# Patient Record
Sex: Male | Born: 1940 | Race: White | Hispanic: No | Marital: Married | Smoking: Former smoker
Health system: Southern US, Community
[De-identification: ages and names within clinical notes are randomized; demographics above are authoritative.]

## PROBLEM LIST (undated history)

## (undated) DIAGNOSIS — E782 Mixed hyperlipidemia: Secondary | ICD-10-CM

## (undated) DIAGNOSIS — E079 Disorder of thyroid, unspecified: Secondary | ICD-10-CM

## (undated) DIAGNOSIS — E039 Hypothyroidism, unspecified: Secondary | ICD-10-CM

## (undated) DIAGNOSIS — I209 Angina pectoris, unspecified: Secondary | ICD-10-CM

## (undated) DIAGNOSIS — Z125 Encounter for screening for malignant neoplasm of prostate: Secondary | ICD-10-CM

## (undated) DIAGNOSIS — I10 Essential (primary) hypertension: Secondary | ICD-10-CM

## (undated) DIAGNOSIS — Z0000 Encounter for general adult medical examination without abnormal findings: Secondary | ICD-10-CM

## (undated) DIAGNOSIS — J208 Acute bronchitis due to other specified organisms: Secondary | ICD-10-CM

## (undated) DIAGNOSIS — G500 Trigeminal neuralgia: Secondary | ICD-10-CM

## (undated) DIAGNOSIS — I25119 Atherosclerotic heart disease of native coronary artery with unspecified angina pectoris: Secondary | ICD-10-CM

## (undated) DIAGNOSIS — G250 Essential tremor: Secondary | ICD-10-CM

## (undated) DIAGNOSIS — J439 Emphysema, unspecified: Secondary | ICD-10-CM

## (undated) DIAGNOSIS — E785 Hyperlipidemia, unspecified: Secondary | ICD-10-CM

## (undated) DIAGNOSIS — K589 Irritable bowel syndrome without diarrhea: Secondary | ICD-10-CM

## (undated) DIAGNOSIS — K219 Gastro-esophageal reflux disease without esophagitis: Secondary | ICD-10-CM

## (undated) DIAGNOSIS — I2510 Atherosclerotic heart disease of native coronary artery without angina pectoris: Secondary | ICD-10-CM

## (undated) DIAGNOSIS — I429 Cardiomyopathy, unspecified: Secondary | ICD-10-CM

## (undated) DIAGNOSIS — K2210 Ulcer of esophagus without bleeding: Secondary | ICD-10-CM

## (undated) DIAGNOSIS — I5042 Chronic combined systolic (congestive) and diastolic (congestive) heart failure: Secondary | ICD-10-CM

## (undated) DIAGNOSIS — R911 Solitary pulmonary nodule: Secondary | ICD-10-CM

## (undated) DIAGNOSIS — R05 Cough: Secondary | ICD-10-CM

## (undated) DIAGNOSIS — I110 Hypertensive heart disease with heart failure: Secondary | ICD-10-CM

## (undated) DIAGNOSIS — I712 Thoracic aortic aneurysm, without rupture: Secondary | ICD-10-CM

## (undated) DIAGNOSIS — I219 Acute myocardial infarction, unspecified: Secondary | ICD-10-CM

## (undated) DIAGNOSIS — J189 Pneumonia, unspecified organism: Secondary | ICD-10-CM

## (undated) HISTORY — DX: Atherosclerotic heart disease of native coronary artery with unspecified angina pectoris: I25.119

## (undated) HISTORY — DX: Hypothyroidism, unspecified: E03.9

## (undated) HISTORY — DX: Atherosclerotic heart disease of native coronary artery without angina pectoris: I25.10

## (undated) HISTORY — DX: Solitary pulmonary nodule: R91.1

## (undated) HISTORY — PX: TOTAL SHOULDER REPLACEMENT: SUR1217

## (undated) HISTORY — DX: Acute myocardial infarction, unspecified: I21.9

## (undated) HISTORY — DX: Cough: R05

## (undated) HISTORY — DX: Encounter for general adult medical examination without abnormal findings: Z00.00

## (undated) HISTORY — DX: Ulcer of esophagus without bleeding: K22.10

## (undated) HISTORY — DX: Irritable bowel syndrome without diarrhea: K58.9

## (undated) HISTORY — DX: Emphysema, unspecified: J43.9

## (undated) HISTORY — DX: Thoracic aortic aneurysm, without rupture: I71.2

## (undated) HISTORY — DX: Trigeminal neuralgia: G50.0

## (undated) HISTORY — DX: Hypertensive heart disease with heart failure: I11.0

## (undated) HISTORY — DX: Acute bronchitis due to other specified organisms: J20.8

## (undated) HISTORY — DX: Chronic combined systolic (congestive) and diastolic (congestive) heart failure: I50.42

## (undated) HISTORY — DX: Encounter for screening for malignant neoplasm of prostate: Z12.5

## (undated) HISTORY — PX: CHOLECYSTECTOMY: SHX55

## (undated) HISTORY — DX: Pneumonia, unspecified organism: J18.9

## (undated) HISTORY — DX: Angina pectoris, unspecified: I20.9

## (undated) HISTORY — DX: Cardiomyopathy, unspecified: I42.9

## (undated) HISTORY — PX: TOTAL KNEE ARTHROPLASTY: SHX125

## (undated) HISTORY — DX: Essential tremor: G25.0

## (undated) HISTORY — DX: Disorder of thyroid, unspecified: E07.9

## (undated) HISTORY — PX: CORONARY ANGIOPLASTY WITH STENT PLACEMENT: SHX49

## (undated) HISTORY — PX: CORONARY ARTERY BYPASS GRAFT: SHX141

## (undated) HISTORY — DX: Hyperlipidemia, unspecified: E78.5

## (undated) HISTORY — DX: Mixed hyperlipidemia: E78.2

---

## 2002-12-12 ENCOUNTER — Inpatient Hospital Stay (HOSPITAL_COMMUNITY): Admission: AD | Admit: 2002-12-12 | Discharge: 2002-12-13 | Payer: Self-pay | Admitting: Neurosurgery

## 2003-12-03 ENCOUNTER — Ambulatory Visit: Payer: Self-pay | Admitting: Family Medicine

## 2004-03-03 ENCOUNTER — Ambulatory Visit: Payer: Self-pay | Admitting: Family Medicine

## 2004-03-31 ENCOUNTER — Ambulatory Visit: Payer: Self-pay | Admitting: Family Medicine

## 2004-04-21 ENCOUNTER — Ambulatory Visit: Payer: Self-pay | Admitting: Family Medicine

## 2004-08-22 ENCOUNTER — Ambulatory Visit: Payer: Self-pay | Admitting: Family Medicine

## 2004-12-12 ENCOUNTER — Ambulatory Visit: Payer: Self-pay | Admitting: Family Medicine

## 2005-02-04 ENCOUNTER — Ambulatory Visit: Payer: Self-pay | Admitting: Family Medicine

## 2007-06-13 ENCOUNTER — Inpatient Hospital Stay (HOSPITAL_COMMUNITY): Admission: RE | Admit: 2007-06-13 | Discharge: 2007-06-15 | Payer: Self-pay | Admitting: Orthopedic Surgery

## 2007-06-20 ENCOUNTER — Emergency Department (HOSPITAL_COMMUNITY): Admission: EM | Admit: 2007-06-20 | Discharge: 2007-06-20 | Payer: Self-pay | Admitting: Emergency Medicine

## 2007-06-20 ENCOUNTER — Ambulatory Visit: Payer: Self-pay | Admitting: Vascular Surgery

## 2007-06-20 ENCOUNTER — Encounter (INDEPENDENT_AMBULATORY_CARE_PROVIDER_SITE_OTHER): Payer: Self-pay | Admitting: Emergency Medicine

## 2008-12-18 ENCOUNTER — Encounter: Admission: RE | Admit: 2008-12-18 | Discharge: 2008-12-18 | Payer: Self-pay | Admitting: Orthopedic Surgery

## 2009-02-08 ENCOUNTER — Inpatient Hospital Stay (HOSPITAL_COMMUNITY): Admission: RE | Admit: 2009-02-08 | Discharge: 2009-02-10 | Payer: Self-pay | Admitting: Orthopedic Surgery

## 2010-04-13 LAB — BASIC METABOLIC PANEL
BUN: 11 mg/dL (ref 6–23)
BUN: 16 mg/dL (ref 6–23)
BUN: 8 mg/dL (ref 6–23)
CO2: 25 mEq/L (ref 19–32)
CO2: 28 mEq/L (ref 19–32)
CO2: 24 mEq/L (ref 19–32)
Calcium: 8.2 mg/dL — ABNORMAL LOW (ref 8.4–10.5)
Calcium: 9.2 mg/dL (ref 8.4–10.5)
Calcium: 8.1 mg/dL — ABNORMAL LOW (ref 8.4–10.5)
Chloride: 105 mEq/L (ref 96–112)
Chloride: 105 mEq/L (ref 96–112)
Chloride: 106 mEq/L (ref 96–112)
Creatinine, Ser: 0.82 mg/dL (ref 0.4–1.5)
Creatinine, Ser: 0.96 mg/dL (ref 0.4–1.5)
Creatinine, Ser: 0.93 mg/dL (ref 0.4–1.5)
GFR calc Af Amer: >60 mL/min (ref >60)
GFR calc Af Amer: >60 mL/min (ref >60)
GFR calc Af Amer: >60 mL/min (ref >60)
GFR calc non Af Amer: >60 mL/min (ref >60)
GFR calc non Af Amer: >60 mL/min (ref >60)
GFR calc non Af Amer: >60 mL/min (ref >60)
Glucose, Bld: 109 mg/dL — ABNORMAL HIGH (ref 70–99)
Glucose, Bld: 138 mg/dL — ABNORMAL HIGH (ref 70–99)
Glucose, Bld: 146 mg/dL — ABNORMAL HIGH (ref 70–99)
Potassium: 3.8 mEq/L (ref 3.5–5.1)
Potassium: 4.3 mEq/L (ref 3.5–5.1)
Potassium: 3.5 mEq/L (ref 3.5–5.1)
Sodium: 137 mEq/L (ref 135–145)
Sodium: 139 mEq/L (ref 135–145)
Sodium: 136 mEq/L (ref 135–145)

## 2010-04-13 LAB — CBC
HCT: 34.1 % — ABNORMAL LOW (ref 39.0–52.0)
HCT: 45 % (ref 39.0–52.0)
HCT: 32.1 % — ABNORMAL LOW (ref 39.0–52.0)
Hemoglobin: 12.2 g/dL — ABNORMAL LOW (ref 13.0–17.0)
Hemoglobin: 15.9 g/dL (ref 13.0–17.0)
Hemoglobin: 11.5 g/dL — ABNORMAL LOW (ref 13.0–17.0)
MCHC: 35.2 g/dL (ref 30.0–36.0)
MCHC: 35.7 g/dL (ref 30.0–36.0)
MCHC: 35.8 g/dL (ref 30.0–36.0)
MCV: 96.2 fL (ref 78.0–100.0)
MCV: 96.4 fL (ref 78.0–100.0)
MCV: 95.7 fL (ref 78.0–100.0)
Platelets: 122 10*3/uL — ABNORMAL LOW (ref 150–400)
Platelets: 178 10*3/uL (ref 150–400)
Platelets: 114 10*3/uL — ABNORMAL LOW (ref 150–400)
RBC: 3.55 MIL/uL — ABNORMAL LOW (ref 4.22–5.81)
RBC: 4.67 MIL/uL (ref 4.22–5.81)
RBC: 3.36 MIL/uL — ABNORMAL LOW (ref 4.22–5.81)
RDW: 14.5 % (ref 11.5–15.5)
RDW: 14.5 % (ref 11.5–15.5)
RDW: 14.8 % (ref 11.5–15.5)
WBC: 5.9 10*3/uL (ref 4.0–10.5)
WBC: 7.9 10*3/uL (ref 4.0–10.5)
WBC: 7.4 10*3/uL (ref 4.0–10.5)

## 2010-04-13 LAB — URINALYSIS, ROUTINE W REFLEX MICROSCOPIC
Glucose, UA: NEGATIVE mg/dL
Hgb urine dipstick: NEGATIVE
Ketones, ur: NEGATIVE mg/dL
Nitrite: NEGATIVE
Protein, ur: NEGATIVE mg/dL
Specific Gravity, Urine: 1.029 (ref 1.005–1.030)
Urobilinogen, UA: 0.2 mg/dL (ref 0.0–1.0)
pH: 5.5 (ref 5.0–8.0)

## 2010-04-13 LAB — DIFFERENTIAL
Basophils Absolute: 0 10*3/uL (ref 0.0–0.1)
Basophils Relative: 1 % (ref 0–1)
Eosinophils Absolute: 0.3 10*3/uL (ref 0.0–0.7)
Eosinophils Relative: 6 % — ABNORMAL HIGH (ref 0–5)
Lymphocytes Relative: 21 % (ref 12–46)
Lymphs Abs: 1.2 10*3/uL (ref 0.7–4.0)
Monocytes Absolute: 0.7 10*3/uL (ref 0.1–1.0)
Monocytes Relative: 11 % (ref 3–12)
Neutro Abs: 3.7 10*3/uL (ref 1.7–7.7)
Neutrophils Relative %: 62 % (ref 43–77)

## 2010-04-13 LAB — PROTIME-INR
INR: 1.02 (ref 0.00–1.49)
Prothrombin Time: 13.3 seconds (ref 11.6–15.2)

## 2010-04-13 LAB — TYPE AND SCREEN
ABO/RH(D): A POS
Antibody Screen: NEGATIVE

## 2010-04-13 LAB — APTT: aPTT: 32 seconds (ref 24–37)

## 2010-06-10 NOTE — H&P (Signed)
NAME:  GUMARO, BRIGHTBILL NO.:  [id number]   MEDICAL RECORD NO.:  [id number]         PATIENT TYPE:  LINP   LOCATION:                               FACILITY:  [facility]   PHYSICIAN:  Madlyn Frankel. Charlann Boxer, M.D.  DATE OF BIRTH:  [birth year]   DATE OF ADMISSION:  06/13/2007  DATE OF DISCHARGE:                              HISTORY & PHYSICAL   PROCEDURE:  Left total knee arthroplasty.   CHIEF COMPLAINT:  Left knee pain.   HISTORY OF PRESENT ILLNESS:  Mr. Ian Malkin is a very pleasant 70 year old  male with a history of left knee pain secondary to osteoarthritis.  It  has been refractory to all conservative treatment, including anti-  inflammatories and cortisone injections.  He has diminished quality of  life.  He has been presurgically assessed and evaluated with full  cardiology workup by Dr. Norman Herrlich prior to surgery.   PAST MEDICAL HISTORY:  Significant for:  1. Osteoarthritis.  2. Congestive heart failure.  3. Coronary artery disease with bypass graft.  4. Gallbladder disease.  5. Hypothyroidism.   PAST SURGICAL HISTORY:  Coronary artery bypass graft in 1997.   FAMILY HISTORY:  Noncontributory.   SOCIAL HISTORY:  Married.  Primary caregiver will be his wife after  surgery.   DRUG ALLERGIES:  No known drug allergies.   MEDICATIONS CURRENTLY:  1. Altace 10 mg p.o. daily.  2. Coreg 25 mg 1/2 tablet daily.  3. Zocor 10 mg p.o. daily.  4. Synthroid 100 mcg 1 p.o. daily.  5. Klor-Con 20 mEq 2 p.o. daily.  6. Lasix 40 mg 1 p.o. daily.  7. Aspirin 81 mg 1 p.o. daily.  8. Protonix 40 mg 1 p.o. daily.  9. Celebrex 200 mg 1 p.o. b.i.d. x2 weeks postoperatively.   REVIEW OF SYSTEMS:  None other than HPI.   PHYSICAL EXAMINATION:  VITAL SIGNS:  This is a 6 foot, 3 inch, 215-pound  male.  His pulse is 72, respirations 18, blood pressure 128/82.  GENERAL:  Awake, alert and oriented, well-developed, well-nourished, in  no acute distress.  NECK:  Supple.  No carotid  bruits.  CHEST/LUNGS:  Clear to auscultation bilaterally.  BREASTS:  Deferred.  HEART:  Regular rate and rhythm.  S1 and S2 distinct.  ABDOMEN:  Soft, nontender, nondistended.  Bowel sounds present.  GENITOURINARY:  Deferred.  EXTREMITIES:  Comes out to full extension, flexes back to 120 degrees.  Dorsalis pedis pulse positive.  NEUROLOGIC:  Intact distal sensibilities.   Labs are pending.   EKG has a sinus rhythm with a remote history of anteroseptal infarct.   Chest x-ray is pending.   IMPRESSION:  Left knee osteoarthritis.   PLAN OF ACTION:  Left total knee arthroplasty, [facility], Jun 13, 2007, by surgeon Dr. Durene Romans.   Postoperative recommendations include potential for telemetry, cardiac  enzymes, and EKG on postop day #1.  If asymptomatic, treat  conservatively.  If need for cardiology consult, prefer [place].  Risks  and complications were discussed.  Questions were encouraged,  answered  and reviewed.   Postoperative medications were  provided at time of history and physical,  including Lovenox, Robaxin, iron, aspirin, Colace, MiraLax.  We also  provided Celebrex for perioperative use.     ______________________________  Yetta Glassman. [address], [place]      Madlyn Frankel. Charlann Boxer, M.D.  Electronically Signed    BLM/MEDQ  D:  06/08/2007  T:  06/08/2007  Job:  [id number]   cc:   Dina Rich  Fax: [phone]   Dr. Norman Herrlich

## 2010-06-10 NOTE — Op Note (Signed)
NAME:  Jacob Weber, Jacob Weber NO.:  [id number]   MEDICAL RECORD NO.:  [id number]          PATIENT TYPE:  INP   LOCATION:  0001                         FACILITY:  [facility]   PHYSICIAN:  Madlyn Frankel. Charlann Boxer, M.D.  DATE OF BIRTH:  March 25, 1940   DATE OF PROCEDURE:  06/13/2007  DATE OF DISCHARGE:                               OPERATIVE REPORT   PREOPERATIVE DIAGNOSIS:  Left knee osteoarthritis.   POSTOPERATIVE DIAGNOSIS:  Left knee osteoarthritis.   PROCEDURE:  Left total knee replacement.   COMPONENTS USED:  DePuy rotating platform, posterior stabilized knee  system, size 5 femur, 4 tibia, 12.5 insert and a 41 patellar button.   SURGEON:  Madlyn Frankel. Charlann Boxer, M.D.   ASSISTANT:  Surgical tech.   ANESTHESIA:  Duramorph spinal.   COMPLICATIONS:  None.   DRAINS:  One.   TOURNIQUET TIME:  51 minutes at 250 mmHg.   INDICATIONS:  Mr. Bazen is a 70 year old patient whom I have been  following for some time for bilateral, left greater than right, knee end-  stage osteoarthritis.  He had failed conservative measures and had  decreased quality of life and at this point wished to proceed with knee  replacement.  We reviewed the risks and benefits of the procedure, the  postoperative course and expectations for potential stiffness if therapy  is not carried through, in addition to the risks of infection, DVT,  component failure, need for revision surgery.  Consent was obtained.   PROCEDURE IN DETAIL:  The patient was brought to the operative theater.  Once adequate anesthesia and preoperative antibiotics, Ancef,  administered the patient was positioned supine with a left proximal  thigh tourniquet placed.  The left lower extremity was then prescrubbed  and prepped and draped in sterile fashion.  A near midline incision was  made to avoid the prominent tibial tubercle.  Median arthrotomy was then  created patella subluxation was carried out.  Following initial  debridement, I attended  to the patella, first precut measure was 26 mm.  I resected down to 14-15 mm, then used a 41 patellar button.  Lug holes  were drilled, a metal shim was placed to protect the patella from the  retractors.  Following further debride, attention was directed to the  femur.  Femoral drill was passed.  Canal irrigated to try to prevent fat  emboli.  Intramedullary rod was then passed.  At 5 degrees valgus I  resected 10 mm off the distal femur.  I then sized the femur to be a  size 5, it was slightly larger in the anterior-posterior dimension, then  a 5 pinned in that position.  Checked it with a crab claw to make sure  that there was no notching.  There was none present.  The anterior and  posterior chamfer cuts were then made based off the posterior condylar  axis which correlated to the perpendicular to Whiteside's line.   The final box cut was made based off the lateral aspect of distal femur.  Attention now directed to the tibia.  The tibia was subluxated  anteriorly, further meniscectomy  was carried out using extramedullary  guide, I resected 10 mm of bone off the proximal lateral tibia.  I  brought the knee out to extension with extension block and was happy the  knee came out to extension.   At this point, the final preparation of the tibia was carried out.  Based on the dimensions of the tibia, I used a size 4.  He had medial  tibial osteophyte and I ended up debriding this back, recontouring the  medial side and lateralizing the tibia.  This helped with overall  tracking in tibial position.  I pinned the tibial tray in place,  checking with alignment rod and satisfied that the cut was perpendicular  in both planes.  I then drilled and keel punched the tibia.  Trial  reduction was now carried out with a 5 femur, 10 mm insert, 4 tibia and  a 12.5 insert.  The knee came out to full extension with stable  ligaments from extension to flexion.  The patella tracked without any   application of pressure.  Trial components were removed.  Further  debridements carried out as necessary.  I injected the synovial capsule  layer with 60 mL of 25% Marcaine with epinephrine and 1 mL of Toradol.  I then irrigated the knee out with normal saline solution pulse lavage.  The final components were opened and cement mixed.   Final components were then cemented in position.  The knee was brought  to extension with 12.5 insert.  Extruded cement was removed.  Once  cement cured, I removed the remaining cement that was visualized.  The  final 12.5 insert was placed.  The knee was brought to extension.  The  knee reirrigated.  At this point, a medium Hemovac drain was placed  deep.  Tourniquet was let down at 51 minutes.  There was no significant  hemostasis required.  The knee was brought to flexion and with the knee  at 90 degrees of flexion, I reapproximated the extensor mechanism using  #1 Vicryl.  The remaining wound was closed in layers with 2-0 Vicryl and  running 4-0 Monocryl.  The knee was then cleaned, dried and  dressed  sterilely with Steri-Strips and a bulky sterile dressing.  He was  brought to the recovery room in stable condition, tolerating the  procedure well.      Madlyn Frankel Charlann Boxer, M.D.  Electronically Signed     MDO/MEDQ  D:  06/13/2007  T:  06/13/2007  Job:  [id number]

## 2010-06-13 NOTE — Discharge Summary (Signed)
NAME:  Jacob Weber, Jacob Weber NO.:  [id number]   MEDICAL RECORD NO.:  [id number]                   PATIENT TYPE:  INP   LOCATION:  3113                                 FACILITY:  [facility]   PHYSICIAN:  Danae Orleans. Venetia Maxon, M.D.               DATE OF BIRTH:  07-Mar-1940   DATE OF ADMISSION:  12/12/2002  DATE OF DISCHARGE:  12/13/2002                                 DISCHARGE SUMMARY   REASON FOR ADMISSION:  Subarachnoid hemorrhage, altered level of  consciousness, status post aortocoronary bypass, unspecified fall accident,  residence for institution.   FINAL DIAGNOSES:  1. Subarachnoid hemorrhage.  2. Altered level of consciousness.  3. Status post aortocoronary bypass.  4. Unspecified fall accident.  5. Residence for institution.   HISTORY OF PRESENT ILLNESS AND HOSPITAL COURSE:  Jacob Weber is a 70-year-  old man who fell and hit his head with a loss of consciousness.  He had a  small amount of subarachnoid blood on head CT.  Was transferred from  [facility] for further care.  He was observed in the hospital and was  doing well on February 12, 2003.  Head CT was stable, and he was discharged  hom at that point with instructions to follow up with me in my office in  four weeks.   DISCHARGE MEDICATIONS:  1. Regular medications.  2. Tylenol for headaches.   FINAL DIAGNOSES:  1. Subarachnoid hemorrhage.  2. Altered level of consciousness.  3. Status post aortocoronary bypass.  4. Unspecified fall accident.  5. Residence for institution.   CONDITION ON DISCHARGE:  Stable.                                                Danae Orleans. Venetia Maxon, M.D.    JDS/MEDQ  D:  02/19/2003  T:  02/20/2003  Job:  [id number]

## 2010-06-13 NOTE — Discharge Summary (Signed)
NAME:  Jacob Weber, Jacob Weber NO.:  [id number]   MEDICAL RECORD NO.:  [id number]          PATIENT TYPE:  EMS   LOCATION:  MINO                         FACILITY:  [facility]   PHYSICIAN:  Madlyn Frankel. Charlann Boxer, M.D.  DATE OF BIRTH:  [birth year]   DATE OF ADMISSION:  06/20/2007  DATE OF DISCHARGE:  06/20/2007                               DISCHARGE SUMMARY   ADMITTING DIAGNOSES:  1. Osteoarthritis.  2. Congestive heart failure.  3. Coronary artery disease with bypass graft.  4. Gallbladder disease.  5. Hypothyroidism.   DISCHARGE DIAGNOSIS:  1. Osteoarthritis.  2. Congestive heart failure.  3. Coronary artery disease with bypass graft.  4. Gallbladder disease.  5. Hypothyroidism.   HISTORY OF PRESENT ILLNESS:  Jacob Weber is a 70 year old male with  history of left knee pain secondary to osteoarthritis.  It is refractory  to all conservative treatment including oral anti-inflammatory and  cortisone injections.  Was pre-surgically assessed and evaluated with  full cardiology workup prior surgery by Dr. Norman Herrlich.   PROCEDURE:  Left total knee arthroplasty by surgeon Dr. Durene Romans.   CONSULTATIONS:  None.   LABS:  Preadmission CBC:  Hemoglobin and hematocrit respectively were  15.6 and 45.2+.  Platelets were 205,000.  At the time of discharge  hemoglobin 9.9, hematocrit 28, platelets 123,000.  White cell  differential within normal limits.  Coags all within normal limits.  Routine chemistry on admission:  Sodium 140, potassium 4.8, glucose 101,  creatinine 0.97.  Time of discharge:  Sodium 139, potassium 4.1, glucose  128, creatinine 0.87.  Kidney function GFR greater than 60 with calcium  8.2 at discharge.  UA was negative.   CARDIOLOGY:  Full cardiology workup by Dr. Dulce Sellar prior to surgery  showed sinus rhythm with remote anteroseptal infarction.   No chest x-ray found in chart.   HOSPITAL COURSE:  The patient admitted to hospital, underwent left total  knee  replacement by Dr. Charlann Boxer, admitted to orthopedic floor.  The patient  did well with physical therapy made immediately excellent progress.  He  remained afebrile throughout.  Hemodynamically stable throughout.  The  Hemovac was DC'd day 1.  Dressing was changed after day 1 with no  significant drainage from the wound.  He remained neurovascularly intact  and was able to do straight leg raise prior to discharge.  He was doing  excellent on day 2.  Had done physical therapy twice and wanted to go  ahead and be discharged and orthopedically and hemodynamically he was  stable enough to do this.   DISCHARGE DISPOSITION:  Discharged home in stable and improved  condition.   DISCHARGE DIET:  Regular.   DISCHARGE WOUND CARE:  Keep wound dry.   DISCHARGE PHYSICAL THERAPY:  Weightbearing as tolerated with the use of  rolling walker.  Range of motion 0-100 for 2 weeks 0-120 for 6 weeks.   DISCHARGE MEDICATIONS:  1. Lovenox 40 mg subcutaneous q. 24 hours times 11 days.  2. Enteric-coated aspirin 325 mg one p.o. daily after Lovenox      completed.  3. Robaxin 500 mg  p.o. q. 6 muscle spasm pain.  4. Iron 325 mg 1 p.o. t.i.d. x2 weeks.  5. Colace 100 mg p.o. b.i.d.  6. MiraLax 17 grams p.o. daily.  7. Celebrex 2 tablets p.o. b.i.d. x2 weeks postoperatively.  8. Vicodin 5/325 one to two p.o. q. 4 to 6 p.r.n. pain.  9. Simvastatin 10 mg 1 p.o. q.p.m.  10.Carvedilol 25 mg half tab p.o. q. a.m.  11.Ramipril 10 mg one p.o. q.p.m.  12.Potassium chloride 20 mEq one p.o. q. a.m.  13.Furosemide 40 mg one p.o. q. a.m.  14.Protonix 40 mg one p.o. q. a.m.  15.Synthroid 0.1 mg one p.o. q.p.m.   DISCHARGE FOLLOWUP:  Follow with Dr. Charlann Boxer at phone number [phone].     ______________________________  Yetta Glassman. [address], [place]      Madlyn Frankel. Charlann Boxer, M.D.  Electronically Signed    BLM/MEDQ  D:  07/19/2007  T:  07/19/2007  Job:  [id number]   cc:   Dina Rich  Fax: [phone]   Dr. Norman Herrlich

## 2010-06-13 NOTE — H&P (Signed)
NAME:  Jacob Weber, Jacob Weber NO.:  [id number]   MEDICAL RECORD NO.:  [id number]                   PATIENT TYPE:  INP   LOCATION:  3113                                 FACILITY:  [facility]   PHYSICIAN:  Danae Orleans. Venetia Maxon, M.D.               DATE OF BIRTH:  11-19-1940   DATE OF ADMISSION:  12/12/2002  DATE OF DISCHARGE:                                HISTORY & PHYSICAL   CHIEF COMPLAINT:  Subarachnoid hemorrhage.   HISTORY OF ILLNESS:  Jacob Weber is a 70 year old man, who fell and hit  his head when he was pushing his 71-year-old grandson on a tire swing and the  nylon rope broke.  He fell and struck the left side of his head in the  posterior auricular region.  He had a positive loss of consciousness.  He  was taken to [facility] where a head CT evaluation demonstrated a  small amount of subarachnoid blood without midline shift or significant mass  effect.  He was transferred from [facility] for further care.  He has  had no nausea, vomiting, weakness, numbness, seizures, neck pain.  He has  had a cold for two weeks and has been on two rounds of antibiotics without  resolution of his symptoms.   PAST MEDICAL HISTORY:  1. Coronary artery bypass graft in the past.  2. Hypothyroid.  3. Elevated cholesterol.   MEDICATIONS:  1. Aspirin 81 mg daily.  2. Synthroid 0.1 mg daily.  3. Altace 10 mg daily.  4. Zocor 10 mg daily.  5. Lexapro 10 mg daily.   ALLERGIES:  He has no known drug allergies.   PHYSICAL EXAMINATION:  VITAL SIGNS:  He has a blood pressure of 144/75,  pulse 88, respiratory rate 22, temperature 97.5.  GENERAL:  He is awake and fully oriented.  HEENT:  He has no neck pain or headache.  He has no bruising over his  calvarium.  His pupils are round and reactive to light.  Extraocular  movements are intact.  NEUROLOGIC:  He moves all extremities without any drift.  He has full  bilateral upper extremity strength and all motor groups  bilaterally  symmetric.  He has no numbness in his upper or lower extremities.  He has no  evidence of pathologic reflexes.  On remainder of his examination, he has no  carotid bruits.  CHEST:  Bilaterally rhonchorous breath sounds with upper airway noise.  HEART:  Regular rate and rhythm without murmur.  ABDOMEN:  Soft, nontender, active bowel sounds, no hepatosplenomegaly  appreciated.  EXTREMITIES:  Without edema, clubbing, or cyanosis.   IMPRESSION:  Jacob Weber is a 70 year old man, who had a small amount of  subarachnoid hemorrhage following a fall in which he struck his head and had  a positive loss of consciousness.  He has a normal neurologic examination.  He will be observed in the neurologic intensive care  unit and will have a  repeat head CT in the morning.  Get a chest x-ray to rule out pneumonia.                                                Danae Orleans. Venetia Maxon, M.D.    JDS/MEDQ  D:  12/12/2002  T:  12/12/2002  Job:  [id number]

## 2010-10-22 LAB — BASIC METABOLIC PANEL
BUN: 11
BUN: 9
CO2: 25
CO2: 25
Calcium: 7.6 — ABNORMAL LOW
Calcium: 8.2 — ABNORMAL LOW
Chloride: 100
Chloride: 109
Creatinine, Ser: 0.87
Creatinine, Ser: 0.89
GFR calc Af Amer: >60
GFR calc Af Amer: >60
GFR calc non Af Amer: >60
GFR calc non Af Amer: >60
Glucose, Bld: 128 — ABNORMAL HIGH
Glucose, Bld: 136 — ABNORMAL HIGH
Potassium: 4.1
Potassium: 4.1
Sodium: 135
Sodium: 139

## 2010-10-22 LAB — CBC
HCT: 28 — ABNORMAL LOW
HCT: 31.6 — ABNORMAL LOW
Hemoglobin: 11.1 — ABNORMAL LOW
Hemoglobin: 9.9 — ABNORMAL LOW
MCHC: 35.1
MCHC: 35.2
MCV: 95.3
MCV: 95.9
Platelets: 123 — ABNORMAL LOW
Platelets: 141 — ABNORMAL LOW
RBC: 2.92 — ABNORMAL LOW
RBC: 3.32 — ABNORMAL LOW
RDW: 14.1
RDW: 14.4
WBC: 6.7
WBC: 8.3

## 2010-10-22 LAB — D-DIMER, QUANTITATIVE: D-Dimer, Quant: 2.25 — ABNORMAL HIGH

## 2010-12-05 ENCOUNTER — Emergency Department (HOSPITAL_COMMUNITY): Payer: Medicare Other

## 2010-12-05 ENCOUNTER — Encounter: Payer: Self-pay | Admitting: *Deleted

## 2010-12-05 ENCOUNTER — Emergency Department (HOSPITAL_COMMUNITY)
Admission: EM | Admit: 2010-12-05 | Discharge: 2010-12-05 | Disposition: A | Discharge status: Home / Self Care | Payer: Medicare Other | Attending: Emergency Medicine | Admitting: Emergency Medicine

## 2010-12-05 DIAGNOSIS — S61209A Unspecified open wound of unspecified finger without damage to nail, initial encounter: Secondary | ICD-10-CM | POA: Insufficient documentation

## 2010-12-05 DIAGNOSIS — S61219A Laceration without foreign body of unspecified finger without damage to nail, initial encounter: Secondary | ICD-10-CM

## 2010-12-05 DIAGNOSIS — I2510 Atherosclerotic heart disease of native coronary artery without angina pectoris: Secondary | ICD-10-CM | POA: Insufficient documentation

## 2010-12-05 DIAGNOSIS — Z7982 Long term (current) use of aspirin: Secondary | ICD-10-CM | POA: Insufficient documentation

## 2010-12-05 DIAGNOSIS — I10 Essential (primary) hypertension: Secondary | ICD-10-CM | POA: Insufficient documentation

## 2010-12-05 DIAGNOSIS — IMO0002 Reserved for concepts with insufficient information to code with codable children: Secondary | ICD-10-CM | POA: Insufficient documentation

## 2010-12-05 DIAGNOSIS — W268XXA Contact with other sharp object(s), not elsewhere classified, initial encounter: Secondary | ICD-10-CM | POA: Insufficient documentation

## 2010-12-05 DIAGNOSIS — Z79899 Other long term (current) drug therapy: Secondary | ICD-10-CM | POA: Insufficient documentation

## 2010-12-05 DIAGNOSIS — E039 Hypothyroidism, unspecified: Secondary | ICD-10-CM | POA: Insufficient documentation

## 2010-12-05 DIAGNOSIS — E7800 Pure hypercholesterolemia, unspecified: Secondary | ICD-10-CM | POA: Insufficient documentation

## 2010-12-05 HISTORY — DX: Disorder of thyroid, unspecified: E07.9

## 2010-12-05 HISTORY — DX: Gastro-esophageal reflux disease without esophagitis: K21.9

## 2010-12-05 HISTORY — DX: Atherosclerotic heart disease of native coronary artery without angina pectoris: I25.10

## 2010-12-05 HISTORY — DX: Essential (primary) hypertension: I10

## 2010-12-05 MED ORDER — LIDOCAINE HCL (PF) 1 % IJ SOLN
INTRAMUSCULAR | Status: AC
  Filled 2010-12-05: qty 10

## 2010-12-05 MED ORDER — BUPIVACAINE HCL (PF) 0.5 % IJ SOLN
INTRAMUSCULAR | Status: AC
  Filled 2010-12-05: qty 20

## 2010-12-05 NOTE — ED Notes (Signed)
Spoke with dr Sheran Fava staff and they aware pt is here and family and pt has been updated. They will call me back.

## 2010-12-05 NOTE — ED Notes (Signed)
PT cut R 4th and 5th digits today at noon.  Pt went to ED in [place] and they stated that he had a cut tendon and needed to see Hand specialist here at [facility].  Family has been in waiting room since 14:30 and are very frustrated.  Dressing to R hand will not be removed until specialist arrives. Pt denies pain and dressing dry and intact. PA Lemont Fillers has called Dr Sheran Fava and he is here in ED attending to another pt and he will see pt soon.  Family notifed.

## 2010-12-05 NOTE — ED Notes (Signed)
Spoke with dr Sheran Fava secretary, states he is sorry for delay and will see pt when he is out of surgery, will still attempt to have edp eval

## 2010-12-05 NOTE — ED Provider Notes (Signed)
8:40 PM Pt medically screened by me in ED. Pt already seen at urgent care and was sent here to be seen by Dr. Merlyn Lot. Pt with laceration to the right 4th and 5th fingers on a airconditioning unit. Suspected tendon laceration. Pt reports no current pain. Dr. Merlyn Lot already called and is here to see pt.   Pt in NAD, VS all within normal except for slightly elevated BP of 147/82. Regular HR and rhythm, lungs clear bilat. Right hand in bandage. Hemostatic. Pt asked for bandage not to be removed until Dr. Merlyn Lot removes it. Normal sensation to the tips of 4th and 5th fingers, good cap refill <2 sec.  Dr. Merlyn Lot is by bedside  Lottie Mussel, PA 12/05/10 2040  10:09 PM Pt's laceration and tendon injury repaired by Dr. Merlyn Lot, who asked for ortho techs to apply splint and d/c out of the system. Prescriptions and instructions for follow up given to the pt by him.  Lottie Mussel, [place] 12/05/10 2209

## 2010-12-05 NOTE — ED Notes (Signed)
Dr Sheran Fava at bedside.

## 2010-12-05 NOTE — ED Notes (Signed)
Dr Helene Kelp suturing wound.

## 2010-12-05 NOTE — ED Notes (Signed)
Pt reports laceration to right pinky and 4th finger by air conditioner cooler. Pt was sent to the ED by Dr Sheran Fava office to get started. Reports that Dr Merlyn Lot is coming to the ED and would like pt to have work up started in the ED.fingers are dressed at this time, pt states they told him his tendons were cut. Denies any decrease in sensation.

## 2010-12-05 NOTE — Consult Note (Signed)
NAMEMarland Kitchen  PEIGHTON, EDGIN NO.:  [id number]  MEDICAL RECORD NO.:  [id number]  LOCATION:  CDU07                        FACILITY:  [facility]  PHYSICIAN:  Betha Loa, MD        DATE OF BIRTH:  1940/12/03  DATE OF CONSULTATION:  12/05/2010 DATE OF DISCHARGE:  12/05/2010                                CONSULTATION   Consult is from emergency department.  Consult is for right small finger laceration with tendon involvement.  HISTORY:  Mr. Serviss is a 70 year old right-hand dominant male.  He is here with 2 family members.  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  He states this just an abrasion to the ring finger.  He went to an urgent care facility where the wound was irrigated and a dressing placed.  He was referred to me for further care.  He reports no previous injuries to the hand and no other injuries at this time.  ALLERGIES:  No known drug allergies.  PAST MEDICAL HISTORY:  Coronary artery disease, hypercholesterolemia, hypertension, hypothyroid.  PAST SURGICAL HISTORY:  Right total shoulder arthroplasty x2, left total knee arthroplasty, hernia repair, coronary artery bypass grafting.  MEDICATIONS: 1. Aspirin 81 mg. 2. Carvedilol. 3. Potassium. 4. Protonix. 5. Furosemide. 6. Synthroid. 7. Ramipril. 8. Crestor.  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  He does not use tobacco and does not use alcohol.  REVIEW OF SYSTEMS:  A 13-point review of systems negative.  PHYSICAL EXAMINATION:  GENERAL:  Alert and oriented x3, well developed, well nourished, normal gait. EXTREMITIES:  Bilateral upper extremities are intact to light touch Sensation and capillary refill to all fingertips.  He can flex and extend the IP joint of his thumbs and can cross his fingers.  Left upper  extremity is without wounds, without tenderness to palpation.  Right upper extremity with the exception of the ring and small finger have no wounds.  No tenderness to palpation.  In the ring finger, there is a small abrasion on the ulnar side of the finger.  This does not go into the subcutaneous tissues.  In the small finger there is a laceration with some skin loss on the dorsal aspect of the finger at the PIP joint and just proximally.  There is visible tendon laceration within it.  He has full extension of the finger and can maintain this against resistance.  He has intact flexion of the finger.  He has intact sensation on both the radial and ulnar sides.  RADIOGRAPHS:  AP, lateral, oblique views of the right hand show no fractures, dislocations, radiopaque foreign bodies.  ASSESSMENT AND PLAN:  Right ring and small finger lacerations with small finger extensor tendon involvement.  I discussed with Mr. Barro the nature of the injury.  I recommended irrigation and debridement of the wound and repair of the extensor tendon laceration in the emergency department.  Risks, benefits and alternatives of procedure were discussed including the risk of blood loss, infection, damage to  nerves, vessels, tendons, ligaments, bone, failure of procedure, need for additional procedures, complications, wound healing, continued pain, and stiffness.  He voiced understanding these risks and elected to proceed.  PROCEDURE NOTE:  Digital block was performed with 10 mL of half and half solution of 2% plain lidocaine and 0.5% plain Marcaine.  This was adequate to give total digital anesthesia to the small finger.  The wound was copiously irrigated with a 1000 mL of sterile saline and Betadine solution.  There was no gross contamination.  The finger was prepped with Betadine and sterilely draped with towels.  The wound was explored.  There is tendon laceration at the central slip.  This is a partial  injury.  On the more radial side, there was a small piece of bone from the base of the middle phalanx.  There were also some longitudinal lacerations at the ulnar side of the lateral bands.  The tendon lacerations were repaired with 4-0 Ethibond suture.  The small avulsion fragment was able to be tacked down while repairing the tendon. The skin was repaired using 4-0 nylon in an interrupted fashion.  There was some skin loss but the skin edges were able to be brought together. There was multiple laceration lines.  A Penrose drain was used as a tourniquet was up for approximately 30 minutes.  The wound was then dressed with sterile Xeroform.  The wound on the ring finger was then dressed with sterile Xeroform as well.  They were then dressed with sterile 2 x 2 and wrapped with a Kling bandage.  A volar splint was placed with the fingers in extension.  This was wrapped with Ace bandage.  Fingertips were pink with brisk capillary refill after placement of splint.  The Penrose drain had been removed.  I will give him Norco 5/325 1-2 p.o. q.6 hours p.r.n. pain, dispense #40 and Keflex 500 mg t.i.d. x1 week.  I will see him back in the office in 1 week for postprocedure followup.  Procedure was tolerated well.     Betha Loa, MD     KK/MEDQ  D:  12/05/2010  T:  12/05/2010  Job:  [id number]

## 2010-12-05 NOTE — Progress Notes (Signed)
Orthopedic Tech Progress Note Patient Details:  Jacob Weber September 19, 1940 [id number]  Type of Splint: Short Arm Splint Location: (R) UE Splint Interventions: Application    Jennye Moccasin 12/05/2010, 10:23 PM

## 2010-12-06 NOTE — ED Provider Notes (Signed)
Evaluation and management procedures were performed by the PA/NP under my supervision/collaboration.   Dione Booze, MD 12/06/10 [phone]

## 2013-04-11 IMAGING — CR DG HAND COMPLETE 3+V*R*
3 series · 3 of 3 positions shown · non-contrast
Comparison: None.

CLINICAL DATA: Fifth finger laceration.

RIGHT HAND - COMPLETE 3+ VIEW

[x hand pa right]
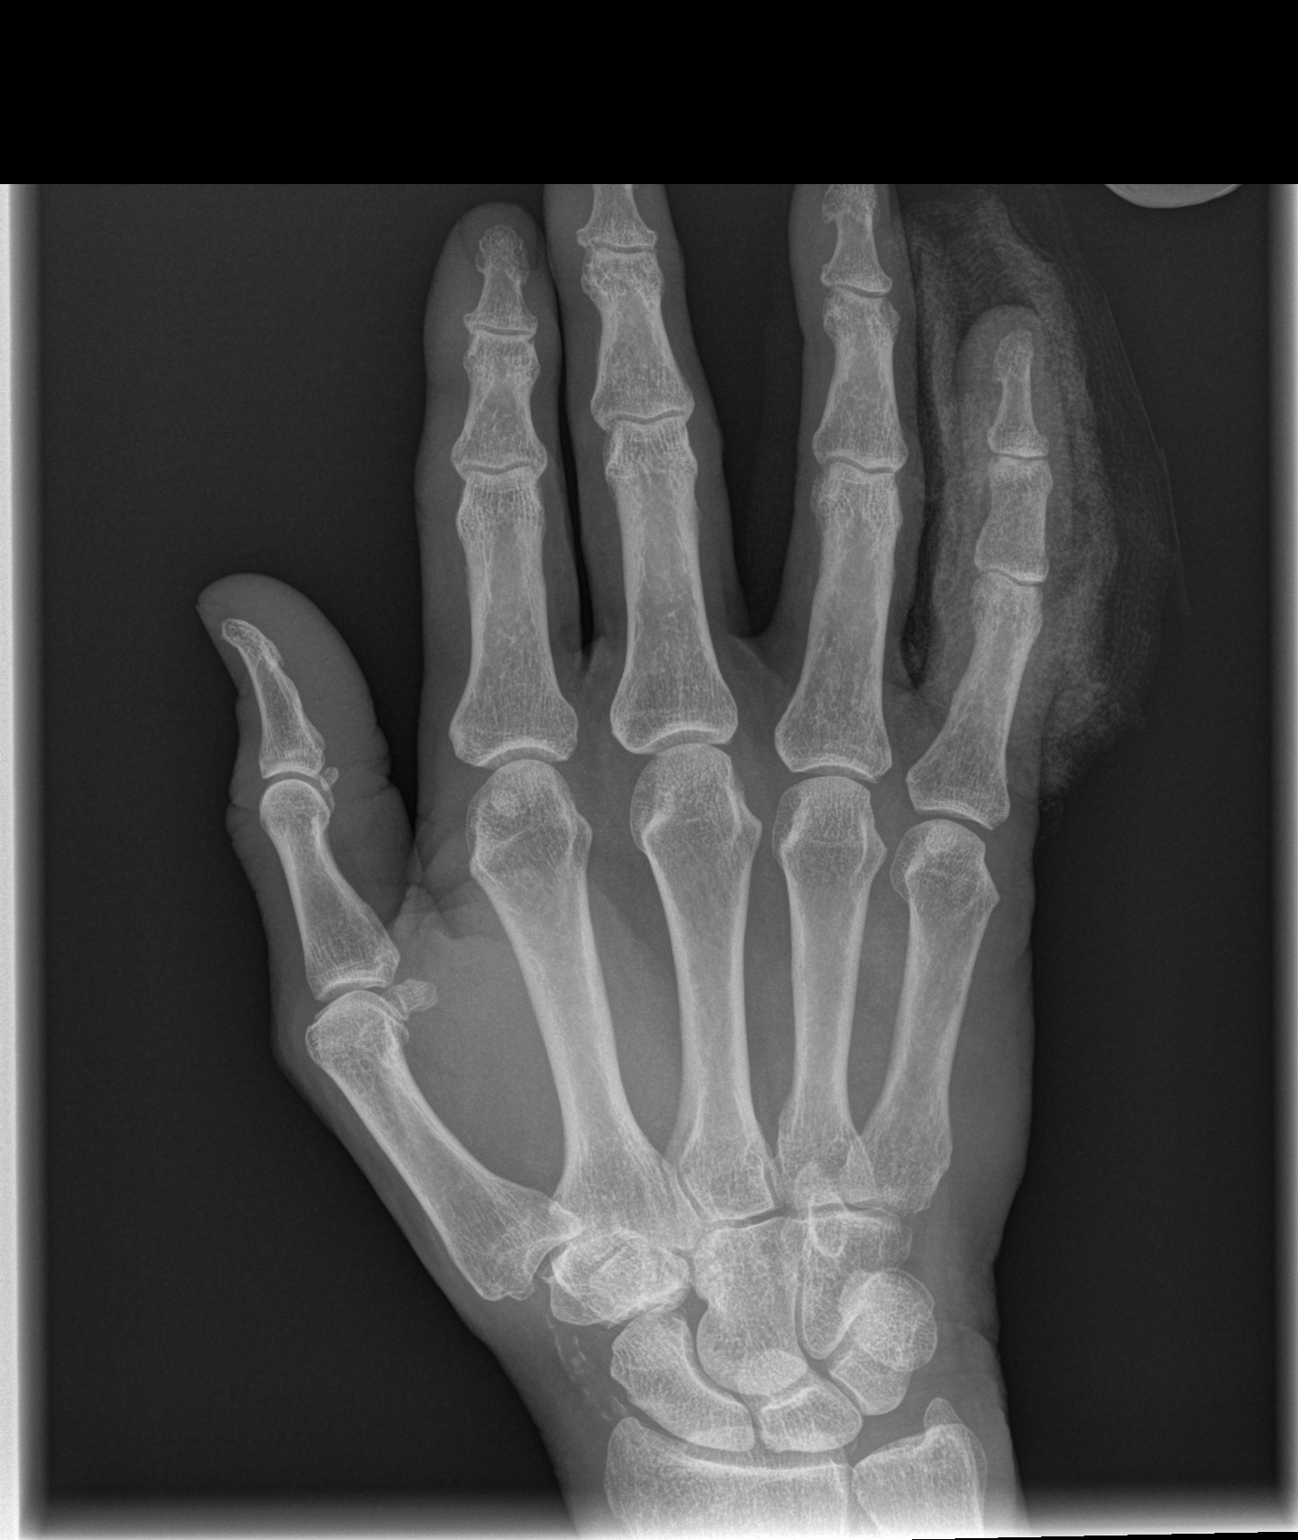

[x hand oblique right]
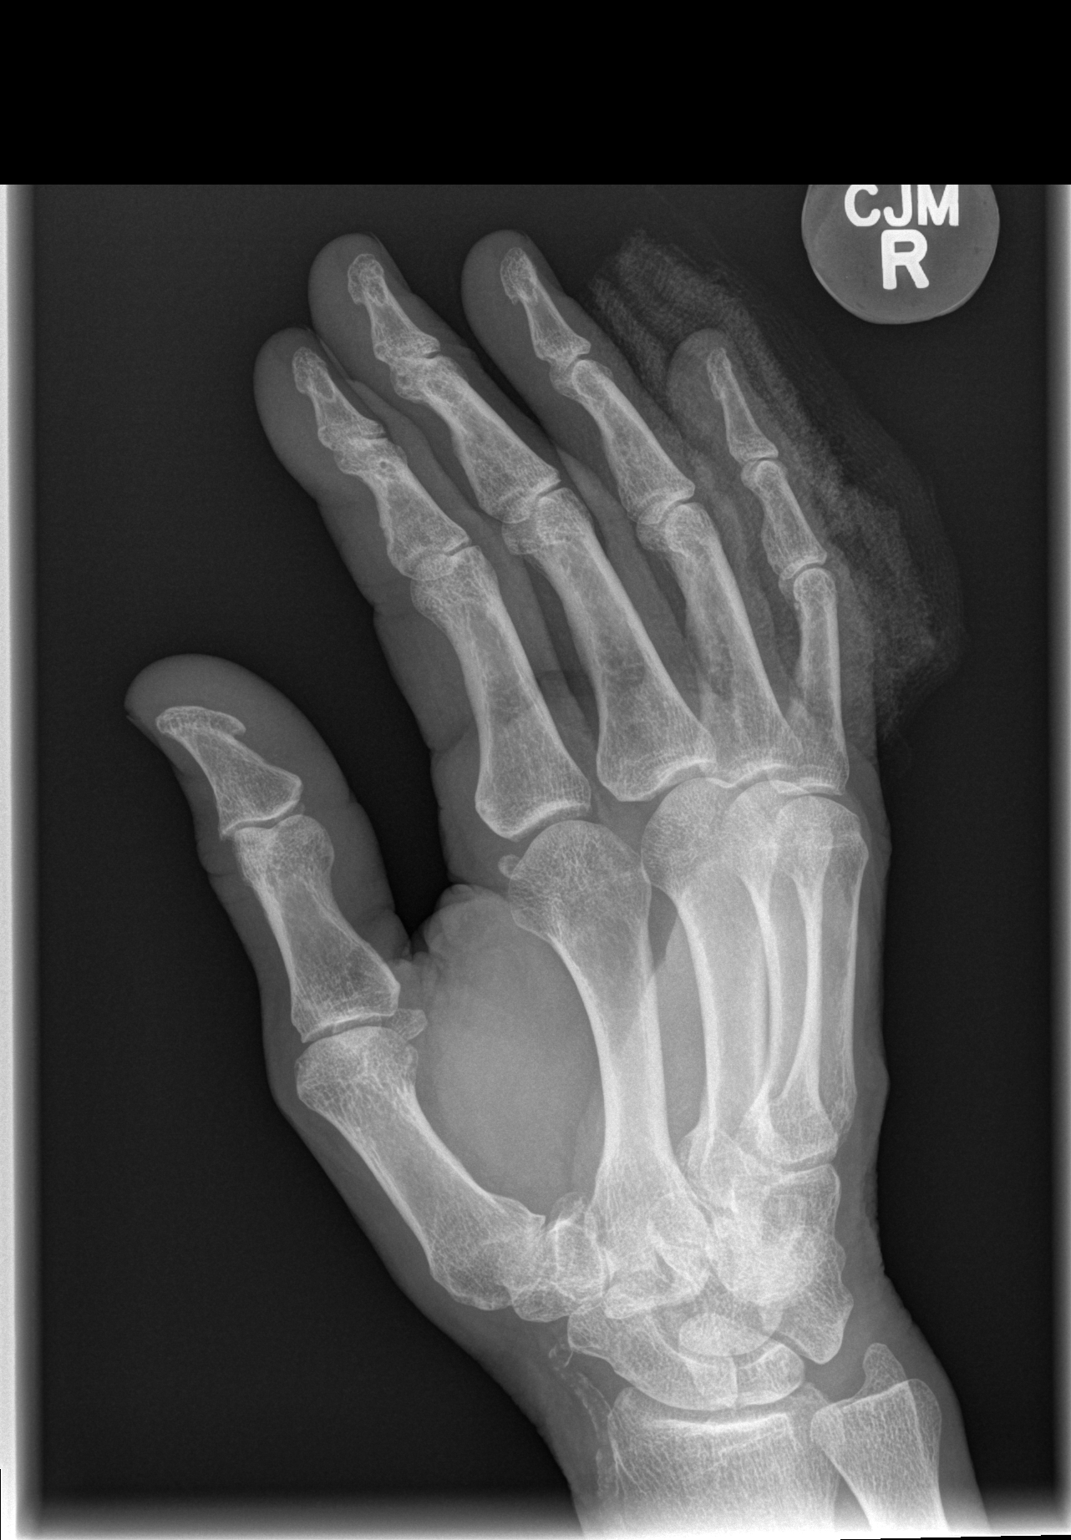

[x hand lat right]
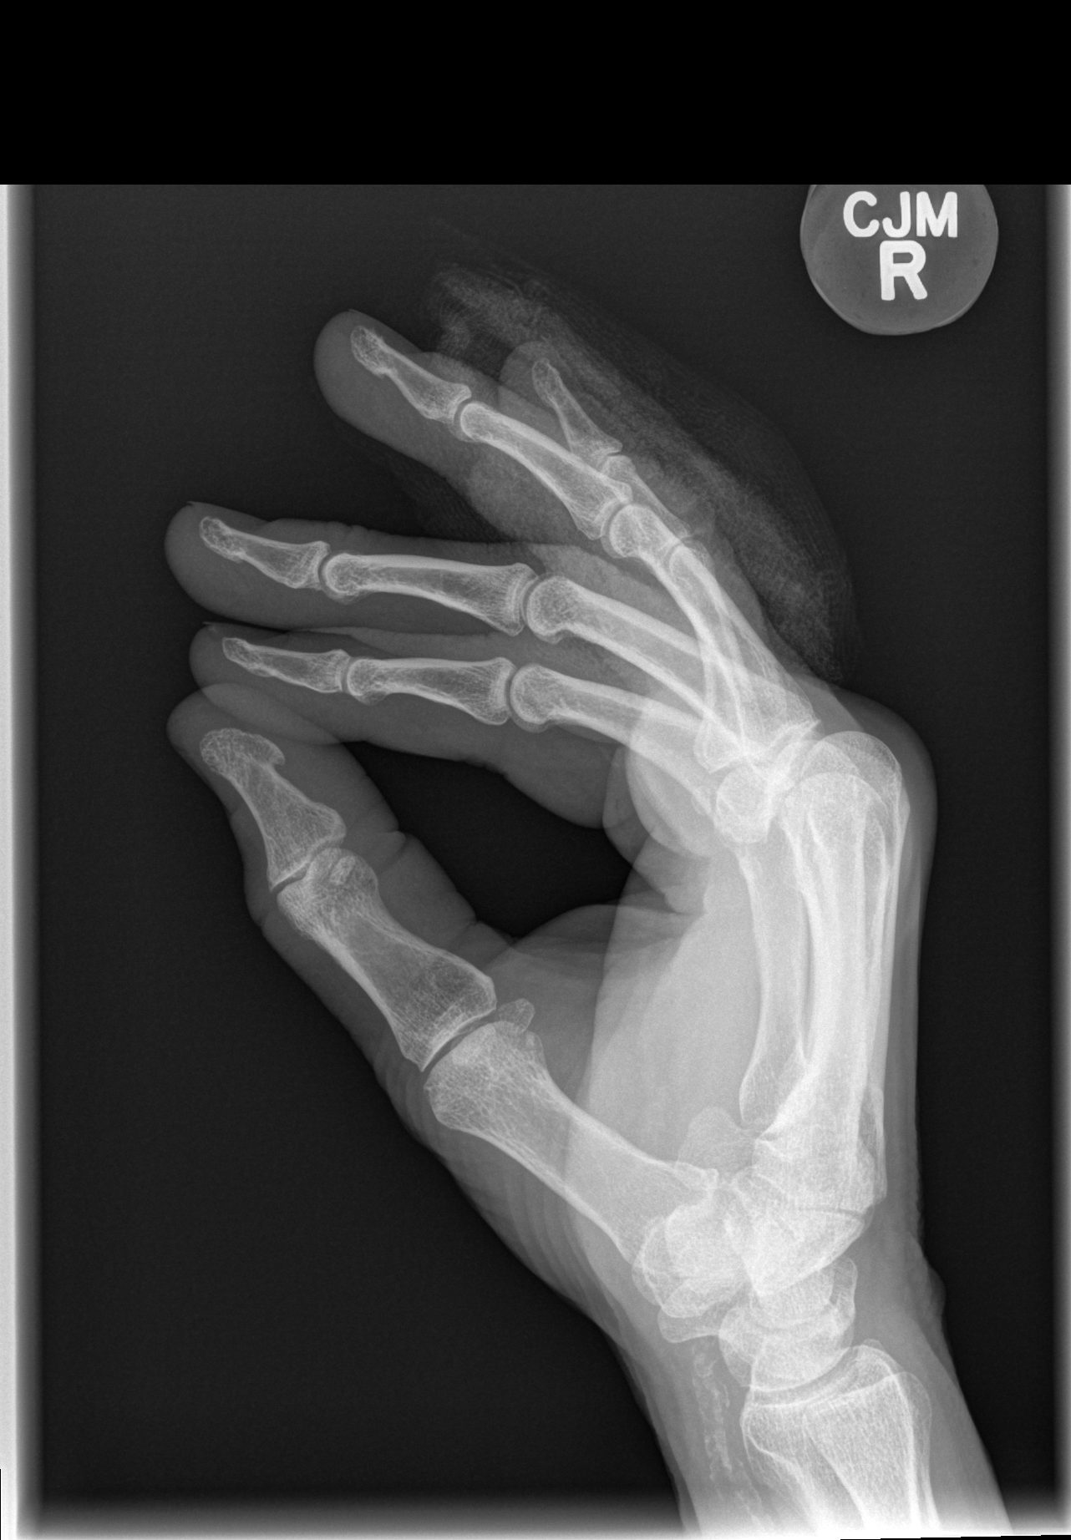

[3 of 3 positions shown; findings below may reference images not displayed]

FINDINGS: No evidence for acute fracture.  Fine detail of the
little fingers obscured by overlying bandage material.  No definite
radiopaque retained soft tissue foreign bodies evident.
IMPRESSION: No acute bony findings.

## 2014-12-04 DIAGNOSIS — I429 Cardiomyopathy, unspecified: Secondary | ICD-10-CM | POA: Insufficient documentation

## 2014-12-04 DIAGNOSIS — I5042 Chronic combined systolic (congestive) and diastolic (congestive) heart failure: Secondary | ICD-10-CM | POA: Insufficient documentation

## 2014-12-04 DIAGNOSIS — E785 Hyperlipidemia, unspecified: Secondary | ICD-10-CM | POA: Insufficient documentation

## 2014-12-04 DIAGNOSIS — I2510 Atherosclerotic heart disease of native coronary artery without angina pectoris: Secondary | ICD-10-CM | POA: Insufficient documentation

## 2014-12-04 DIAGNOSIS — E782 Mixed hyperlipidemia: Secondary | ICD-10-CM | POA: Insufficient documentation

## 2014-12-04 DIAGNOSIS — I110 Hypertensive heart disease with heart failure: Secondary | ICD-10-CM | POA: Insufficient documentation

## 2014-12-04 HISTORY — DX: Hyperlipidemia, unspecified: E78.5

## 2014-12-04 HISTORY — DX: Hypertensive heart disease with heart failure: I11.0

## 2014-12-04 HISTORY — DX: Atherosclerotic heart disease of native coronary artery without angina pectoris: I25.10

## 2014-12-04 HISTORY — DX: Mixed hyperlipidemia: E78.2

## 2014-12-04 HISTORY — DX: Chronic combined systolic (congestive) and diastolic (congestive) heart failure: I50.42

## 2014-12-04 HISTORY — DX: Cardiomyopathy, unspecified: I42.9

## 2015-02-28 DIAGNOSIS — K589 Irritable bowel syndrome without diarrhea: Secondary | ICD-10-CM

## 2015-02-28 HISTORY — DX: Irritable bowel syndrome, unspecified: K58.9

## 2015-03-13 DIAGNOSIS — E039 Hypothyroidism, unspecified: Secondary | ICD-10-CM | POA: Insufficient documentation

## 2015-03-13 HISTORY — DX: Hypothyroidism, unspecified: E03.9

## 2015-04-11 DIAGNOSIS — Z0000 Encounter for general adult medical examination without abnormal findings: Secondary | ICD-10-CM | POA: Insufficient documentation

## 2015-04-11 HISTORY — DX: Encounter for general adult medical examination without abnormal findings: Z00.00

## 2015-04-12 DIAGNOSIS — Z125 Encounter for screening for malignant neoplasm of prostate: Secondary | ICD-10-CM | POA: Insufficient documentation

## 2015-04-12 HISTORY — DX: Encounter for screening for malignant neoplasm of prostate: Z12.5

## 2016-03-19 DIAGNOSIS — K56600 Partial intestinal obstruction, unspecified as to cause: Secondary | ICD-10-CM

## 2016-03-19 DIAGNOSIS — E860 Dehydration: Secondary | ICD-10-CM | POA: Diagnosis not present

## 2016-03-19 DIAGNOSIS — R109 Unspecified abdominal pain: Secondary | ICD-10-CM | POA: Diagnosis not present

## 2016-03-19 DIAGNOSIS — I2510 Atherosclerotic heart disease of native coronary artery without angina pectoris: Secondary | ICD-10-CM | POA: Diagnosis not present

## 2016-03-20 DIAGNOSIS — K56600 Partial intestinal obstruction, unspecified as to cause: Secondary | ICD-10-CM | POA: Diagnosis not present

## 2016-03-20 DIAGNOSIS — E860 Dehydration: Secondary | ICD-10-CM | POA: Diagnosis not present

## 2016-03-20 DIAGNOSIS — I2510 Atherosclerotic heart disease of native coronary artery without angina pectoris: Secondary | ICD-10-CM | POA: Diagnosis not present

## 2016-03-20 DIAGNOSIS — R109 Unspecified abdominal pain: Secondary | ICD-10-CM | POA: Diagnosis not present

## 2016-03-29 DIAGNOSIS — K2210 Ulcer of esophagus without bleeding: Secondary | ICD-10-CM

## 2016-03-29 DIAGNOSIS — G250 Essential tremor: Secondary | ICD-10-CM | POA: Insufficient documentation

## 2016-03-29 DIAGNOSIS — I209 Angina pectoris, unspecified: Secondary | ICD-10-CM | POA: Insufficient documentation

## 2016-03-29 DIAGNOSIS — G500 Trigeminal neuralgia: Secondary | ICD-10-CM

## 2016-03-29 DIAGNOSIS — K219 Gastro-esophageal reflux disease without esophagitis: Secondary | ICD-10-CM | POA: Insufficient documentation

## 2016-03-29 HISTORY — DX: Ulcer of esophagus without bleeding: K22.10

## 2016-03-29 HISTORY — DX: Angina pectoris, unspecified: I20.9

## 2016-03-29 HISTORY — DX: Trigeminal neuralgia: G50.0

## 2016-03-29 HISTORY — DX: Essential tremor: G25.0

## 2016-04-22 DIAGNOSIS — J208 Acute bronchitis due to other specified organisms: Secondary | ICD-10-CM

## 2016-04-22 HISTORY — DX: Acute bronchitis due to other specified organisms: J20.8

## 2016-06-10 DIAGNOSIS — J984 Other disorders of lung: Secondary | ICD-10-CM | POA: Insufficient documentation

## 2016-06-10 DIAGNOSIS — J189 Pneumonia, unspecified organism: Secondary | ICD-10-CM

## 2016-06-10 HISTORY — DX: Other disorders of lung: J98.4

## 2016-06-10 HISTORY — DX: Pneumonia, unspecified organism: J18.9

## 2016-06-11 DIAGNOSIS — I7121 Aneurysm of the ascending aorta, without rupture: Secondary | ICD-10-CM | POA: Insufficient documentation

## 2016-06-11 DIAGNOSIS — I712 Thoracic aortic aneurysm, without rupture: Secondary | ICD-10-CM

## 2016-06-11 DIAGNOSIS — R911 Solitary pulmonary nodule: Secondary | ICD-10-CM

## 2016-06-11 DIAGNOSIS — J439 Emphysema, unspecified: Secondary | ICD-10-CM | POA: Insufficient documentation

## 2016-06-11 HISTORY — DX: Emphysema, unspecified: J43.9

## 2016-06-11 HISTORY — DX: Aneurysm of the ascending aorta, without rupture: I71.21

## 2016-06-11 HISTORY — DX: Thoracic aortic aneurysm, without rupture: I71.2

## 2016-06-11 HISTORY — DX: Solitary pulmonary nodule: R91.1

## 2016-06-23 DIAGNOSIS — R05 Cough: Secondary | ICD-10-CM | POA: Insufficient documentation

## 2016-06-23 DIAGNOSIS — R053 Chronic cough: Secondary | ICD-10-CM

## 2016-06-23 HISTORY — DX: Chronic cough: R05.3

## 2016-10-15 DIAGNOSIS — I219 Acute myocardial infarction, unspecified: Secondary | ICD-10-CM | POA: Insufficient documentation

## 2016-10-15 DIAGNOSIS — I110 Hypertensive heart disease with heart failure: Secondary | ICD-10-CM | POA: Insufficient documentation

## 2016-10-15 DIAGNOSIS — E079 Disorder of thyroid, unspecified: Secondary | ICD-10-CM | POA: Insufficient documentation

## 2016-10-15 DIAGNOSIS — I25119 Atherosclerotic heart disease of native coronary artery with unspecified angina pectoris: Secondary | ICD-10-CM | POA: Insufficient documentation

## 2016-10-15 HISTORY — DX: Acute myocardial infarction, unspecified: I21.9

## 2016-10-15 HISTORY — DX: Disorder of thyroid, unspecified: E07.9

## 2016-11-26 ENCOUNTER — Other Ambulatory Visit: Payer: Self-pay | Admitting: *Deleted

## 2016-12-21 ENCOUNTER — Ambulatory Visit (INDEPENDENT_AMBULATORY_CARE_PROVIDER_SITE_OTHER): Payer: Medicare Other | Admitting: Cardiology

## 2016-12-21 ENCOUNTER — Encounter: Payer: Self-pay | Admitting: Cardiology

## 2016-12-21 ENCOUNTER — Ambulatory Visit: Payer: Medicare Other | Admitting: Cardiology

## 2016-12-21 VITALS — BP 98/70 | HR 74 | Ht 74.5 in | Wt 200.0 lb

## 2016-12-21 DIAGNOSIS — E785 Hyperlipidemia, unspecified: Secondary | ICD-10-CM | POA: Diagnosis not present

## 2016-12-21 DIAGNOSIS — I5042 Chronic combined systolic (congestive) and diastolic (congestive) heart failure: Secondary | ICD-10-CM | POA: Diagnosis not present

## 2016-12-21 DIAGNOSIS — I712 Thoracic aortic aneurysm, without rupture: Secondary | ICD-10-CM

## 2016-12-21 DIAGNOSIS — I209 Angina pectoris, unspecified: Secondary | ICD-10-CM | POA: Diagnosis not present

## 2016-12-21 DIAGNOSIS — I25119 Atherosclerotic heart disease of native coronary artery with unspecified angina pectoris: Secondary | ICD-10-CM | POA: Diagnosis not present

## 2016-12-21 DIAGNOSIS — I110 Hypertensive heart disease with heart failure: Secondary | ICD-10-CM

## 2016-12-21 DIAGNOSIS — I7121 Aneurysm of the ascending aorta, without rupture: Secondary | ICD-10-CM

## 2016-12-21 NOTE — Patient Instructions (Signed)
Medication Instructions:  Your physician recommends that you continue on your current medications as directed. Please refer to the Current Medication list given to you today.  Labwork: None  Testing/Procedures: Your physician has requested that you have an echocardiogram. Echocardiography is a painless test that uses sound waves to create images of your heart. It provides your doctor with information about the size and shape of your heart and how well your heart's chambers and valves are working. This procedure takes approximately one hour. There are no restrictions for this procedure.  Follow-Up: Your physician wants you to follow-up in: 8 months (July, 2019). You will receive a reminder letter in the mail two months in advance. If you don't receive a letter, please call our office to schedule the follow-up appointment.  Any Other Special Instructions Will Be Listed Below (If Applicable).     If you need a refill on your cardiac medications before your next appointment, please call your pharmacy.

## 2016-12-21 NOTE — Progress Notes (Signed)
Cardiology Office Note:    Date:  12/21/2016   ID:  Jacob Weber, DOB [birth year], MRN [id number]  PCP:  Algis Greenhouse, MD  Cardiologist:  Shirlee More, MD    Referring MD: Algis Greenhouse, MD    ASSESSMENT:    1. Chronic combined systolic and diastolic heart failure ([place])   2. Coronary artery disease involving native coronary artery of native heart with angina pectoris ([place])   3. Hypertensive heart disease with heart failure ([place])   4. Ascending aortic aneurysm ([place])   5. Angina pectoris ([place])   6. Hyperlipidemia, unspecified hyperlipidemia type    PLAN:    In order of problems listed above:  1. Stable appears compensated I asked him to recheck echocardiogram to look at ejection fraction disease no longer on ACE inhibitor I suspect most of his shortness of breath is due to COPD 2. Stable continue current medical treatment 3. Stable I would not restart a vasodilator with a marginal low normal blood pressure.  He will continue his ACE inhibitor and diuretic. 4. Stable managed by CT surgery 5. Stable no recent angina 6. Poorly controlled again we will not consider drug therapy for hyperlipidemia   Next appointment: July 2019   Medication Adjustments/Labs and Tests Ordered: Current medicines are reviewed at length with the patient today.  Concerns regarding medicines are outlined above.  No orders of the defined types were placed in this encounter.  No orders of the defined types were placed in this encounter.   Chief Complaint  Patient presents with  . Follow-up    History of Present Illness:    Jacob Weber is a 76 y.o. male with a hx of CAD, CHF last EF 58% , Dyslipidemia with statin intolerance , HTN, S/P CABG in 1997  last seen one year ago. In the interim he has been seen by pulmonary re COPD and lung nodule and CTS Dr Freda Munro for enlarged ascending thoracic aorta 42 mm . He is no longer on an ACEI. Compliance with diet, lifestyle and medications: Yes but  he does not weigh daily Overall he finds that he is more exertional shortness of breath and is having mild orthopnea but has no edema.  He has had no angina palpitations syncope or TIA. Past Medical History:  Diagnosis Date  . Acquired hypothyroidism 03/13/2015  . Acute bronchitis due to other specified organisms 04/22/2016  . Angina pectoris ([place]) 03/29/2016  . Ascending aortic aneurysm ([place]) 06/11/2016   Overview:  2018: CT 4.2 cm  . Cardiomyopathy, secondary ([place]) 12/04/2014  . Chronic combined systolic and diastolic heart failure ([place]) 12/04/2014  . Chronic coronary artery disease 12/04/2014   Overview:  1. MPS 08/11/2010 with EF 56% and mild apical and septal iscjhemia 2. Cath 03/17/2011 with CTO of LAD and RCA, LTA is patent to D, SVG is patent to the RCA, SVG to LAD is chronically occluded, EF 58%  . Chronic cough 06/23/2016   Overview:  Onset 2015.  CT chest mild changes.    Last Assessment & Plan:  Here for evaluation of his upper airway from pulmonology due to chronic cough. Recently had his PPI increased to twice a day and was started on an inhaler.  He has noticed improvement in his cough, but it still bothersome.  No ACE inhibitor.  Cough seems to be much worse in the spring. EXAM by indirect laryngoscopy shows normal true vocal cords move well on phonation.  3+ edema of the arytenoid. PLAN: Reassured  his larynx looks okay.  Mild arytenoid edema suggestive of reflux-induced irritation.  I agree with everything that has been done.  Let us see how he does with his new inhaler twice a day PPI therapy for the month of June.  If he still persists with a bothersome cough, he will call to set up allergy testing.  Call sooner if symptoms are worsening.  . Coronary artery disease   . Disease of thyroid gland 10/15/2016  . Emphysema of lung ([place]) 06/11/2016   Overview:  2018: mild changes on CT, remote cigs  . Esophagitis, erosive 03/29/2016   Overview:  (2012) DX/RX: EGD, CHRONIC PPI NO GI RECHECK;  .  Essential tremor 03/29/2016  . GERD (gastroesophageal reflux disease)   . Hyperlipidemia 12/04/2014  . Hypertension   . Hypertensive heart disease with heart failure ([place]) 12/04/2014  . Irritable bowel syndrome without diarrhea 02/28/2015  . MI (myocardial infarction) ([place]) 10/15/2016  . Mixed hyperlipidemia 12/04/2014  . Pneumonitis 06/10/2016   Overview:  2018  . Preventative health care 04/11/2015  . Screening for prostate cancer 04/12/2015   Overview:  2017: DRE declined  . Secondary cardiomyopathy ([place]) 12/04/2014  . Solitary pulmonary nodule 06/11/2016   Overview:  09/10/16: CT 9 mm LLL, stable on f/u - recheck February 2019  . Thyroid disease   . Trigeminal neuralgia 03/29/2016    Past Surgical History:  Procedure Laterality Date  . CHOLECYSTECTOMY    . CORONARY ANGIOPLASTY WITH STENT PLACEMENT    . CORONARY ARTERY BYPASS GRAFT  x 5  . TOTAL KNEE ARTHROPLASTY    . TOTAL SHOULDER REPLACEMENT      Current Medications: Current Meds  Medication Sig  . aspirin EC 81 MG tablet Take 81 mg by mouth daily.    . carvedilol (COREG) 25 MG tablet Take 12.5 mg by mouth 2 (two) times daily with a meal.   . dicyclomine (BENTYL) 10 MG capsule take 1 capsule by mouth four times a day if needed for INTESTINAL SPASMS  . furosemide (LASIX) 40 MG tablet Take 40 mg by mouth daily.  Marland Kitchen levothyroxine (SYNTHROID, LEVOTHROID) 100 MCG tablet Take 100 mcg by mouth daily.    . nitroGLYCERIN (NITROSTAT) 0.4 MG SL tablet Place 0.4 mg under the tongue.  . pantoprazole (PROTONIX) 40 MG tablet Take 40 mg by mouth daily.    . potassium chloride SA (K-DUR,KLOR-CON) 20 MEQ tablet Take 20 mEq by mouth daily.    Marland Kitchen SPIRIVA RESPIMAT 1.25 MCG/ACT AERS   . TRAVATAN Z 0.004 % SOLN ophthalmic solution      Allergies:   Ace inhibitors and Statins   Social History   Socioeconomic History  . Marital status: Married    Spouse name: None  . Number of children: None  . Years of education: None  . Highest education level:  None  Social Needs  . Financial resource strain: None  . Food insecurity - worry: None  . Food insecurity - inability: None  . Transportation needs - medical: None  . Transportation needs - non-medical: None  Occupational History  . None  Tobacco Use  . Smoking status: Former Smoker    Last attempt to quit: 12/05/1979    Years since quitting: 37.0  . Smokeless tobacco: Never Used  Substance and Sexual Activity  . Alcohol use: No  . Drug use: No  . Sexual activity: None  Other Topics Concern  . None  Social History Narrative  . None     Family History:  The patient's family history includes Asthma in his sister; CAD in his mother; Stroke in his father; Thyroid cancer in his sister. ROS:   Please see the history of present illness.    All other systems reviewed and are negative.  EKGs/Labs/Other Studies Reviewed:    The following studies were reviewed today:  EKG:  EKG ordered today.  The ekg ordered today demonstrates sinus rhythm first-degree AV block  Recent Labs: No results found for requested labs within last 8760 hours.  Recent Lipid Panel No results found for: CHOL, TRIG, HDL, CHOLHDL, VLDL, LDLCALC, LDLDIRECT  Physical Exam:    VS:  Ht 6' 2.5" (1.892 m)   Wt 200 lb (90.7 kg)   BMI 25.33 kg/m     Wt Readings from Last 3 Encounters:  12/21/16 200 lb (90.7 kg)     GEN:  Well nourished, well developed in no acute distress HEENT: Normal NECK: No JVD; No carotid bruits LYMPHATICS: No lymphadenopathy CARDIAC: RRR, no murmurs, rubs, gallops RESPIRATORY:  Clear to auscultation without rales, wheezing or rhonchi  ABDOMEN: Soft, non-tender, non-distended MUSCULOSKELETAL:  No edema; No deformity  SKIN: Warm and dry NEUROLOGIC:  Alert and oriented x 3 PSYCHIATRIC:  Normal affect    Signed, Shirlee More, MD  12/21/2016 9:51 AM    [place]

## 2016-12-22 ENCOUNTER — Ambulatory Visit (HOSPITAL_BASED_OUTPATIENT_CLINIC_OR_DEPARTMENT_OTHER)
Admission: RE | Admit: 2016-12-22 | Discharge: 2016-12-22 | Disposition: A | Discharge status: Home / Self Care | Payer: Medicare Other | Source: Ambulatory Visit | Attending: Cardiology | Admitting: Cardiology

## 2016-12-22 DIAGNOSIS — I080 Rheumatic disorders of both mitral and aortic valves: Secondary | ICD-10-CM | POA: Insufficient documentation

## 2016-12-22 DIAGNOSIS — I5042 Chronic combined systolic (congestive) and diastolic (congestive) heart failure: Secondary | ICD-10-CM | POA: Diagnosis present

## 2016-12-22 DIAGNOSIS — I25119 Atherosclerotic heart disease of native coronary artery with unspecified angina pectoris: Secondary | ICD-10-CM

## 2016-12-22 DIAGNOSIS — I77810 Thoracic aortic ectasia: Secondary | ICD-10-CM | POA: Diagnosis not present

## 2016-12-22 NOTE — Progress Notes (Signed)
Echocardiogram 2D Echocardiogram has been performed.  [place], [place] 12/22/2016, 2:14 PM

## 2016-12-23 ENCOUNTER — Telehealth: Payer: Self-pay

## 2016-12-23 MED ORDER — CANDESARTAN CILEXETIL 8 MG PO TABS: 8.0000 mg | ORAL_TABLET | Freq: Every day | ORAL | 0 refills | Status: DC

## 2016-12-23 NOTE — Telephone Encounter (Signed)
-----   Message from Richardo Priest, MD sent at 12/23/2016  1:25 PM EST ----- EF is mildly reduced, lets restart an ARB Atacand 8 mgm day, BP check in 2 weeks office

## 2016-12-23 NOTE — Telephone Encounter (Signed)
Patient informed of results of echo. Advised patient to start candesartan 8 mg daily. Prescription sent to [facility] in [place] per patient request. Advised patient to check blood pressure every day. Appointment for blood pressure check scheduled for 01/07/17 at 3:30 pm. Patient advised to bring home blood pressure checks with him to nurse visit. Patient verbalized understanding. No further questions.

## 2017-01-07 ENCOUNTER — Ambulatory Visit (INDEPENDENT_AMBULATORY_CARE_PROVIDER_SITE_OTHER): Payer: Medicare Other | Admitting: Cardiology

## 2017-01-07 DIAGNOSIS — I5042 Chronic combined systolic (congestive) and diastolic (congestive) heart failure: Secondary | ICD-10-CM | POA: Diagnosis not present

## 2017-01-07 MED ORDER — CANDESARTAN CILEXETIL 8 MG PO TABS: 8.0000 mg | ORAL_TABLET | Freq: Every day | ORAL | 1 refills | Status: DC

## 2017-01-07 NOTE — Patient Instructions (Signed)
Medication Instructions:  Your physician has recommended you make the following change in your medication:  CHANGE Do not take Candesartan when systolic (top number of blood pressure) is less than 100  Labwork: None  Testing/Procedures: None  Follow-Up: Your physician recommends that you schedule a follow-up appointment in: routine follow up  Any Other Special Instructions Will Be Listed Below (If Applicable).     If you need a refill on your cardiac medications before your next appointment, please call your pharmacy.   [place], RN, BSN

## 2017-01-07 NOTE — Progress Notes (Signed)
Patient came in for BP check after initiating candesartan. BP was 110/68 in the left arm. Patient reported feeling swimmy headed one day and that he had to cancel all of his daily activities due to this. Per Dr. Bettina Gavia patient is to take Candesartan only when systolic BP is over 162. Patient was instructed to do this and to ensure to take his BP daily before meds.

## 2017-01-13 ENCOUNTER — Other Ambulatory Visit: Payer: Self-pay | Admitting: Cardiology

## 2017-03-22 ENCOUNTER — Other Ambulatory Visit: Payer: Self-pay | Admitting: Cardiology

## 2017-05-18 DIAGNOSIS — B351 Tinea unguium: Secondary | ICD-10-CM

## 2017-05-18 HISTORY — DX: Tinea unguium: B35.1

## 2017-09-01 ENCOUNTER — Other Ambulatory Visit: Payer: Self-pay | Admitting: Cardiology

## 2017-09-18 NOTE — Progress Notes (Signed)
Cardiology Office Note:    Date:  09/20/2017   ID:  Jacob Weber, DOB 1940-11-23, MRN [id number]  PCP:  Algis Greenhouse, MD  Cardiologist:  Shirlee More, MD    Referring MD: Algis Greenhouse, MD    ASSESSMENT:    1. Coronary artery disease involving native coronary artery of native heart with angina pectoris ([place])   2. Chronic combined systolic and diastolic heart failure ([place])   3. Ascending aortic aneurysm ([place])   4. Mixed hyperlipidemia    PLAN:    In order of problems listed above:  1. Stable continue current medical treatment at this time I do not think he requires an ischemia evaluation 2. Stable compensated continue his current diuretic he is intolerant of ARB with hypotension 3. Check CTA regarding progression also with intermittent epigastric discomfort check duplex of abdominal aorta for abdominal aortic aneurysm 4. Poorly controlled intolerant of lipid-lowering therapy.  He does not want to pursue PC SK 9 agents   Next appointment: 6 months   Medication Adjustments/Labs and Tests Ordered: Current medicines are reviewed at length with the patient today.  Concerns regarding medicines are outlined above.  No orders of the defined types were placed in this encounter.  No orders of the defined types were placed in this encounter.   Chief Complaint  Patient presents with  . Coronary Artery Disease  . Congestive Heart Failure    History of Present Illness:    [name] is a 77 y.o. male with a hx of CAD, CHF last EF 58% , Dyslipidemia with statin intolerance , HTN, S/P CABG in 1997 as well as COPD lung nodule and dilated ascending thoracic aorta at 42 mm.  He was last seen 12/21/2016. Compliance with diet, lifestyle and medications: yes Overall is done well at times after activity he gets a pulsations abdominal aortic area although not palpable on physical examination.  He has aneurysm of the ascending aorta is at risk for abdominal aortic aneurysm left  duplex performed in follow-up CT of his chest.  He has had no angina dyspnea edema orthopnea palpitation or syncope.  Labs were done at the [facility] to be performed in the next week Past Medical History:  Diagnosis Date  . Acquired hypothyroidism 03/13/2015  . Acute bronchitis due to other specified organisms 04/22/2016  . Angina pectoris ([place]) 03/29/2016  . Ascending aortic aneurysm ([place]) 06/11/2016   Overview:  2018: CT 4.2 cm  . Cardiomyopathy, secondary ([place]) 12/04/2014  . Chronic combined systolic and diastolic heart failure ([place]) 12/04/2014  . Chronic coronary artery disease 12/04/2014   Overview:  1. MPS 08/11/2010 with EF 56% and mild apical and septal iscjhemia 2. Cath 03/17/2011 with CTO of LAD and RCA, LTA is patent to D, SVG is patent to the RCA, SVG to LAD is chronically occluded, EF 58%  . Chronic cough 06/23/2016   Overview:  Onset 2015.  CT chest mild changes.    Last Assessment & Plan:  Here for evaluation of his upper airway from pulmonology due to chronic cough. Recently had his PPI increased to twice a day and was started on an inhaler.  He has noticed improvement in his cough, but it still bothersome.  No ACE inhibitor.  Cough seems to be much worse in the spring. EXAM by indirect laryngoscopy shows normal true vocal cords move well on phonation.  3+ edema of the arytenoid. PLAN: Reassured his larynx looks okay.  Mild arytenoid edema suggestive of reflux-induced  irritation.  I agree with everything that has been done.  Let us see how he does with his new inhaler twice a day PPI therapy for the month of June.  If he still persists with a bothersome cough, he will call to set up allergy testing.  Call sooner if symptoms are worsening.  . Coronary artery disease   . Disease of thyroid gland 10/15/2016  . Emphysema of lung ([place]) 06/11/2016   Overview:  2018: mild changes on CT, remote cigs  . Esophagitis, erosive 03/29/2016   Overview:  (2012) DX/RX: EGD, CHRONIC PPI NO GI RECHECK;  .  Essential tremor 03/29/2016  . GERD (gastroesophageal reflux disease)   . Hyperlipidemia 12/04/2014  . Hypertension   . Hypertensive heart disease with heart failure ([place]) 12/04/2014  . Irritable bowel syndrome without diarrhea 02/28/2015  . MI (myocardial infarction) ([place]) 10/15/2016  . Mixed hyperlipidemia 12/04/2014  . Pneumonitis 06/10/2016   Overview:  2018  . Preventative health care 04/11/2015  . Screening for prostate cancer 04/12/2015   Overview:  2017: DRE declined  . Secondary cardiomyopathy ([place]) 12/04/2014  . Solitary pulmonary nodule 06/11/2016   Overview:  09/10/16: CT 9 mm LLL, stable on f/u - recheck February 2019  . Thyroid disease   . Trigeminal neuralgia 03/29/2016    Past Surgical History:  Procedure Laterality Date  . CHOLECYSTECTOMY    . CORONARY ANGIOPLASTY WITH STENT PLACEMENT    . CORONARY ARTERY BYPASS GRAFT  x 5  . TOTAL KNEE ARTHROPLASTY    . TOTAL SHOULDER REPLACEMENT      Current Medications: Current Meds  Medication Sig  . aspirin EC 81 MG tablet Take 81 mg by mouth daily.    . carvedilol (COREG) 25 MG tablet TAKE 1/2 TABLET BY MOUTH TWICE (2) DAILY  . dicyclomine (BENTYL) 10 MG capsule take 1 capsule by mouth four times a day if needed for INTESTINAL SPASMS  . furosemide (LASIX) 40 MG tablet take 1 tablet by mouth daily  . levothyroxine (SYNTHROID, LEVOTHROID) 100 MCG tablet Take 100 mcg by mouth daily.    . nitroGLYCERIN (NITROSTAT) 0.4 MG SL tablet Place 0.4 mg under the tongue every 5 (five) minutes as needed.   . pantoprazole (PROTONIX) 40 MG tablet Take 40 mg by mouth 2 (two) times daily.   . potassium chloride SA (K-DUR,KLOR-CON) 20 MEQ tablet take 1 tablet by mouth daily  . TRAVATAN Z 0.004 % SOLN ophthalmic solution Place 1 drop into both eyes at bedtime.      Allergies:   Ace inhibitors and Statins   Social History   Socioeconomic History  . Marital status: Married    Spouse name: Not on file  . Number of children: Not on file  . Years of  education: Not on file  . Highest education level: Not on file  Occupational History  . Not on file  Social Needs  . Financial resource strain: Not on file  . Food insecurity:    Worry: Not on file    Inability: Not on file  . Transportation needs:    Medical: Not on file    Non-medical: Not on file  Tobacco Use  . Smoking status: Former Smoker    Last attempt to quit: 12/05/1979    Years since quitting: 37.8  . Smokeless tobacco: Never Used  Substance and Sexual Activity  . Alcohol use: No  . Drug use: No  . Sexual activity: Not on file  Lifestyle  . Physical activity:  Days per week: Not on file    Minutes per session: Not on file  . Stress: Not on file  Relationships  . Social connections:    Talks on phone: Not on file    Gets together: Not on file    Attends religious service: Not on file    Active member of club or organization: Not on file    Attends meetings of clubs or organizations: Not on file    Relationship status: Not on file  Other Topics Concern  . Not on file  Social History Narrative  . Not on file     Family History: The patient's family history includes Asthma in his sister; CAD in his mother; Stroke in his father; Thyroid cancer in his sister. ROS:   Please see the history of present illness.    All other systems reviewed and are negative.  EKGs/Labs/Other Studies Reviewed:    The following studies were reviewed today Echocardiogram 12/22/2016 shows mild to moderate reduction left ventricular systolic function ejection fraction 40 to 45% and mild mitral regurgitation with mild enlargement of the ascending aorta 41 mm. Recent Labs:   03/17/2017 TSH is normal No results found for requested labs within last 8760 hours.  Recent Lipid Panel No results found for: CHOL, TRIG, HDL, CHOLHDL, VLDL, LDLCALC, LDLDIRECT  Physical Exam:    VS:  BP 110/74 (BP Location: Right Arm, Patient Position: Sitting, Cuff Size: Normal)   Pulse 85   Ht 6' 2.5"  (1.892 m)   Wt 197 lb 3.2 oz (89.4 kg)   SpO2 96%   BMI 24.98 kg/m     Wt Readings from Last 3 Encounters:  09/20/17 197 lb 3.2 oz (89.4 kg)  12/21/16 200 lb (90.7 kg)     GEN:  Well nourished, well developed in no acute distress HEENT: Normal NECK: No JVD; No carotid bruits LYMPHATICS: No lymphadenopathy CARDIAC: RRR, no murmurs, rubs, gallops RESPIRATORY:  Clear to auscultation without rales, wheezing or rhonchi  ABDOMEN: Soft, non-tender, non-distended MUSCULOSKELETAL:  No edema; No deformity  SKIN: Warm and dry NEUROLOGIC:  Alert and oriented x 3 PSYCHIATRIC:  Normal affect    Signed, Shirlee More, MD  09/20/2017 1:52 PM    [facility]

## 2017-09-20 ENCOUNTER — Encounter: Payer: Self-pay | Admitting: Cardiology

## 2017-09-20 ENCOUNTER — Ambulatory Visit (INDEPENDENT_AMBULATORY_CARE_PROVIDER_SITE_OTHER): Payer: Medicare Other | Admitting: Cardiology

## 2017-09-20 VITALS — BP 110/74 | HR 85 | Ht 74.5 in | Wt 197.2 lb

## 2017-09-20 DIAGNOSIS — I25119 Atherosclerotic heart disease of native coronary artery with unspecified angina pectoris: Secondary | ICD-10-CM | POA: Diagnosis not present

## 2017-09-20 DIAGNOSIS — I712 Thoracic aortic aneurysm, without rupture, unspecified: Secondary | ICD-10-CM

## 2017-09-20 DIAGNOSIS — I5042 Chronic combined systolic (congestive) and diastolic (congestive) heart failure: Secondary | ICD-10-CM | POA: Diagnosis not present

## 2017-09-20 DIAGNOSIS — I7121 Aneurysm of the ascending aorta, without rupture: Secondary | ICD-10-CM

## 2017-09-20 DIAGNOSIS — E782 Mixed hyperlipidemia: Secondary | ICD-10-CM

## 2017-09-20 NOTE — Patient Instructions (Addendum)
Medication Instructions:  Your physician recommends that you continue on your current medications as directed. Please refer to the Current Medication list given to you today.   Labwork: Please have labs drawn 2 days before CT is scheduled. (BMP)  Testing/Procedures: Your physician has requested that you have an abdominal aorta duplex. During this test, an ultrasound is used to evaluate the aorta. Allow 30 minutes for this exam. Do not eat after midnight the day before and avoid carbonated beverages  Non-Cardiac CT scanning, (CAT scanning), is a noninvasive, special x-ray that produces cross-sectional images of the body using x-rays and a computer. CT scans help physicians diagnose and treat medical conditions. For some CT exams, a contrast material is used to enhance visibility in the area of the body being studied. CT scans provide greater clarity and reveal more details than regular x-ray exams.    Follow-Up: Your physician wants you to follow-up in: 6 months. You will receive a reminder letter in the mail two months in advance. If you don't receive a letter, please call our office to schedule the follow-up appointment.   Any Other Special Instructions Will Be Listed Below (If Applicable).     If you need a refill on your cardiac medications before your next appointment, please call your pharmacy.   CT Scan A CT scan (computed tomography scan) is an imaging scan. It uses X-rays and a computer to make detailed pictures of different areas inside the body. A CT scan can give more information than a regular X-ray exam. A CT scan provides data about internal organs, soft tissue structures, blood vessels, and bones. In this procedure, the pictures will be taken in a large machine that has an opening (CT scanner). Tell a health care provider about:  Any allergies you have.  All medicines you are taking, including vitamins, herbs, eye drops, creams, and over-the-counter medicines.  Any  blood disorders you have.  Any surgeries you have had.  Any medical conditions you have.  Whether you are pregnant or may be pregnant. What are the risks? Generally, this is a safe procedure. However, problems may occur, including:  An allergic reaction to dyes.  Development of cancer from excessive exposure to radiation from multiple CT scans. This is rare.  What happens before the procedure? Staying hydrated Follow instructions from your health care provider about hydration, which may include:  Up to 2 hours before the procedure - you may continue to drink clear liquids, such as water, clear fruit juice, black coffee, and plain tea.  Eating and drinking restrictions Follow instructions from your health care provider about eating and drinking, which may include:  24 hours before the procedure - stop drinking caffeinated beverages, such as energy drinks, tea, soda, coffee, and hot chocolate.  8 hours before the procedure - stop eating heavy meals or foods such as meat, fried foods, or fatty foods.  6 hours before the procedure - stop eating light meals or foods, such as toast or cereal.  6 hours before the procedure - stop drinking milk or drinks that contain milk.  2 hours before the procedure - stop drinking clear liquids.  General instructions  Remove any jewelry.  Ask your health care provider about changing or stopping your regular medicines. This is especially important if you are taking diabetes medicines or blood thinners. What happens during the procedure?  You will lie on a table with your arms above your head.  An IV tube may be inserted into one of  your veins.  The contrast dye may be injected into the IV tube. You may feel warm or have a metallic taste in your mouth.  The table you will be lying on will move into the CT scanner.  You will be able to see, hear, and talk to the person running the machine while you are in it. Follow that person's  instructions.  The CT scanner will move around you to take pictures. Do not move while it is scanning. Staying still helps the scanner to get a good image.  When the best possible pictures have been taken, the machine will be turned off. The table will be moved out of the machine.  The IV tube will be removed. The procedure may vary among health care providers and hospitals. What happens after the procedure?  It is up to you to get the results of your procedure. Ask your health care provider, or the department that is doing the procedure, when your results will be ready. Summary  A CT scan is an imaging scan.  A CT scan uses X-rays and a computer to make detailed pictures of different areas of your body.  Follow instructions from your health care provider about eating and drinking before the procedure.  You will be able to see, hear, and talk to the person running the machine while you are in it. Follow that person's instructions. This information is not intended to replace advice given to you by your health care provider. Make sure you discuss any questions you have with your health care provider. Document Released: 02/20/2004 Document Revised: 02/15/2016 Document Reviewed: 02/15/2016 Elsevier Interactive Patient Education  2018 [place].   Abdominal Aortic Aneurysm Blood pumps away from the heart through tubes (blood vessels) called arteries. Aneurysms are weak or damaged places in the wall of an artery. It bulges out like a balloon. An abdominal aortic aneurysm happens in the main artery of the body (aorta). It can burst or tear, causing bleeding inside the body. This is an emergency. It needs treatment right away. What are the causes? The exact cause is unknown. Things that could cause this problem include:  Fat and other substances building up in the lining of a tube.  Swelling of the walls of a blood vessel.  Certain tissue diseases.  Belly (abdominal) trauma.  An  infection in the main artery of the body.  What increases the risk? There are things that make it more likely for you to have an aneurysm. These include:  Being over the age of 78 years old.  Having high blood pressure (hypertension).  Being a male.  Being white.  Being very overweight (obese).  Having a family history of aneurysm.  Using tobacco products.  What are the signs or symptoms? Symptoms depend on the size of the aneurysm and how fast it grows. There may not be symptoms. If symptoms occur, they can include:  Pain (belly, side, lower back, or groin).  Feeling full after eating a small amount of food.  Feeling sick to your stomach (nauseous), throwing up (vomiting), or both.  Feeling a lump in your belly that feels like it is beating (pulsating).  Feeling like you will pass out (faint).  How is this treated?  Medicine to control blood pressure and pain.  Imaging tests to see if the aneurysm gets bigger.  Surgery. How is this prevented? To lessen your chance of getting this condition:  Stop smoking. Stop chewing tobacco.  Limit or avoid alcohol.  Keep your blood pressure, blood sugar, and cholesterol within normal limits.  Eat less salt.  Eat foods low in saturated fats and cholesterol. These are found in animal and whole dairy products.  Eat more fiber. Fiber is found in whole grains, vegetables, and fruits.  Keep a healthy weight.  Stay active and exercise often.  This information is not intended to replace advice given to you by your health care provider. Make sure you discuss any questions you have with your health care provider. Document Released: 05/09/2012 Document Revised: 06/20/2015 Document Reviewed: 02/12/2012 Elsevier Interactive Patient Education  2017 [employer].

## 2017-09-21 ENCOUNTER — Telehealth: Payer: Self-pay | Admitting: Emergency Medicine

## 2017-09-21 NOTE — Telephone Encounter (Signed)
Patient informed that he needs labs drawn to day for his ct that got scheduled for Thursday. Patient verbally understands and agrees to come have labs drawn.

## 2017-09-21 NOTE — Addendum Note (Signed)
Addended by: Ashok Norris on: 09/21/2017 09:01 AM   Modules accepted: Orders

## 2017-09-22 ENCOUNTER — Telehealth: Payer: Self-pay | Admitting: Cardiology

## 2017-09-22 LAB — BASIC METABOLIC PANEL
BUN / CREAT RATIO: 15 (ref 10–24)
BUN: 17 mg/dL (ref 8–27)
CO2: 26 mmol/L (ref 20–29)
CREATININE: 1.12 mg/dL (ref 0.76–1.27)
Calcium: 9.5 mg/dL (ref 8.6–10.2)
Chloride: 101 mmol/L (ref 96–106)
GFR calc Af Amer: 72 mL/min/{1.73_m2} (ref >59)
GFR calc non Af Amer: 63 mL/min/{1.73_m2} (ref >59)
GLUCOSE: 123 mg/dL — AB (ref 65–99)
Potassium: 4.3 mmol/L (ref 3.5–5.2)
SODIUM: 138 mmol/L (ref 134–144)

## 2017-09-22 NOTE — Telephone Encounter (Signed)
BUN   17 Creatine  1.12 Glucose  123 Calcium 9.5 Sodium 138 Potassium 4.3 [place]  101 C O2  26

## 2017-09-28 ENCOUNTER — Other Ambulatory Visit: Payer: Self-pay

## 2017-09-28 DIAGNOSIS — I712 Thoracic aortic aneurysm, without rupture, unspecified: Secondary | ICD-10-CM

## 2017-11-09 ENCOUNTER — Ambulatory Visit (INDEPENDENT_AMBULATORY_CARE_PROVIDER_SITE_OTHER): Payer: Medicare Other

## 2017-11-09 DIAGNOSIS — I712 Thoracic aortic aneurysm, without rupture: Secondary | ICD-10-CM

## 2017-11-09 DIAGNOSIS — I7121 Aneurysm of the ascending aorta, without rupture: Secondary | ICD-10-CM

## 2017-11-09 NOTE — Progress Notes (Signed)
AAA duplex exam has been performed.   Jimmy Maylynn Orzechowski RDCS, RVT

## 2017-11-24 DIAGNOSIS — M79671 Pain in right foot: Secondary | ICD-10-CM | POA: Insufficient documentation

## 2017-11-24 HISTORY — DX: Pain in right foot: M79.671

## 2017-12-02 DIAGNOSIS — M7751 Other enthesopathy of right foot: Secondary | ICD-10-CM

## 2017-12-02 HISTORY — DX: Other enthesopathy of right foot and ankle: M77.51

## 2017-12-22 ENCOUNTER — Other Ambulatory Visit: Payer: Self-pay | Admitting: Cardiology

## 2018-03-02 ENCOUNTER — Other Ambulatory Visit: Payer: Self-pay | Admitting: Cardiology

## 2018-03-30 ENCOUNTER — Ambulatory Visit: Payer: Medicare Other | Admitting: Cardiology

## 2018-04-21 ENCOUNTER — Telehealth: Payer: Self-pay | Admitting: *Deleted

## 2018-04-21 NOTE — Telephone Encounter (Signed)
   Primary Cardiologist: [facility]  Patient contacted.  History reviewed.  No symptoms to suggest any unstable cardiac conditions.  Based on discussion, with current pandemic situation, we will be postponing this appointment for Jacob Weber with a plan for f/u in 6-12 wks or sooner if feasible/necessary.  If symptoms change, he has been instructed to contact our office.    Austin Miles, RN  04/21/2018 9:01 AM

## 2018-04-22 ENCOUNTER — Ambulatory Visit: Payer: Medicare Other | Admitting: Cardiology

## 2018-04-26 ENCOUNTER — Other Ambulatory Visit: Payer: Self-pay | Admitting: Cardiology

## 2018-05-05 ENCOUNTER — Telehealth: Payer: Self-pay

## 2018-05-05 NOTE — Telephone Encounter (Signed)
YOUR CARDIOLOGY TEAM HAS ARRANGED FOR AN E-VISIT FOR YOUR APPOINTMENT - PLEASE REVIEW IMPORTANT INFORMATION BELOW SEVERAL DAYS PRIOR TO YOUR APPOINTMENT  Due to the recent COVID-19 pandemic, we are transitioning in-person office visits to tele-medicine visits in an effort to decrease unnecessary exposure to our patients and staff. Medicare and most insurances are covering these visits without a copay needed. We also encourage you to sign up for MyChart if you have not already done so. You will need a smartphone if possible. For patients that do not have this, we can still complete the visit using a regular telephone but do prefer a smartphone to enable video when possible. You may have a close family member that lives with you that can help. If possible, we also ask that you have a blood pressure cuff and scale at home to measure your blood pressure, heart rate and weight prior to your scheduled appointment. Patients with clinical needs that need an in-person evaluation and testing will still be able to come to the office if absolutely necessary. If you have any questions, feel free to call our office.    IF YOU HAVE A SMARTPHONE, PLEASE DOWNLOAD THE WEBEX APP TO YOUR SMARTPHONE  - If Apple, go to App Store and type in WebEx in the search bar. Download [employer] Webex Meetings, the blue/green circle. The app is free but as with any other app download, your phone may require you to verify saved payment information or Apple password. You do NOT have to create a WebEx account.  - If Android, go to Google Play Store and type in WebEx in the search bar. Download [employer] Webex Meetings, the blue/green circle. The app is free but as with any other app download, your phone may require you to verify saved payment information or Android password. You do NOT have to create a WebEx account.  It is very helpful to have this downloaded before your visit.    2-3 DAYS BEFORE YOUR APPOINTMENT  You will receive a  telephone call from one of our [facility] team members - your caller ID may say "Unknown caller." If this is a video visit, we will confirm that you have been able to download the WebEx app. We will remind you check your blood pressure, heart rate and weight prior to your scheduled appointment. If you have an Apple Watch or Kardia, please upload any pertinent ECG strips the day before or morning of your appointment to MyChart. Our staff will also make sure you have reviewed the consent and agree to move forward with your scheduled tele-health visit.     THE DAY OF YOUR APPOINTMENT  Approximately 15 minutes prior to your scheduled appointment, you will receive a telephone call from one of HeartCare team - your caller ID may say "Unknown caller."  Our staff will confirm medications, vital signs for the day and any symptoms you may be experiencing. Please have this information available prior to the time of visit start. It may also be helpful for you to have a pad of paper and pen handy for any instructions given during your visit. They will also walk you through joining the WebEx smartphone meeting if this is a video visit.    CONSENT FOR TELE-HEALTH VISIT - PLEASE REVIEW  I hereby voluntarily request, consent and authorize [facility] and its employed or contracted physicians, physician assistants, nurse practitioners or other licensed health care professionals (the Practitioner), to provide me with telemedicine health care services (the "Services") as   deemed necessary by the treating Practitioner. I acknowledge and consent to receive the Services by the Practitioner via telemedicine. I understand that the telemedicine visit will involve communicating with the Practitioner through live audiovisual communication technology and the disclosure of certain medical information by electronic transmission. I acknowledge that I have been given the opportunity to request an in-person assessment or other available  alternative prior to the telemedicine visit and am voluntarily participating in the telemedicine visit.  I understand that I have the right to withhold or withdraw my consent to the use of telemedicine in the course of my care at any time, without affecting my right to future care or treatment, and that the Practitioner or I may terminate the telemedicine visit at any time. I understand that I have the right to inspect all information obtained and/or recorded in the course of the telemedicine visit and may receive copies of available information for a reasonable fee.  I understand that some of the potential risks of receiving the Services via telemedicine include:  Marland Kitchen Delay or interruption in medical evaluation due to technological equipment failure or disruption; . Information transmitted may not be sufficient (e.g. poor resolution of images) to allow for appropriate medical decision making by the Practitioner; and/or  . In rare instances, security protocols could fail, causing a breach of personal health information.  Furthermore, I acknowledge that it is my responsibility to provide information about my medical history, conditions and care that is complete and accurate to the best of my ability. I acknowledge that Practitioner's advice, recommendations, and/or decision may be based on factors not within their control, such as incomplete or inaccurate data provided by me or distortions of diagnostic images or specimens that may result from electronic transmissions. I understand that the practice of medicine is not an exact science and that Practitioner makes no warranties or guarantees regarding treatment outcomes. I acknowledge that I will receive a copy of this consent concurrently upon execution via email to the email address I last provided but may also request a printed copy by calling the office of [facility].    I understand that my insurance will be billed for this visit.   I have read or had  this consent read to me. . I understand the contents of this consent, which adequately explains the benefits and risks of the Services being provided via telemedicine.  . I have been provided ample opportunity to ask questions regarding this consent and the Services and have had my questions answered to my satisfaction. . I give my informed consent for the services to be provided through the use of telemedicine in my medical care  By participating in this telemedicine visit I agree to the above. Patient gave verbal consent to have televisit-Doxy[facility] with Dr Armond Hang on 05-09-18.  Also, sent invite for patient to join MyChart. nhr,cma

## 2018-05-09 ENCOUNTER — Other Ambulatory Visit: Payer: Self-pay

## 2018-05-09 ENCOUNTER — Encounter: Payer: Self-pay | Admitting: Cardiology

## 2018-05-09 ENCOUNTER — Telehealth (INDEPENDENT_AMBULATORY_CARE_PROVIDER_SITE_OTHER): Payer: Medicare Other | Admitting: Cardiology

## 2018-05-09 VITALS — BP 118/72 | HR 79 | Ht 74.5 in | Wt 200.0 lb

## 2018-05-09 DIAGNOSIS — E782 Mixed hyperlipidemia: Secondary | ICD-10-CM

## 2018-05-09 DIAGNOSIS — I25119 Atherosclerotic heart disease of native coronary artery with unspecified angina pectoris: Secondary | ICD-10-CM | POA: Diagnosis not present

## 2018-05-09 DIAGNOSIS — I5042 Chronic combined systolic (congestive) and diastolic (congestive) heart failure: Secondary | ICD-10-CM

## 2018-05-09 DIAGNOSIS — I712 Thoracic aortic aneurysm, without rupture: Secondary | ICD-10-CM

## 2018-05-09 DIAGNOSIS — I7121 Aneurysm of the ascending aorta, without rupture: Secondary | ICD-10-CM

## 2018-05-09 DIAGNOSIS — I110 Hypertensive heart disease with heart failure: Secondary | ICD-10-CM

## 2018-05-09 NOTE — Patient Instructions (Signed)
Your physician recommends that you continue on your current medications as directed. Please refer to the Current Medication list given to you today.  If you need a refill on your cardiac medications before your next appointment, please call your pharmacy.   Lab work: Your physician recommends that you return for lab work 05/10/2018 BMP, proBNP LIPIDS in 71month  If you have labs (blood work) drawn today and your tests are completely normal, you will receive your results only by: Marland Kitchen MyChart Message (if you have MyChart) OR . A paper copy in the mail If you have any lab test that is abnormal or we need to change your treatment, we will call you to review the results.  Testing/Procedures: None  Follow-Up: At [facility], you and your health needs are our priority.  As part of our continuing mission to provide you with exceptional heart care, we have created designated Provider Care Teams.  These Care Teams include your primary Cardiologist (physician) and Advanced Practice Providers (APPs -  Physician Assistants and Nurse Practitioners) who all work together to provide you with the care you need, when you need it. You will need a follow up appointment in 6 months. Any Other Special Instructions Will Be Listed Below (If Applicable).

## 2018-05-09 NOTE — Progress Notes (Signed)
Virtual Visit via Video Note   This visit type was conducted due to national recommendations for restrictions regarding the COVID-19 Pandemic (e.g. social distancing) in an effort to limit this patient's exposure and mitigate transmission in our community.  Due to his co-morbid illnesses, this patient is at least at moderate risk for complications without adequate follow up.  This format is felt to be most appropriate for this patient at this time.  All issues noted in this document were discussed and addressed.  A limited physical exam was performed with this format.  Please refer to the patient's chart for his consent to telehealth for [facility].   Evaluation Performed:  Follow-up visit  Date:  05/09/2018   ID:  Jacob Weber, DOB [birth year], MRN [id number]  Patient Location: Home  Provider Location: Home  PCP:  Algis Greenhouse, MD  Cardiologist:  No primary care provider on file. [facility] Electrophysiologist:  None   Chief Complaint: Follow-up for CAD and heart failure  History of Present Illness:    Jacob Weber is a 78 y.o. male who presents via audio/video conferencing for a telehealth visit today.    Jacob Weber is a 78 y.o. male with a hx of CAD, CABG in 1997,  CHF last EF 58% , Dyslipidemia with statin intolerance , HTN, S/P CABG in 1997 as well as COPD lung nodule and dilated ascending thoracic aorta at 42 mm.  He was last seen  09/20/17.  He is also being followed for pulmonary nodule by [facility] pulmonary in [place], [place].  Mr. Jerelyn Charles has a smart phone but unfortunately has no Internet access and I tried all conceivable forms of HIPAA compliant telecommunication and was unsuccessful except for phone.  He feels well he had bruised his leg he is slowly recovering his daughter had looked at it and he had a hematoma.  He has no history of DVT or pulmonary embolism he is not short of breath no edema chest pain palpitation or syncope.  He is  bothered by seasonal allergies.  He has good healthcare literacy I told him to start wearing a mask he is practicing distancing social isolation sanitizing and washing his hands.  The patient does not have symptoms concerning for COVID-19 infection (fever, chills, cough, or new shortness of breath).    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 MPS 08/11/2010 with EF 56% and mild apical and septal iscjhemia 2. Cath 03/17/2011 with CTO of LAD and RCA, LTA is patent to D, SVG is patent to the RCA, SVG to LAD is chronically occluded, EF 58%   Chronic cough 06/23/2016   Overview:  Onset 2015.  CT chest mild changes.    Last Assessment & Plan:  Here for evaluation of his upper airway from pulmonology due to chronic cough. Recently had his PPI increased to twice a day and was started on an inhaler.  He has noticed improvement in his cough, but it still bothersome.  No ACE inhibitor.  Cough seems to be much worse in the spring. EXAM by indirect laryngoscopy shows normal true vocal  cords move well on phonation.  3+ edema of the arytenoid. PLAN: Reassured his larynx looks okay.  Mild arytenoid edema suggestive of reflux-induced irritation.  I agree with everything that has been done.  Let us see how he does with his new inhaler twice a day PPI therapy for the month of June.  If he still persists with a bothersome cough, he will call to set up allergy testing.  Call sooner if symptoms are worsening.   Coronary artery disease    Disease of thyroid gland 10/15/2016   Emphysema of lung ([place]) 06/11/2016   Overview:  2018: mild  changes on CT, remote cigs   Esophagitis, erosive 03/29/2016   Overview:  (2012) DX/RX: EGD, CHRONIC PPI NO GI RECHECK;   Essential tremor 03/29/2016   GERD (gastroesophageal reflux disease)    Hyperlipidemia 12/04/2014   Hypertension    Hypertensive heart disease with heart failure ([place]) 12/04/2014   Irritable bowel syndrome without diarrhea 02/28/2015   MI (myocardial infarction) ([place]) 10/15/2016   Mixed hyperlipidemia 12/04/2014   Pneumonitis 06/10/2016   Overview:  2018   Preventative health care 04/11/2015   Screening for prostate cancer 04/12/2015   Overview:  2017: DRE declined   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     carvedilol (COREG) 25 MG tablet TAKE 1/2 TABLET BY MOUTH TWICE (2) DAILY   dicyclomine (BENTYL) 10 MG capsule take 1 capsule by mouth four times a day if needed for INTESTINAL SPASMS   furosemide (LASIX) 40 MG tablet TAKE 1 TABLET BY MOUTH ONCE (1) DAILY   levothyroxine (SYNTHROID, LEVOTHROID) 100 MCG tablet Take 100 mcg by mouth daily.     nitroGLYCERIN (NITROSTAT) 0.4 MG SL tablet Place 0.4 mg under the tongue every 5 (five) minutes as needed.    pantoprazole (PROTONIX) 40 MG tablet Take 40 mg by mouth 2 (two) times daily.    potassium chloride SA (K-DUR,KLOR-CON) 20 MEQ tablet TAKE 1 TABLET BY MOUTH ONCE (1) DAILY   TRAVATAN Z 0.004 % SOLN ophthalmic solution Place 1 drop into both eyes at bedtime.      Allergies:   Ace inhibitors and Statins   Social History   Tobacco  Use   Smoking status: Former Smoker    Last attempt to quit: 12/05/1979    Years since quitting: 38.4   Smokeless tobacco: Never Used  Substance Use Topics   Alcohol use: No   Drug use: No     Family Hx: The patient's family history includes Asthma in his sister; CAD in his mother; Stroke in his father; Thyroid cancer in his sister.  ROS:   Please see the history of present illness.     All other systems reviewed and are negative.   Prior CV studies:   The following studies were reviewed today:  CTA of chest 09/23/2017 shows stable ascending aorta maximum diameter 40 mm.  Abdominal Aorta: No evidence of an abdominal  aortic aneurysm was visualized. IVC/Iliac: No evidence of thrombus in IVC and Iliac veins. Electronically signed by Jenne Campus MD on 11/09/2017 at 1:58:17 PM.   Labs/Other Tests and Data Reviewed:    EKG:  No ECG reviewed.  Recent Labs:  03/17/2018 TSH normal 3.12 09/22/2017: BUN 17; Creatinine, Ser 1.12; Potassium 4.3; Sodium 138   Wt Readings from Last 3 Encounters:  05/09/18 200 lb (90.7 kg)  09/20/17 197 lb 3.2 oz (89.4 kg)  12/21/16 200 lb (90.7 kg)     Objective:    Vital Signs:  BP 118/72 (BP Location: Left Arm, Patient Position: Sitting)    Pulse 79    Ht 6' 2.5" (1.892 m)    Wt 200 lb (90.7 kg)    BMI 25.33 kg/m    Alert oriented mood affect normal thought and cognitive function intact male in no acute distress.   ASSESSMENT & PLAN:    1. CAD stable continue medical therapy aspirin beta-blocker he has been intolerant of lipid-lowering therapy we will recheck his lipid profile and when I see him in the office face-to-face I will try to convince him to consider PCSK9 therapy. 2. Heart failure stable chronic compensated continue his current loop diuretic potassium supplement and self-management with daily weights and sodium restriction 3. Hypertensive heart disease stable continue treatment beta-blocker diuretic 4. Poorly controlled  recheck lipids and try to convince him to accept non-statin therapy next visit 5. Stable on serial CT scan duplex abdominal aorta showed no evidence of aneurysm 6. Stable pulmonary nodule followed by pulmonary [facility]  COVID-19 Education: The signs and symptoms of COVID-19 were discussed with the patient and how to seek care for testing (follow up with PCP or arrange E-visit).  The importance of social distancing was discussed today.  Time:   Today, I have spent 26 minutes with the patient with telehealth technology discussing the above problems.     Medication Adjustments/Labs and Tests Ordered: Current medicines are reviewed at length with the patient today.  Concerns regarding medicines are outlined above.  Tests Ordered: No orders of the defined types were placed in this encounter.   Medication Changes: No orders of the defined types were placed in this encounter.   Disposition:  Follow up September 2020  Signed, Shirlee More, MD  05/09/2018 3:50 PM    [place]

## 2018-05-10 LAB — BASIC METABOLIC PANEL
BUN/Creatinine Ratio: 15 (ref 10–24)
BUN: 19 mg/dL (ref 8–27)
CO2: 23 mmol/L (ref 20–29)
Calcium: 9.3 mg/dL (ref 8.6–10.2)
Chloride: 100 mmol/L (ref 96–106)
Creatinine, Ser: 1.25 mg/dL (ref 0.76–1.27)
GFR calc Af Amer: 63 mL/min/{1.73_m2} (ref >59)
GFR calc non Af Amer: 54 mL/min/{1.73_m2} — ABNORMAL LOW (ref >59)
Glucose: 118 mg/dL — ABNORMAL HIGH (ref 65–99)
Potassium: 4 mmol/L (ref 3.5–5.2)
Sodium: 140 mmol/L (ref 134–144)

## 2018-05-10 LAB — PRO B NATRIURETIC PEPTIDE: NT-Pro BNP: 186 pg/mL (ref 0–486)

## 2018-08-10 ENCOUNTER — Other Ambulatory Visit: Payer: Self-pay | Admitting: Cardiology

## 2018-09-27 ENCOUNTER — Other Ambulatory Visit: Payer: Self-pay | Admitting: Cardiology

## 2018-10-26 ENCOUNTER — Telehealth: Payer: Medicare Other | Admitting: Cardiology

## 2018-11-27 NOTE — Progress Notes (Deleted)
Cardiology Office Note:    Date:  11/27/2018   ID:  Jacob MANELLA, DOB 1940/03/22, MRN [id number]  PCP:  Algis Greenhouse, MD  Cardiologist:  Shirlee More, MD    Referring MD: Algis Greenhouse, MD    ASSESSMENT:    No diagnosis found. PLAN:    In order of problems listed above:  1. ***   Next appointment: ***   Medication Adjustments/Labs and Tests Ordered: Current medicines are reviewed at length with the patient today.  Concerns regarding medicines are outlined above.  No orders of the defined types were placed in this encounter.  No orders of the defined types were placed in this encounter.   No chief complaint on file.   History of Present Illness:    Jacob Weber is a 78 y.o. male with a hx of CAD, CABG in 1997,  CHF last EF 58% , Dyslipidemia with statin intolerance , HTN, S/P CABG in 1997 as well as COPD lung nodule and dilated ascending thoracic aorta at 42 mm.   last seen 05/09/2018 with a video virtual visit.  Follow-up labs included a low proBNP level 186 and stable renal function creatinine 1.25 GFR 54 cc potassium 4.0.. Compliance with diet, lifestyle and medications: *** Past Medical History:  Diagnosis Date  . Acquired hypothyroidism 03/13/2015  . Acute bronchitis due to other specified organisms 04/22/2016  . Angina pectoris ([place]) 03/29/2016  . Ascending aortic aneurysm ([place]) 06/11/2016   Overview:  2018: CT 4.2 cm  . 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 Chronic combined systolic and diastolic heart failure ([place]) 12/04/2014  . Chronic coronary artery disease 12/04/2014   Overview:  1. MPS 08/11/2010 with EF 56% and mild apical and septal iscjhemia 2. Cath 03/17/2011 with CTO of LAD and RCA, LTA is patent to D, SVG is patent to the RCA, SVG to LAD is chronically occluded, EF 58%  . Chronic cough 06/23/2016   Overview:  Onset 2015.  CT chest mild changes.    Last Assessment & Plan:  Here for evaluation of his upper airway from pulmonology due to  chronic cough. Recently had his PPI increased to twice a day and was started on an inhaler.  He has noticed improvement in his cough, but it still bothersome.  No ACE inhibitor.  Cough seems to be much worse in the spring. EXAM by indirect laryngoscopy shows normal true vocal cords move well on phonation.  3+ edema of the arytenoid. PLAN: Reassured his larynx looks okay.  Mild arytenoid edema suggestive of reflux-induced irritation.  I agree with everything that has been done.  Let us see how he does with his new inhaler twice a day PPI therapy for the month of June.  If he still persists with a bothersome cough, he will call to set up allergy testing.  Call sooner if symptoms are worsening.  . Coronary artery disease   . Disease of thyroid gland 10/15/2016  . Emphysema of lung ([place]) 06/11/2016   Overview:  2018: mild changes on CT, remote cigs  . Esophagitis, erosive 03/29/2016   Overview:  (2012) DX/RX: EGD, CHRONIC PPI NO GI RECHECK;  . Essential tremor 03/29/2016  . GERD (gastroesophageal reflux disease)   . Hyperlipidemia 12/04/2014  . Hypertension   . Hypertensive heart disease with heart failure ([place]) 12/04/2014  . Irritable bowel syndrome without diarrhea 02/28/2015  . MI (myocardial infarction) ([place]) 10/15/2016  . Mixed hyperlipidemia 12/04/2014  . Pneumonitis 06/10/2016   Overview:  2018  .  Preventative health care 04/11/2015  . Screening for prostate cancer 04/12/2015   Overview:  2017: DRE declined  . 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 Solitary pulmonary nodule 06/11/2016   Overview:  09/10/16: CT 9 mm LLL, stable on f/u - recheck February 2019  . Thyroid disease   . Trigeminal neuralgia 03/29/2016    Past Surgical History:  Procedure Laterality Date  . CHOLECYSTECTOMY    . CORONARY ANGIOPLASTY WITH STENT PLACEMENT    . CORONARY ARTERY BYPASS GRAFT  x 5  . TOTAL KNEE ARTHROPLASTY    . TOTAL SHOULDER REPLACEMENT      Current Medications: No outpatient medications have been  marked as taking for the 11/29/18 encounter (Appointment) with Richardo Priest, MD.     Allergies:   Ace inhibitors and Statins   Social History   Socioeconomic History  . Marital status: Married    Spouse name: Not on file  . Number of children: Not on file  . Years of education: Not on file  . Highest education level: Not on file  Occupational History  . Not on file  Social Needs  . Financial resource strain: Not on file  . Food insecurity    Worry: Not on file    Inability: Not on file  . Transportation needs    Medical: Not on file    Non-medical: Not on file  Tobacco Use  . Smoking status: Former Smoker    Quit date: 12/05/1979    Years since quitting: 39.0  . Smokeless tobacco: Never Used  Substance and Sexual Activity  . Alcohol use: No  . Drug use: No  . Sexual activity: Not on file  Lifestyle  . Physical activity    Days per week: Not on file    Minutes per session: Not on file  . Stress: Not on file  Relationships  . Social Herbalist on phone: Not on file    Gets together: Not on file    Attends religious service: Not on file    Active member of club or organization: Not on file    Attends meetings of clubs or organizations: Not on file    Relationship status: Not on file  Other Topics Concern  . Not on file  Social History Narrative  . Not on file     Family History: The patient's ***family history includes Asthma in his sister; CAD in his mother; Stroke in his father; Thyroid cancer in his sister. ROS:   Please see the history of present illness.    All other systems reviewed and are negative.  EKGs/Labs/Other Studies Reviewed:    The following studies were reviewed today:  EKG:  EKG ordered today and personally reviewed.  The ekg ordered today demonstrates ***  Recent Labs: 05/10/2018: BUN 19; Creatinine, Ser 1.25; NT-Pro BNP 186; Potassium 4.0; Sodium 140  Recent Lipid Panel No results found for: CHOL, TRIG, HDL, CHOLHDL, VLDL,  LDLCALC, LDLDIRECT  Physical Exam:    VS:  There were no vitals taken for this visit.    Wt Readings from Last 3 Encounters:  05/09/18 200 lb (90.7 kg)  09/20/17 197 lb 3.2 oz (89.4 kg)  12/21/16 200 lb (90.7 kg)     GEN: *** Well nourished, well developed in no acute distress HEENT: Normal NECK: No JVD; No carotid bruits LYMPHATICS: No lymphadenopathy CARDIAC: ***RRR, no murmurs, rubs, gallops RESPIRATORY:  Clear to auscultation without rales, wheezing or rhonchi  ABDOMEN: Soft, non-tender, non-distended  MUSCULOSKELETAL:  No edema; No deformity  SKIN: Warm and dry NEUROLOGIC:  Alert and oriented x 3 PSYCHIATRIC:  Normal affect    Signed, Shirlee More, MD  11/27/2018 12:38 PM    [facility]

## 2018-11-29 ENCOUNTER — Telehealth: Payer: Medicare Other | Admitting: Cardiology

## 2019-01-25 NOTE — Progress Notes (Signed)
Cardiology Office Note:    Date:  01/26/2019   ID:  CARDEN BYCE, DOB 03-14-1940, MRN [id number]  PCP:  Algis Greenhouse, MD  Cardiologist:  Shirlee More, MD    Referring MD: Algis Greenhouse, MD    ASSESSMENT:    1. Coronary artery disease involving native coronary artery of native heart with angina pectoris ([place])   2. Hypertensive heart disease with heart failure ([place])   3. Ascending aortic aneurysm ([place])   4. Mixed hyperlipidemia    PLAN:    In order of problems listed above:  1. Stable CAD presently is more than 20 years remote from bypass he has had a good durable response we will continue his medical treatment with aspirin beta-blocker and initiate nonstatin lipid-lowering therapy. 2. Stable BP at target heart failure compensated continue his diuretic, he will not be seen at the [facility] hospital till summer we will check renal function potassium a proBNP level today 3. We will discuss repeat imaging at his next visit 4. Statin intolerant trial of Zetia   Next appointment: Months 6   Medication Adjustments/Labs and Tests Ordered: Current medicines are reviewed at length with the patient today.  Concerns regarding medicines are outlined above.  No orders of the defined types were placed in this encounter.  No orders of the defined types were placed in this encounter.   Chief Complaint  Patient presents with  . Coronary Artery Disease  . Congestive Heart Failure  . Hyperlipidemia    With statin intolerance  . Follow-up    Enlarged thoracic aorta 42 mm a sending August 2019    History of Present Illness:    Jacob Weber is a 78 y.o. male with a hx of CAD, CABG in 1997,  CHF last EF 40 to 45% January 2018 , Dyslipidemia with statin intolerance , HTN, S/P CABG in 1997 as well as COPD lung nodule and dilated ascending thoracic aorta at 42 mm in August 2019 last seen 05/09/2018 virtual visit.  Compliance with diet, lifestyle and medications: Yes  In general he is  doing well he is concerned about his exposures with Covid at church and after discussion he is going to avoid services for the time being until immunized.  Asked him to start wearing medical eye protection.  He has had no edema orthopnea but he short of breath when he does heavy work using a chainsaw to worse no angina palpitation or syncope.  He is statin intolerant and is willing to try Zetia I have ordered 10 mg daily.  If intolerant PCSK9 is an option but he is worried about cost.  He has moderate enlargement his thoracic aorta and because of Covid after discussion we decided to delay any repeat imaging until his next visit and I think that safe  Ref Range & Units 8 mo ago   NT-Pro BNP 0 - 486 pg/mL 186     Past Medical History:  Diagnosis Date  . Acquired hypothyroidism 03/13/2015  . Acute bronchitis due to other specified organisms 04/22/2016  . Angina pectoris ([place]) 03/29/2016  . Ascending aortic aneurysm ([place]) 06/11/2016   Overview:  2018: CT 4.2 cm  . Cardiomyopathy, secondary ([place]) 12/04/2014  . Chronic combined systolic and diastolic heart failure ([place]) 12/04/2014  . Chronic coronary artery disease 12/04/2014   Overview:  1. MPS 08/11/2010 with EF 56% and mild apical and septal iscjhemia 2. Cath 03/17/2011 with CTO of LAD and RCA, LTA is patent to D, SVG  is patent to the RCA, SVG to LAD is chronically occluded, EF 58%  . Chronic cough 06/23/2016   Overview:  Onset 2015.  CT chest mild changes.    Last Assessment & Plan:  Here for evaluation of his upper airway from pulmonology due to chronic cough. Recently had his PPI increased to twice a day and was started on an inhaler.  He has noticed improvement in his cough, but it still bothersome.  No ACE inhibitor.  Cough seems to be much worse in the spring. EXAM by indirect laryngoscopy shows normal true vocal cords move well on phonation.  3+ edema of the arytenoid. PLAN: Reassured his larynx looks okay.  Mild arytenoid edema suggestive of  reflux-induced irritation.  I agree with everything that has been done.  Let us see how he does with his new inhaler twice a day PPI therapy for the month of June.  If he still persists with a bothersome cough, he will call to set up allergy testing.  Call sooner if symptoms are worsening.  . Coronary artery disease   . Disease of thyroid gland 10/15/2016  . Emphysema of lung ([place]) 06/11/2016   Overview:  2018: mild changes on CT, remote cigs  . Esophagitis, erosive 03/29/2016   Overview:  (2012) DX/RX: EGD, CHRONIC PPI NO GI RECHECK;  . Essential tremor 03/29/2016  . GERD (gastroesophageal reflux disease)   . Hyperlipidemia 12/04/2014  . Hypertension   . Hypertensive heart disease with heart failure ([place]) 12/04/2014  . Irritable bowel syndrome without diarrhea 02/28/2015  . MI (myocardial infarction) ([place]) 10/15/2016  . Mixed hyperlipidemia 12/04/2014  . Pneumonitis 06/10/2016   Overview:  2018  . Preventative health care 04/11/2015  . Screening for prostate cancer 04/12/2015   Overview:  2017: DRE declined  . Secondary cardiomyopathy ([place]) 12/04/2014  . Solitary pulmonary nodule 06/11/2016   Overview:  09/10/16: CT 9 mm LLL, stable on f/u - recheck February 2019  . Thyroid disease   . Trigeminal neuralgia 03/29/2016    Past Surgical History:  Procedure Laterality Date  . CHOLECYSTECTOMY    . CORONARY ANGIOPLASTY WITH STENT PLACEMENT    . CORONARY ARTERY BYPASS GRAFT  x 5  . TOTAL KNEE ARTHROPLASTY    . TOTAL SHOULDER REPLACEMENT      Current Medications: Current Meds  Medication Sig  . aspirin EC 81 MG tablet Take 81 mg by mouth daily.    . carvedilol (COREG) 25 MG tablet TAKE 1/2 TABLET BY MOUTH TWICE (2) DAILY  . dicyclomine (BENTYL) 10 MG capsule take 1 capsule by mouth four times a day if needed for INTESTINAL SPASMS  . furosemide (LASIX) 40 MG tablet TAKE 1 TABLET BY MOUTH ONCE (1) DAILY  . levothyroxine (SYNTHROID, LEVOTHROID) 100 MCG tablet Take 100 mcg by mouth daily.    .  methocarbamol (ROBAXIN) 500 MG tablet Take 2 tablets by mouth every 4 (four) hours as needed.  . nitroGLYCERIN (NITROSTAT) 0.4 MG SL tablet Place 0.4 mg under the tongue every 5 (five) minutes as needed.   . pantoprazole (PROTONIX) 40 MG tablet Take 40 mg by mouth 2 (two) times daily.   . potassium chloride SA (K-DUR) 20 MEQ tablet TAKE 1 TABLET BY MOUTH ONCE (1) DAILY  . TRAVATAN Z 0.004 % SOLN ophthalmic solution Place 1 drop into both eyes at bedtime.      Allergies:   Ace inhibitors and Statins   Social History   Socioeconomic History  . Marital status: Married  Spouse name: Not on file  . Number of children: Not on file  . Years of education: Not on file  . Highest education level: Not on file  Occupational History  . Not on file  Tobacco Use  . Smoking status: Former Smoker    Quit date: 12/05/1979    Years since quitting: 39.1  . Smokeless tobacco: Never Used  Substance and Sexual Activity  . Alcohol use: No  . Drug use: No  . Sexual activity: Not on file  Other Topics Concern  . Not on file  Social History Narrative  . Not on file   Social Determinants of Health   Financial Resource Strain:   . Difficulty of Paying Living Expenses: Not on file  Food Insecurity:   . Worried About Charity fundraiser in the Last Year: Not on file  . Ran Out of Food in the Last Year: Not on file  Transportation Needs:   . Lack of Transportation (Medical): Not on file  . Lack of Transportation (Non-Medical): Not on file  Physical Activity:   . Days of Exercise per Week: Not on file  . Minutes of Exercise per Session: Not on file  Stress:   . Feeling of Stress : Not on file  Social Connections:   . Frequency of Communication with Friends and Family: Not on file  . Frequency of Social Gatherings with Friends and Family: Not on file  . Attends Religious Services: Not on file  . Active Member of Clubs or Organizations: Not on file  . Attends Archivist Meetings: Not on  file  . Marital Status: Not on file     Family History: The patient's family history includes Asthma in his sister; CAD in his mother; Stroke in his father; Thyroid cancer in his sister. ROS:   Please see the history of present illness.    All other systems reviewed and are negative.  EKGs/Labs/Other Studies Reviewed:    The following studies were reviewed today:  EKG:  EKG ordered today and personally reviewed.  The ekg ordered today demonstrates sinus rhythm normal EKG  Recent Labs:  07/04/2018 cholesterol 211 HDL 39 LDL 153 triglyceride 75 05/10/2018: BUN 19; Creatinine, Ser 1.25; NT-Pro BNP 186; Potassium 4.0; Sodium 140  Recent Lipid Panel No results found for: CHOL, TRIG, HDL, CHOLHDL, VLDL, LDLCALC, LDLDIRECT  Physical Exam:    VS:  BP 120/70   Pulse 62   Ht 6' 2.5" (1.892 m)   Wt 198 lb 12.8 oz (90.2 kg)   SpO2 98%   BMI 25.18 kg/m     Wt Readings from Last 3 Encounters:  01/26/19 198 lb 12.8 oz (90.2 kg)  05/09/18 200 lb (90.7 kg)  09/20/17 197 lb 3.2 oz (89.4 kg)     GEN:  Well nourished, well developed in no acute distress HEENT: Normal NECK: No JVD; No carotid bruits LYMPHATICS: No lymphadenopathy CARDIAC: RRR, no murmurs, rubs, gallops RESPIRATORY:  Clear to auscultation without rales, wheezing or rhonchi  ABDOMEN: Soft, non-tender, non-distended MUSCULOSKELETAL:  No edema; No deformity  SKIN: Warm and dry NEUROLOGIC:  Alert and oriented x 3 PSYCHIATRIC:  Normal affect    Signed, Shirlee More, MD  01/26/2019 2:44 PM    [place]

## 2019-01-26 ENCOUNTER — Other Ambulatory Visit: Payer: Self-pay

## 2019-01-26 ENCOUNTER — Encounter: Payer: Self-pay | Admitting: Cardiology

## 2019-01-26 ENCOUNTER — Ambulatory Visit (INDEPENDENT_AMBULATORY_CARE_PROVIDER_SITE_OTHER): Payer: Medicare Other | Admitting: Cardiology

## 2019-01-26 VITALS — BP 120/70 | HR 62 | Ht 74.5 in | Wt 198.8 lb

## 2019-01-26 DIAGNOSIS — I712 Thoracic aortic aneurysm, without rupture: Secondary | ICD-10-CM

## 2019-01-26 DIAGNOSIS — I25119 Atherosclerotic heart disease of native coronary artery with unspecified angina pectoris: Secondary | ICD-10-CM

## 2019-01-26 DIAGNOSIS — I110 Hypertensive heart disease with heart failure: Secondary | ICD-10-CM | POA: Diagnosis not present

## 2019-01-26 DIAGNOSIS — E782 Mixed hyperlipidemia: Secondary | ICD-10-CM

## 2019-01-26 DIAGNOSIS — I7121 Aneurysm of the ascending aorta, without rupture: Secondary | ICD-10-CM

## 2019-01-26 MED ORDER — EZETIMIBE 10 MG PO TABS: 10.0000 mg | ORAL_TABLET | Freq: Every day | ORAL | 3 refills | Status: DC

## 2019-01-26 NOTE — Patient Instructions (Addendum)
Medication Instructions:  Your physician has recommended you make the following change in your medication:   START taking zetia 10mg  (1 tablet)once daily  *If you need a refill on your cardiac medications before your next appointment, please call your pharmacy*  Lab Work: NONE If you have labs (blood work) drawn today and your tests are completely normal, you will receive your results only by: Marland Kitchen MyChart Message (if you have MyChart) OR . A paper copy in the mail If you have any lab test that is abnormal or we need to change your treatment, we will call you to review the results.  Testing/Procedures: You had an EKG Performed today  Follow-Up: At [facility], you and your health needs are our priority.  As part of our continuing mission to provide you with exceptional heart care, we have created designated Provider Care Teams.  These Care Teams include your primary Cardiologist (physician) and Advanced Practice Providers (APPs -  Physician Assistants and Nurse Practitioners) who all work together to provide you with the care you need, when you need it.  Your next appointment:   6 month(s)  The format for your next appointment:   In Person  Provider:   Shirlee More, MD  Other Instructions Ezetimibe Tablets What is this medicine? EZETIMIBE (ez ET i mibe) blocks the absorption of cholesterol from the stomach. It can help lower blood cholesterol for patients who are at risk of getting heart disease or a stroke. It is only for patients whose cholesterol level is not controlled by diet. This medicine may be used for other purposes; ask your health care provider or pharmacist if you have questions. COMMON BRAND NAME(S): Zetia What should I tell my health care provider before I take this medicine? They need to know if you have any of these conditions:  liver disease  an unusual or allergic reaction to ezetimibe, medicines, foods, dyes, or preservatives  pregnant or trying to get  pregnant  breast-feeding How should I use this medicine? Take this medicine by mouth with a glass of water. Follow the directions on the prescription label. This medicine can be taken with or without food. Take your doses at regular intervals. Do not take your medicine more often than directed. Talk to your pediatrician regarding the use of this medicine in children. Special care may be needed. Overdosage: If you think you have taken too much of this medicine contact a poison control center or emergency room at once. NOTE: This medicine is only for you. Do not share this medicine with others. What if I miss a dose? If you miss a dose, take it as soon as you can. If it is almost time for your next dose, take only that dose. Do not take double or extra doses. What may interact with this medicine? Do not take this medicine with any of the following medications:  fenofibrate  gemfibrozil This medicine may also interact with the following medications:  antacids  cyclosporine  herbal medicines like red yeast rice  other medicines to lower cholesterol or triglycerides This list may not describe all possible interactions. Give your health care provider a list of all the medicines, herbs, non-prescription drugs, or dietary supplements you use. Also tell them if you smoke, drink alcohol, or use illegal drugs. Some items may interact with your medicine. What should I watch for while using this medicine? Visit your doctor or health care professional for regular checks on your progress. You will need to have your cholesterol  levels checked. If you are also taking some other cholesterol medicines, you will also need to have tests to make sure your liver is working properly. Tell your doctor or health care professional if you get any unexplained muscle pain, tenderness, or weakness, especially if you also have a fever and tiredness. You need to follow a low-cholesterol, low-fat diet while you are  taking this medicine. This will decrease your risk of getting heart and blood vessel disease. Exercising and avoiding alcohol and smoking can also help. Ask your doctor or dietician for advice. What side effects may I notice from receiving this medicine? Side effects that you should report to your doctor or health care professional as soon as possible:  allergic reactions like skin rash, itching or hives, swelling of the face, lips, or tongue  dark yellow or brown urine  unusually weak or tired  yellowing of the skin or eyes Side effects that usually do not require medical attention (report to your doctor or health care professional if they continue or are bothersome):  diarrhea  dizziness  headache  stomach upset or pain This list may not describe all possible side effects. Call your doctor for medical advice about side effects. You may report side effects to FDA at 1-800-FDA-1088. Where should I keep my medicine? Keep out of the reach of children. Store at room temperature between 15 and 30 degrees C (59 and 86 degrees F). Protect from moisture. Keep container tightly closed. Throw away any unused medicine after the expiration date. NOTE: This sheet is a summary. It may not cover all possible information. If you have questions about this medicine, talk to your doctor, pharmacist, or health care provider.  2020 Elsevier/Gold Standard (2011-07-20 15:39:09)    Purchase on line at [employer] or at [employer] on [address]

## 2019-01-27 LAB — BASIC METABOLIC PANEL
BUN/Creatinine Ratio: 14 (ref 10–24)
BUN: 23 mg/dL (ref 8–27)
CO2: 26 mmol/L (ref 20–29)
Calcium: 9.1 mg/dL (ref 8.6–10.2)
Chloride: 101 mmol/L (ref 96–106)
Creatinine, Ser: 1.63 mg/dL — ABNORMAL HIGH (ref 0.76–1.27)
GFR calc Af Amer: 46 mL/min/{1.73_m2} — ABNORMAL LOW (ref >59)
GFR calc non Af Amer: 39 mL/min/{1.73_m2} — ABNORMAL LOW (ref >59)
Glucose: 101 mg/dL — ABNORMAL HIGH (ref 65–99)
Potassium: 4.3 mmol/L (ref 3.5–5.2)
Sodium: 140 mmol/L (ref 134–144)

## 2019-01-27 LAB — PRO B NATRIURETIC PEPTIDE: NT-Pro BNP: 169 pg/mL (ref 0–486)

## 2019-01-31 ENCOUNTER — Telehealth: Payer: Self-pay | Admitting: Emergency Medicine

## 2019-01-31 NOTE — Telephone Encounter (Signed)
Left results on patients voicemail per dpr. Advised patient to call with any questions.

## 2019-02-14 ENCOUNTER — Other Ambulatory Visit: Payer: Self-pay | Admitting: Cardiology

## 2019-03-28 ENCOUNTER — Other Ambulatory Visit: Payer: Self-pay | Admitting: Cardiology

## 2019-03-28 NOTE — Telephone Encounter (Signed)
Rx refill sent to pharmacy. 

## 2019-04-28 ENCOUNTER — Other Ambulatory Visit: Payer: Self-pay | Admitting: Cardiology

## 2019-05-17 ENCOUNTER — Other Ambulatory Visit: Payer: Self-pay | Admitting: Cardiology

## 2019-07-25 NOTE — Progress Notes (Signed)
Cardiology Office Note:    Date:  07/26/2019   ID:  Jacob Weber, DOB 07-19-1940, MRN [id number]  PCP:  Algis Greenhouse, MD  Cardiologist:  Shirlee More, MD    Referring MD: Algis Greenhouse, MD    ASSESSMENT:    1. Chronic combined systolic and diastolic heart failure ([place])   2. Coronary artery disease involving native coronary artery of native heart with angina pectoris ([place])   3. Hypertensive heart disease with heart failure ([place])   4. Mixed hyperlipidemia    PLAN:    In order of problems listed above:  1. I am concerned with his symptoms of exercise intolerance and shortness of breath he has cardiomyopathy recheck echocardiogram and proBNP level.  At this time he is not overloaded with fluid continue his current loop diuretic 2. Stable CAD he is intolerant of statins and Zetia 3. BP at target rate of EF was severely reduced Entresto would be appropriate 4. Poorly controlled intolerant of statins intolerant of Zetia I asked him to use over-the-counter fish oil   Next appointment: 6 months   Medication Adjustments/Labs and Tests Ordered: Current medicines are reviewed at length with the patient today.  Concerns regarding medicines are outlined above.  No orders of the defined types were placed in this encounter.  No orders of the defined types were placed in this encounter.   Chief Complaint  Patient presents with  . Follow-up  . Congestive Heart Failure  . Coronary Artery Disease    History of Present Illness:    Jacob Weber is a 79 y.o. male with a hx of CAD, CABG in 1997,  CHF last EF 40 to 45% January 2018 , Dyslipidemia with statin intolerance , HTN, S/P CABG in 1997 as well as COPD lung nodule and dilated ascending thoracic aorta at 42 mm in August 2019  last seen 01/26/2020. Compliance with diet, lifestyle and medications: Yes  He took Zetia for about 6 weeks and developed marked muscle weakness and stopped he has been intolerant of statins try  over-the-counter fish oil to see if he can tolerate it if not I will put him on prescription.  He is not having angina but he does vigorous activities especially standing he becomes very short of breath weak has to sit in a chair and recover he has no edema orthopnea has had no chest pain palpitation or syncope.  With his previous left ventricular dysfunction we will check labs including proBNP echocardiogram.  We discussed an ischemia evaluation he prefers not to do it at this time. Past Medical History:  Diagnosis Date  . Acquired hypothyroidism 03/13/2015  . Acute bronchitis due to other specified organisms 04/22/2016  . Angina pectoris ([place]) 03/29/2016  . Ascending aortic aneurysm ([place]) 06/11/2016   Overview:  2018: CT 4.2 cm  . Cardiomyopathy, secondary ([place]) 12/04/2014  . Chronic combined systolic and diastolic heart failure ([place]) 12/04/2014  . Chronic coronary artery disease 12/04/2014   Overview:  1. MPS 08/11/2010 with EF 56% and mild apical and septal iscjhemia 2. Cath 03/17/2011 with CTO of LAD and RCA, LTA is patent to D, SVG is patent to the RCA, SVG to LAD is chronically occluded, EF 58%  . Chronic cough 06/23/2016   Overview:  Onset 2015.  CT chest mild changes.    Last Assessment & Plan:  Here for evaluation of his upper airway from pulmonology due to chronic cough. Recently had his PPI increased to twice a day and was  started on an inhaler.  He has noticed improvement in his cough, but it still bothersome.  No ACE inhibitor.  Cough seems to be much worse in the spring. EXAM by indirect laryngoscopy shows normal true vocal cords move well on phonation.  3+ edema of the arytenoid. PLAN: Reassured his larynx looks okay.  Mild arytenoid edema suggestive of reflux-induced irritation.  I agree with everything that has been done.  Let us see how he does with his new inhaler twice a day PPI therapy for the month of June.  If he still persists with a bothersome cough, he will call to set up allergy  testing.  Call sooner if symptoms are worsening.  . Coronary artery disease   . Disease of thyroid gland 10/15/2016  . Emphysema of lung ([place]) 06/11/2016   Overview:  2018: mild changes on CT, remote cigs  . Esophagitis, erosive 03/29/2016   Overview:  (2012) DX/RX: EGD, CHRONIC PPI NO GI RECHECK;  . Essential tremor 03/29/2016  . GERD (gastroesophageal reflux disease)   . Hyperlipidemia 12/04/2014  . Hypertension   . Hypertensive heart disease with heart failure ([place]) 12/04/2014  . Irritable bowel syndrome without diarrhea 02/28/2015  . MI (myocardial infarction) ([place]) 10/15/2016  . Mixed hyperlipidemia 12/04/2014  . Pneumonitis 06/10/2016   Overview:  2018  . Preventative health care 04/11/2015  . Screening for prostate cancer 04/12/2015   Overview:  2017: DRE declined  . Secondary cardiomyopathy ([place]) 12/04/2014  . Solitary pulmonary nodule 06/11/2016   Overview:  09/10/16: CT 9 mm LLL, stable on f/u - recheck February 2019  . Thyroid disease   . Trigeminal neuralgia 03/29/2016    Past Surgical History:  Procedure Laterality Date  . CHOLECYSTECTOMY    . CORONARY ANGIOPLASTY WITH STENT PLACEMENT    . CORONARY ARTERY BYPASS GRAFT  x 5  . TOTAL KNEE ARTHROPLASTY    . TOTAL SHOULDER REPLACEMENT      Current Medications: Current Meds  Medication Sig  . aspirin EC 81 MG tablet Take 81 mg by mouth daily.    . carvedilol (COREG) 25 MG tablet TAKE 1/2 TABLET BY MOUTH TWICE (2) DAILY  . dicyclomine (BENTYL) 10 MG capsule take 1 capsule by mouth four times a day if needed for INTESTINAL SPASMS  . furosemide (LASIX) 40 MG tablet TAKE 1 TABLET BY MOUTH ONCE (1) DAILY  . levothyroxine (SYNTHROID, LEVOTHROID) 100 MCG tablet Take 100 mcg by mouth daily.    . methocarbamol (ROBAXIN) 500 MG tablet Take 2 tablets by mouth every 4 (four) hours as needed.  . nitroGLYCERIN (NITROSTAT) 0.4 MG SL tablet Place 0.4 mg under the tongue every 5 (five) minutes as needed.   . pantoprazole (PROTONIX) 40 MG tablet Take  40 mg by mouth 2 (two) times daily.   . potassium chloride SA (KLOR-CON) 20 MEQ tablet TAKE 1 TABLET BY MOUTH ONCE (1) DAILY  . TRAVATAN Z 0.004 % SOLN ophthalmic solution Place 1 drop into both eyes at bedtime.      Allergies:   Ace inhibitors and Statins   Social History   Socioeconomic History  . Marital status: Married    Spouse name: Not on file  . Number of children: Not on file  . Years of education: Not on file  . Highest education level: Not on file  Occupational History  . Not on file  Tobacco Use  . Smoking status: Former Smoker    Quit date: 12/05/1979    Years since quitting: 39.6  .  Smokeless tobacco: Never Used  Vaping Use  . Vaping Use: Never used  Substance and Sexual Activity  . Alcohol use: No  . Drug use: No  . Sexual activity: Not on file  Other Topics Concern  . Not on file  Social History Narrative  . Not on file   Social Determinants of Health   Financial Resource Strain:   . Difficulty of Paying Living Expenses:   Food Insecurity:   . Worried About Charity fundraiser in the Last Year:   . Arboriculturist in the Last Year:   Transportation Needs:   . Film/video editor (Medical):   Marland Kitchen Lack of Transportation (Non-Medical):   Physical Activity:   . Days of Exercise per Week:   . Minutes of Exercise per Session:   Stress:   . Feeling of Stress :   Social Connections:   . Frequency of Communication with Friends and Family:   . Frequency of Social Gatherings with Friends and Family:   . Attends Religious Services:   . Active Member of Clubs or Organizations:   . Attends Archivist Meetings:   Marland Kitchen Marital Status:      Family History: The patient's family history includes Asthma in his sister; CAD in his mother; Stroke in his father; Thyroid cancer in his sister. ROS:   Please see the history of present illness.    All other systems reviewed and are negative.  EKGs/Labs/Other Studies Reviewed:    The following studies were  reviewed today:  EKG: Was performed 01/26/2019 showed sinus rhythm first-degree AV block otherwise normal  Recent Labs: 01/26/2019: BUN 23; Creatinine, Ser 1.63; NT-Pro BNP 169; Potassium 4.3; Sodium 140  Recent Lipid Panel No results found for: CHOL, TRIG, HDL, CHOLHDL, VLDL, LDLCALC, LDLDIRECT  Physical Exam:    VS:  BP 124/70 (BP Location: Left Arm, Patient Position: Sitting, Cuff Size: Normal)   Pulse 78   Ht 6' 2.5" (1.892 m)   Wt 199 lb (90.3 kg)   SpO2 97%   BMI 25.21 kg/m     Wt Readings from Last 3 Encounters:  07/26/19 199 lb (90.3 kg)  01/26/19 198 lb 12.8 oz (90.2 kg)  05/09/18 200 lb (90.7 kg)     GEN: He does not look acutely ill well nourished, well developed in no acute distress HEENT: Normal NECK: No JVD; No carotid bruits LYMPHATICS: No lymphadenopathy CARDIAC: RRR, no murmurs, rubs, gallops RESPIRATORY:  Clear to auscultation without rales, wheezing or rhonchi  ABDOMEN: Soft, non-tender, non-distended MUSCULOSKELETAL:  No edema; No deformity  SKIN: Warm and dry he has no pallor NEUROLOGIC:  Alert and oriented x 3 PSYCHIATRIC:  Normal affect    Signed, Shirlee More, MD  07/26/2019 1:32 PM    [facility]

## 2019-07-26 ENCOUNTER — Ambulatory Visit (INDEPENDENT_AMBULATORY_CARE_PROVIDER_SITE_OTHER): Payer: Medicare Other | Admitting: Cardiology

## 2019-07-26 ENCOUNTER — Encounter: Payer: Self-pay | Admitting: Cardiology

## 2019-07-26 ENCOUNTER — Other Ambulatory Visit: Payer: Self-pay

## 2019-07-26 VITALS — BP 124/70 | HR 78 | Ht 74.5 in | Wt 199.0 lb

## 2019-07-26 DIAGNOSIS — I5042 Chronic combined systolic (congestive) and diastolic (congestive) heart failure: Secondary | ICD-10-CM | POA: Diagnosis not present

## 2019-07-26 DIAGNOSIS — I110 Hypertensive heart disease with heart failure: Secondary | ICD-10-CM | POA: Diagnosis not present

## 2019-07-26 DIAGNOSIS — E782 Mixed hyperlipidemia: Secondary | ICD-10-CM | POA: Diagnosis not present

## 2019-07-26 DIAGNOSIS — I25119 Atherosclerotic heart disease of native coronary artery with unspecified angina pectoris: Secondary | ICD-10-CM

## 2019-07-26 NOTE — Patient Instructions (Signed)
Medication Instructions:  Your physician recommends that you continue on your current medications as directed. Please refer to the Current Medication list given to you today.   Please purchase an over the counter fish oil supplement and begin taking it daily.  *If you need a refill on your cardiac medications before your next appointment, please call your pharmacy*   Lab Work: Your physician recommends that you return for lab work in: TODAY BMP, ProBNP, CBC If you have labs (blood work) drawn today and your tests are completely normal, you will receive your results only by: Marland Kitchen MyChart Message (if you have MyChart) OR . A paper copy in the mail If you have any lab test that is abnormal or we need to change your treatment, we will call you to review the results.   Testing/Procedures: Your physician has requested that you have an echocardiogram. Echocardiography is a painless test that uses sound waves to create images of your heart. It provides your doctor with information about the size and shape of your heart and how well your heart's chambers and valves are working. This procedure takes approximately one hour. There are no restrictions for this procedure.     Follow-Up: At [facility], you and your health needs are our priority.  As part of our continuing mission to provide you with exceptional heart care, we have created designated Provider Care Teams.  These Care Teams include your primary Cardiologist (physician) and Advanced Practice Providers (APPs -  Physician Assistants and Nurse Practitioners) who all work together to provide you with the care you need, when you need it.  We recommend signing up for the patient portal called "MyChart".  Sign up information is provided on this After Visit Summary.  MyChart is used to connect with patients for Virtual Visits (Telemedicine).  Patients are able to view lab/test results, encounter notes, upcoming appointments, etc.  Non-urgent messages  can be sent to your provider as well.   To learn more about what you can do with MyChart, go to [email or web address].    Your next appointment:   6 month(s)  The format for your next appointment:   In Person  Provider:   Shirlee More, MD   Other Instructions

## 2019-07-27 ENCOUNTER — Telehealth: Payer: Self-pay

## 2019-07-27 LAB — CBC
Hematocrit: 40.7 % (ref 37.5–51.0)
Hemoglobin: 14.3 g/dL (ref 13.0–17.7)
MCH: 32.8 pg (ref 26.6–33.0)
MCHC: 35.1 g/dL (ref 31.5–35.7)
MCV: 93 fL (ref 79–97)
Platelets: 201 10*3/uL (ref 150–450)
RBC: 4.36 x10E6/uL (ref 4.14–5.80)
RDW: 13.5 % (ref 11.6–15.4)
WBC: 6.3 10*3/uL (ref 3.4–10.8)

## 2019-07-27 LAB — BASIC METABOLIC PANEL
BUN/Creatinine Ratio: 13 (ref 10–24)
BUN: 20 mg/dL (ref 8–27)
CO2: 23 mmol/L (ref 20–29)
Calcium: 8.8 mg/dL (ref 8.6–10.2)
Chloride: 102 mmol/L (ref 96–106)
Creatinine, Ser: 1.54 mg/dL — ABNORMAL HIGH (ref 0.76–1.27)
GFR calc Af Amer: 49 mL/min/{1.73_m2} — ABNORMAL LOW (ref >59)
GFR calc non Af Amer: 42 mL/min/{1.73_m2} — ABNORMAL LOW (ref >59)
Glucose: 88 mg/dL (ref 65–99)
Potassium: 4.3 mmol/L (ref 3.5–5.2)
Sodium: 141 mmol/L (ref 134–144)

## 2019-07-27 LAB — PRO B NATRIURETIC PEPTIDE: NT-Pro BNP: 255 pg/mL (ref 0–486)

## 2019-07-27 NOTE — Telephone Encounter (Signed)
Follow up ° ° °Patient is returning your call. Please call. ° ° ° °

## 2019-07-27 NOTE — Telephone Encounter (Signed)
Spoke with patient regarding results and recommendation.  Patient verbalizes understanding and is agreeable to plan of care. Advised patient to call back with any issues or concerns.  

## 2019-07-27 NOTE — Telephone Encounter (Signed)
Left message on patients voicemail to please return our call.   

## 2019-08-11 ENCOUNTER — Ambulatory Visit (INDEPENDENT_AMBULATORY_CARE_PROVIDER_SITE_OTHER): Payer: Medicare Other

## 2019-08-11 ENCOUNTER — Other Ambulatory Visit: Payer: Self-pay

## 2019-08-11 DIAGNOSIS — I5042 Chronic combined systolic (congestive) and diastolic (congestive) heart failure: Secondary | ICD-10-CM

## 2019-08-11 LAB — ECHOCARDIOGRAM COMPLETE
Area-P 1/2: 2.2 cm2
S' Lateral: 4.8 cm
Single Plane A4C EF: 49.7 %

## 2019-08-11 MED ORDER — PERFLUTREN LIPID MICROSPHERE: 1.0000 mL | INTRAVENOUS | 0 refills | Status: DC | PRN

## 2019-08-11 NOTE — Progress Notes (Addendum)
Complete echocardiogram with contrast has been performed.   Jimmy Makinley Muscato RDCS, RVT 

## 2019-08-21 ENCOUNTER — Other Ambulatory Visit: Payer: Self-pay | Admitting: Cardiology

## 2019-08-21 NOTE — Telephone Encounter (Signed)
Rx refill sent to pharmacy. 

## 2019-10-26 ENCOUNTER — Other Ambulatory Visit: Payer: Self-pay | Admitting: Cardiology

## 2019-11-02 DIAGNOSIS — M10072 Idiopathic gout, left ankle and foot: Secondary | ICD-10-CM | POA: Diagnosis present

## 2019-11-02 HISTORY — DX: Idiopathic gout, left ankle and foot: M10.072

## 2020-01-05 DIAGNOSIS — I2510 Atherosclerotic heart disease of native coronary artery without angina pectoris: Secondary | ICD-10-CM | POA: Insufficient documentation

## 2020-01-05 DIAGNOSIS — I10 Essential (primary) hypertension: Secondary | ICD-10-CM | POA: Insufficient documentation

## 2020-02-06 ENCOUNTER — Ambulatory Visit: Payer: Medicare Other | Admitting: Cardiology

## 2020-02-24 ENCOUNTER — Other Ambulatory Visit: Payer: Self-pay | Admitting: Cardiology

## 2020-03-12 NOTE — Progress Notes (Unsigned)
Cardiology Office Note:    Date:  03/13/2020   ID:  BOWYN Jacob Weber, DOB [birth year], MRN [id number]  PCP:  Algis Greenhouse, MD  Cardiologist:  Shirlee More, MD    Referring MD: Algis Greenhouse, MD    ASSESSMENT:    1. Coronary artery disease involving native coronary artery of native heart with angina pectoris ([place])   2. Hypertensive heart disease with heart failure ([place])   3. Mixed hyperlipidemia   4. Ascending aortic aneurysm (HCC)    PLAN:    In order of problems listed above:  1. Stable CAD continue aspirin beta-blocker and is not on lipid-lowering therapy and declines a PCSK9 inhibitor.  He is having no angina at this time I would not repeat an ischemia evaluation 2. BP at target continue his loop diuretic he has no fluid overload potassium carvedilol and check renal function proBNP level echocardiogram shows preserved ejection fraction 3. Poorly controlled he is on no lipid-lowering therapy and declines a PCSK9 inhibitor or Questran. 4. Plan to recheck echocardiogram or CT around the time of his next visit   Next appointment: 6 months   Medication Adjustments/Labs and Tests Ordered: Current medicines are reviewed at length with the patient today.  Concerns regarding medicines are outlined above.  No orders of the defined types were placed in this encounter.  No orders of the defined types were placed in this encounter.   Chief Complaint  Patient presents with  . Follow-up  . Congestive Heart Failure    History of Present Illness:    Jacob Weber is a 80 y.o. male with a hx of CAD with CABG 1997 heart failure ejection fraction 40 to 45% January 2018 dyslipidemia statin intolerance hypertensive heart disease COPD lung nodule and dilated ascending aorta 42 mm last seen 07/26/2019. Compliance with diet, lifestyle and medications: Yes  Overall doing well continues to have exertional shortness of breath but remains very active not severe or limiting no edema  orthopnea chest pain palpitation or syncope. I advised PCSK9 therapy as he statin and Zetia intolerant he declines I advise vasodilator therapy with ARB for his ongoing symptoms of heart failure he declined Overall he is happy with the quality of his life.  Echocardiogram 08/11/2019 showed stable LV function EF 50 to 82% normal diastolic filling pressures normal right ventricular function ascending aorta 43 mm and no significant valvular abnormality. proBNP level was low and stable. Component Ref Range & Units 7 mo ago  (07/26/19) 1 yr ago  (01/26/19) 1 yr ago  (05/10/18)  NT-Pro BNP 0 - 486 pg/mL 255  169 CM  186 CM     Past Medical History:  Diagnosis Date  . Acquired hypothyroidism 03/13/2015  . Acute bronchitis due to other specified organisms 04/22/2016  . Angina pectoris ([place]) 03/29/2016  . Ascending aortic aneurysm ([place]) 06/11/2016   Overview:  2018: CT 4.2 cm  . Cardiomyopathy, secondary ([place]) 12/04/2014  . Chronic combined systolic and diastolic heart failure ([place]) 12/04/2014  . Chronic coronary artery disease 12/04/2014   Overview:  1. MPS 08/11/2010 with EF 56% and mild apical and septal iscjhemia 2. Cath 03/17/2011 with CTO of LAD and RCA, LTA is patent to D, SVG is patent to the RCA, SVG to LAD is chronically occluded, EF 58%  . Chronic cough 06/23/2016   Overview:  Onset 2015.  CT chest mild changes.    Last Assessment & Plan:  Here for evaluation of his upper airway from pulmonology due  to chronic cough. Recently had his PPI increased to twice a day and was started on an inhaler.  He has noticed improvement in his cough, but it still bothersome.  No ACE inhibitor.  Cough seems to be much worse in the spring. EXAM by indirect laryngoscopy shows normal true vocal cords move well on phonation.  3+ edema of the arytenoid. PLAN: Reassured his larynx looks okay.  Mild arytenoid edema suggestive of reflux-induced irritation.  I agree with everything that has been done.  Let us see how he does  with his new inhaler twice a day PPI therapy for the month of June.  If he still persists with a bothersome cough, he will call to set up allergy testing.  Call sooner if symptoms are worsening.  . Coronary artery disease   . Disease of thyroid gland 10/15/2016  . Emphysema of lung ([place]) 06/11/2016   Overview:  2018: mild changes on CT, remote cigs  . Esophagitis, erosive 03/29/2016   Overview:  (2012) DX/RX: EGD, CHRONIC PPI NO GI RECHECK;  . Essential tremor 03/29/2016  . GERD (gastroesophageal reflux disease)   . Hyperlipidemia 12/04/2014  . Hypertension   . Hypertensive heart disease with heart failure ([place]) 12/04/2014  . Irritable bowel syndrome without diarrhea 02/28/2015  . MI (myocardial infarction) ([place]) 10/15/2016  . Mixed hyperlipidemia 12/04/2014  . Pneumonitis 06/10/2016   Overview:  2018  . Preventative health care 04/11/2015  . Screening for prostate cancer 04/12/2015   Overview:  2017: DRE declined  . Secondary cardiomyopathy ([place]) 12/04/2014  . Solitary pulmonary nodule 06/11/2016   Overview:  09/10/16: CT 9 mm LLL, stable on f/u - recheck February 2019  . Thyroid disease   . Trigeminal neuralgia 03/29/2016    Past Surgical History:  Procedure Laterality Date  . CHOLECYSTECTOMY    . CORONARY ANGIOPLASTY WITH STENT PLACEMENT    . CORONARY ARTERY BYPASS GRAFT  x 5  . TOTAL KNEE ARTHROPLASTY    . TOTAL SHOULDER REPLACEMENT      Current Medications: Current Meds  Medication Sig  . allopurinol (ZYLOPRIM) 100 MG tablet Take 100 mg by mouth 2 (two) times daily.  Marland Kitchen aspirin EC 81 MG tablet Take 81 mg by mouth daily.  . carvedilol (COREG) 25 MG tablet TAKE 1/2 TABLET BY MOUTH TWICE DAILY  . dicyclomine (BENTYL) 10 MG capsule take 1 capsule by mouth four times a day if needed for INTESTINAL SPASMS  . furosemide (LASIX) 40 MG tablet TAKE 1 TABLET BY MOUTH ONCE (1) DAILY  . levothyroxine (SYNTHROID, LEVOTHROID) 100 MCG tablet Take 100 mcg by mouth daily.  . nitroGLYCERIN (NITROSTAT) 0.4  MG SL tablet Place 0.4 mg under the tongue every 5 (five) minutes as needed.   . pantoprazole (PROTONIX) 40 MG tablet Take 40 mg by mouth 2 (two) times daily.  . potassium chloride SA (KLOR-CON) 20 MEQ tablet TAKE 1 TABLET BY MOUTH ONCE (1) DAILY  . TRAVATAN Z 0.004 % SOLN ophthalmic solution Place 1 drop into both eyes at bedtime.      Allergies:   Ace inhibitors and Statins   Social History   Socioeconomic History  . Marital status: Married    Spouse name: Not on file  . Number of children: Not on file  . Years of education: Not on file  . Highest education level: Not on file  Occupational History  . Not on file  Tobacco Use  . Smoking status: Former Smoker    Quit date: 12/05/1979  Years since quitting: 40.2  . Smokeless tobacco: Never Used  Vaping Use  . Vaping Use: Never used  Substance and Sexual Activity  . Alcohol use: No  . Drug use: No  . Sexual activity: Not on file  Other Topics Concern  . Not on file  Social History Narrative  . Not on file   Social Determinants of Health   Financial Resource Strain: Not on file  Food Insecurity: Not on file  Transportation Needs: Not on file  Physical Activity: Not on file  Stress: Not on file  Social Connections: Not on file     Family History: The patient's family history includes Asthma in his sister; CAD in his mother; Stroke in his father; Thyroid cancer in his sister. ROS:   Please see the history of present illness.    All other systems reviewed and are negative.  EKGs/Labs/Other Studies Reviewed:    The following studies were reviewed today:  G today shows sinus rhythm first-degree AV block otherwise normal  Recent Labs: 07/26/2019: BUN 20; Creatinine, Ser 1.54; Hemoglobin 14.3; NT-Pro BNP 255; Platelets 201; Potassium 4.3; Sodium 141  Recent Lipid Panel No results found for: CHOL, TRIG, HDL, CHOLHDL, VLDL, LDLCALC, LDLDIRECT  Physical Exam:    VS:  BP 114/70   Pulse 77   Ht 6' 2.5" (1.892 m)    Wt 201 lb (91.2 kg)   SpO2 99%   BMI 25.46 kg/m     Wt Readings from Last 3 Encounters:  03/13/20 201 lb (91.2 kg)  07/26/19 199 lb (90.3 kg)  01/26/19 198 lb 12.8 oz (90.2 kg)     GEN:  Well nourished, well developed in no acute distress HEENT: Normal NECK: No JVD; No carotid bruits LYMPHATICS: No lymphadenopathy CARDIAC: RRR, no murmurs, rubs, gallops RESPIRATORY:  Clear to auscultation without rales, wheezing or rhonchi  ABDOMEN: Soft, non-tender, non-distended MUSCULOSKELETAL:  No edema; No deformity  SKIN: Warm and dry NEUROLOGIC:  Alert and oriented x 3 PSYCHIATRIC:  Normal affect    Signed, Shirlee More, MD  03/13/2020 1:49 PM    [facility]

## 2020-03-13 ENCOUNTER — Encounter: Payer: Self-pay | Admitting: Cardiology

## 2020-03-13 ENCOUNTER — Other Ambulatory Visit: Payer: Self-pay

## 2020-03-13 ENCOUNTER — Ambulatory Visit (INDEPENDENT_AMBULATORY_CARE_PROVIDER_SITE_OTHER): Payer: Medicare Other | Admitting: Cardiology

## 2020-03-13 VITALS — BP 114/70 | HR 77 | Ht 74.5 in | Wt 201.0 lb

## 2020-03-13 DIAGNOSIS — E782 Mixed hyperlipidemia: Secondary | ICD-10-CM

## 2020-03-13 DIAGNOSIS — I712 Thoracic aortic aneurysm, without rupture: Secondary | ICD-10-CM | POA: Diagnosis not present

## 2020-03-13 DIAGNOSIS — I110 Hypertensive heart disease with heart failure: Secondary | ICD-10-CM

## 2020-03-13 DIAGNOSIS — I25119 Atherosclerotic heart disease of native coronary artery with unspecified angina pectoris: Secondary | ICD-10-CM

## 2020-03-13 DIAGNOSIS — I7121 Aneurysm of the ascending aorta, without rupture: Secondary | ICD-10-CM

## 2020-03-13 NOTE — Patient Instructions (Signed)
Medication Instructions:  Your physician recommends that you continue on your current medications as directed. Please refer to the Current Medication list given to you today.  *If you need a refill on your cardiac medications before your next appointment, please call your pharmacy*   Lab Work: Your physician recommends that you return for lab work in: TODAY BMP, ProBNP If you have labs (blood work) drawn today and your tests are completely normal, you will receive your results only by: . MyChart Message (if you have MyChart) OR . A paper copy in the mail If you have any lab test that is abnormal or we need to change your treatment, we will call you to review the results.   Testing/Procedures: None   Follow-Up: At [facility], you and your health needs are our priority.  As part of our continuing mission to provide you with exceptional heart care, we have created designated Provider Care Teams.  These Care Teams include your primary Cardiologist (physician) and Advanced Practice Providers (APPs -  Physician Assistants and Nurse Practitioners) who all work together to provide you with the care you need, when you need it.  We recommend signing up for the patient portal called "MyChart".  Sign up information is provided on this After Visit Summary.  MyChart is used to connect with patients for Virtual Visits (Telemedicine).  Patients are able to view lab/test results, encounter notes, upcoming appointments, etc.  Non-urgent messages can be sent to your provider as well.   To learn more about what you can do with MyChart, go to [email or web address].    Your next appointment:   6 month(s)  The format for your next appointment:   In Person  Provider:   Brian Munley, MD   Other Instructions   

## 2020-03-14 LAB — BASIC METABOLIC PANEL
BUN/Creatinine Ratio: 15 (ref 10–24)
BUN: 15 mg/dL (ref 8–27)
CO2: 22 mmol/L (ref 20–29)
Calcium: 9.3 mg/dL (ref 8.6–10.2)
Chloride: 99 mmol/L (ref 96–106)
Creatinine, Ser: 1.01 mg/dL (ref 0.76–1.27)
GFR calc Af Amer: 80 mL/min/{1.73_m2} (ref >59)
GFR calc non Af Amer: 69 mL/min/{1.73_m2} (ref >59)
Glucose: 98 mg/dL (ref 65–99)
Potassium: 4.1 mmol/L (ref 3.5–5.2)
Sodium: 138 mmol/L (ref 134–144)

## 2020-03-14 LAB — PRO B NATRIURETIC PEPTIDE: NT-Pro BNP: 188 pg/mL (ref 0–486)

## 2020-04-23 ENCOUNTER — Other Ambulatory Visit: Payer: Self-pay | Admitting: Cardiology

## 2020-04-23 NOTE — Telephone Encounter (Signed)
Refill sent to pharmacy.   

## 2020-05-28 ENCOUNTER — Other Ambulatory Visit: Payer: Self-pay | Admitting: Cardiology

## 2020-06-06 DIAGNOSIS — M722 Plantar fascial fibromatosis: Secondary | ICD-10-CM | POA: Insufficient documentation

## 2020-06-06 HISTORY — DX: Plantar fascial fibromatosis: M72.2

## 2020-06-14 DIAGNOSIS — L989 Disorder of the skin and subcutaneous tissue, unspecified: Secondary | ICD-10-CM | POA: Insufficient documentation

## 2020-06-14 HISTORY — DX: Disorder of the skin and subcutaneous tissue, unspecified: L98.9

## 2020-08-07 DIAGNOSIS — L89891 Pressure ulcer of other site, stage 1: Secondary | ICD-10-CM | POA: Insufficient documentation

## 2020-08-07 HISTORY — DX: Pressure ulcer of other site, stage 1: L89.891

## 2020-09-12 ENCOUNTER — Other Ambulatory Visit: Payer: Self-pay | Admitting: Cardiology

## 2020-09-15 NOTE — Progress Notes (Signed)
Cardiology Office Note:    Date:  09/16/2020   ID:  EMILLIANO DERDERIAN, DOB Jun 30, 1940, MRN [id number]  PCP:  Algis Greenhouse, MD  Cardiologist:  Shirlee More, MD    Referring MD: Algis Greenhouse, MD    ASSESSMENT:    1. Coronary artery disease involving native coronary artery of native heart with angina pectoris ([place])   2. Hypertensive heart disease with heart failure ([place])   3. Mixed hyperlipidemia   4. Ascending aortic aneurysm (HCC)    PLAN:    In order of problems listed above:  He remained stable with CAD having no anginal discomfort on current treatment including aspirin carvedilol and again declines lipid-lowering treatment intolerant to statin and Zetia. BP at target continue current treatment carvedilol loop diuretic he has no fluid overload Poorly controlled declines PCSK9 inhibitor Stable on most recent CT.   Next appointment: 6 months   Medication Adjustments/Labs and Tests Ordered: Current medicines are reviewed at length with the patient today.  Concerns regarding medicines are outlined above.  No orders of the defined types were placed in this encounter.  No orders of the defined types were placed in this encounter.   Chief Complaint  Patient presents with   Follow-up   Coronary Artery Disease   Congestive Heart Failure     History of Present Illness:    ANDRELL SUTLEY is a 80 y.o. male with a hx of CAD with remote bypass surgery in 1997 heart failure ejection fraction 50 to 55% July 2021 dyslipidemia statin intolerance hypertensive heart disease with heart failure COPD lung nodule and dilated ascending aorta moderate stable at 43 mm last seen 03/13/2020.  I advised PCSK9 therapy he declined he is statin and Zetia intolerant.  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 Is compliant with his diuretic and  has no edema No chest pain palpitation or syncope. Recent labs from the [facility] hospital cholesterol 201 LDL 130 triglycerides 126 HDL 46 hemoglobin 15.2 creatinine 1.40 potassium 4.1  He was seen [facility] for COVID-19 infection in the ED 08/12/2020. Laboratory studies showed a hemoglobin of 15 white count 8100 sodium 136 potassium 4.1 creatinine 1.35 his COVID test antigen was positive chest x-ray showed no acute findings.  He was felt to be stable in the emergency room and was discharged from the hospital on oral Paxlovid  therapy for his COVID-19 infection. Past Medical History:  Diagnosis Date   Acquired hypothyroidism 03/13/2015   Acute bronchitis due to other specified organisms 04/22/2016   Angina pectoris ([place]) 03/29/2016   Ascending aortic aneurysm ([place]) 06/11/2016   Overview:  2018: CT 4.2 cm   Cardiomyopathy, secondary ([place]) 12/04/2014   Chronic combined systolic and diastolic heart failure ([place]) 12/04/2014   Chronic coronary artery disease 12/04/2014   Overview:  1. MPS 08/11/2010 with EF 56% and mild apical and septal iscjhemia 2. Cath 03/17/2011 with CTO of LAD and RCA, LTA is patent to D, SVG is patent to the RCA, SVG to LAD is chronically occluded, EF 58%   Chronic cough 06/23/2016   Overview:  Onset 2015.  CT chest mild changes.    Last Assessment & Plan:  Here for evaluation of his upper airway from pulmonology due to chronic cough. Recently had his PPI increased to twice a day and was  started on an inhaler.  He has noticed improvement in his cough, but it still bothersome.  No ACE inhibitor.  Cough seems to be much worse in the spring. EXAM by indirect laryngoscopy shows normal true vocal cords move well on phonation.  3+ edema of the arytenoid. PLAN: Reassured his larynx looks okay.  Mild arytenoid edema suggestive of reflux-induced irritation.  I agree with everything that has been done.  Let us see how he does with his new inhaler twice a day PPI therapy for the  month of June.  If he still persists with a bothersome cough, he will call to set up allergy testing.  Call sooner if symptoms are worsening.   Coronary artery disease    Disease of thyroid gland 10/15/2016   Emphysema of lung ([place]) 06/11/2016   Overview:  2018: mild changes on CT, remote cigs   Esophagitis, erosive 03/29/2016   Overview:  (2012) DX/RX: EGD, CHRONIC PPI NO GI RECHECK;   Essential tremor 03/29/2016   GERD (gastroesophageal reflux disease)    Hyperlipidemia 12/04/2014   Hypertension    Hypertensive heart disease with heart failure ([place]) 12/04/2014   Irritable bowel syndrome without diarrhea 02/28/2015   MI (myocardial infarction) ([place]) 10/15/2016   Mixed hyperlipidemia 12/04/2014   Pneumonitis 06/10/2016   Overview:  2018   Preventative health care 04/11/2015   Screening for prostate cancer 04/12/2015   Overview:  2017: DRE declined   Secondary cardiomyopathy ([place]) 12/04/2014   Solitary pulmonary nodule 06/11/2016   Overview:  09/10/16: CT 9 mm LLL, stable on f/u - recheck February 2019   Thyroid disease    Trigeminal neuralgia 03/29/2016    Past Surgical History:  Procedure Laterality Date   CHOLECYSTECTOMY     CORONARY ANGIOPLASTY WITH STENT PLACEMENT     CORONARY ARTERY BYPASS GRAFT  x 5   TOTAL KNEE ARTHROPLASTY     TOTAL SHOULDER REPLACEMENT      Current Medications: Current Meds  Medication Sig   allopurinol (ZYLOPRIM) 100 MG tablet Take 100 mg by mouth 2 (two) times daily.   aspirin EC 81 MG tablet Take 81 mg by mouth daily.   carvedilol (COREG) 25 MG tablet TAKE 1/2 TABLET BY MOUTH TWICE DAILY   dicyclomine (BENTYL) 10 MG capsule take 1 capsule by mouth four times a day if needed for INTESTINAL SPASMS   furosemide (LASIX) 40 MG tablet TAKE 1 TABLET BY MOUTH ONCE (1) DAILY   levothyroxine (SYNTHROID, LEVOTHROID) 100 MCG tablet Take 100 mcg by mouth daily.   nitroGLYCERIN (NITROSTAT) 0.4 MG SL tablet Place 0.4 mg under the tongue every 5 (five) minutes as needed.     pantoprazole (PROTONIX) 40 MG tablet Take 40 mg by mouth 2 (two) times daily.   potassium chloride SA (KLOR-CON) 20 MEQ tablet TAKE 1 TABLET BY MOUTH ONCE (1) DAILY (Patient taking differently: daily as needed (Acid reflux).)   TRAVATAN Z 0.004 % SOLN ophthalmic solution Place 1 drop into both eyes at bedtime.      Allergies:   Ace inhibitors, Rosuvastatin, and Statins   Social History   Socioeconomic History   Marital status: Married    Spouse name: Not on file   Number of children: Not on file   Years of education: Not on file   Highest education level: Not on file  Occupational History   Not on file  Tobacco Use   Smoking status: Former    Types: Cigarettes    Quit date: 12/05/1979  Years since quitting: 40.8   Smokeless tobacco: Never  Vaping Use   Vaping Use: Never used  Substance and Sexual Activity   Alcohol use: No   Drug use: No   Sexual activity: Not on file  Other Topics Concern   Not on file  Social History Narrative   Not on file   Social Determinants of Health   Financial Resource Strain: Not on file  Food Insecurity: Not on file  Transportation Needs: Not on file  Physical Activity: Not on file  Stress: Not on file  Social Connections: Not on file     Family History: The patient's family history includes Asthma in his sister; CAD in his mother; Stroke in his father; Thyroid cancer in his sister. ROS:   Please see the history of present illness.    All other systems reviewed and are negative.  EKGs/Labs/Other Studies Reviewed:    The following studies were reviewed today:    Physical Exam:    VS:  BP 123/79 (BP Location: Left Arm, Patient Position: Sitting, Cuff Size: Normal)   Pulse 86   Ht 6' 2.5" (1.892 m)   Wt 200 lb 6.4 oz (90.9 kg)   SpO2 97%   BMI 25.39 kg/m     Wt Readings from Last 3 Encounters:  09/16/20 200 lb 6.4 oz (90.9 kg)  03/13/20 201 lb (91.2 kg)  07/26/19 199 lb (90.3 kg)     GEN:  Well nourished, well developed  in no acute distress HEENT: Normal NECK: No JVD; No carotid bruits LYMPHATICS: No lymphadenopathy CARDIAC: RRR, no murmurs, rubs, gallops RESPIRATORY:  Clear to auscultation without rales, wheezing or rhonchi  ABDOMEN: Soft, non-tender, non-distended MUSCULOSKELETAL:  No edema; No deformity  SKIN: Warm and dry NEUROLOGIC:  Alert and oriented x 3 PSYCHIATRIC:  Normal affect    Signed, Shirlee More, MD  09/16/2020 2:33 PM    [facility]

## 2020-09-16 ENCOUNTER — Other Ambulatory Visit: Payer: Self-pay

## 2020-09-16 ENCOUNTER — Ambulatory Visit (INDEPENDENT_AMBULATORY_CARE_PROVIDER_SITE_OTHER): Payer: Medicare Other | Admitting: Cardiology

## 2020-09-16 ENCOUNTER — Encounter: Payer: Self-pay | Admitting: Cardiology

## 2020-09-16 VITALS — BP 123/79 | HR 86 | Ht 74.5 in | Wt 200.4 lb

## 2020-09-16 DIAGNOSIS — I712 Thoracic aortic aneurysm, without rupture: Secondary | ICD-10-CM

## 2020-09-16 DIAGNOSIS — I25119 Atherosclerotic heart disease of native coronary artery with unspecified angina pectoris: Secondary | ICD-10-CM | POA: Diagnosis not present

## 2020-09-16 DIAGNOSIS — E782 Mixed hyperlipidemia: Secondary | ICD-10-CM

## 2020-09-16 DIAGNOSIS — I110 Hypertensive heart disease with heart failure: Secondary | ICD-10-CM | POA: Diagnosis not present

## 2020-09-16 DIAGNOSIS — I7121 Aneurysm of the ascending aorta, without rupture: Secondary | ICD-10-CM

## 2020-09-16 NOTE — Patient Instructions (Signed)

## 2020-10-09 DIAGNOSIS — M2021 Hallux rigidus, right foot: Secondary | ICD-10-CM

## 2020-10-09 HISTORY — DX: Hallux rigidus, right foot: M20.21

## 2020-10-22 ENCOUNTER — Other Ambulatory Visit: Payer: Self-pay | Admitting: Cardiology

## 2020-10-30 DIAGNOSIS — M792 Neuralgia and neuritis, unspecified: Secondary | ICD-10-CM | POA: Insufficient documentation

## 2020-10-30 HISTORY — DX: Neuralgia and neuritis, unspecified: M79.2

## 2021-01-15 ENCOUNTER — Other Ambulatory Visit: Payer: Self-pay | Admitting: Cardiology

## 2021-01-22 ENCOUNTER — Other Ambulatory Visit: Payer: Self-pay | Admitting: Cardiology

## 2021-03-23 NOTE — Progress Notes (Signed)
Cardiology Office Note:    Date:  03/24/2021   ID:  Jacob Weber, DOB Nov 21, 1940, MRN [id number]  PCP:  Algis Greenhouse, MD  Cardiologist:  Shirlee More, MD    Referring MD: Algis Greenhouse, MD    ASSESSMENT:    1. Coronary artery disease involving native coronary artery of native heart with angina pectoris ([place])   2. Hypertensive heart disease with heart failure ([place])   3. Aneurysm of ascending aorta without rupture   4. Mixed hyperlipidemia   5. Statin intolerance    PLAN:    In order of problems listed above:  Overall Jacob is doing well,  having no angina following remote CABG and on current medical therapy including his beta-blocker and aspirin.  He would not accept lipid-lowering therapy but I would not check a lipid profile. Stable heart failure is compensated continue his loop diuretic He will need a follow-up CT of the chest later this year   Next appointment: 6 months   Medication Adjustments/Labs and Tests Ordered: Current medicines are reviewed at length with the patient today.  Concerns regarding medicines are outlined above.  No orders of the defined types were placed in this encounter.  No orders of the defined types were placed in this encounter.   Chief Complaint  Patient presents with   Follow-up   Coronary Artery Disease   Congestive Heart Failure    History of Present Illness:    Jacob Weber is a 81 y.o. male with a hx of CAD with remote bypass surgery in 1997 heart failure ejection fraction 50 to 55% July 2021 dyslipidemia with statin intolerance hypertensive heart disease with heart failure COPD lung nodule and dilated ascending aorta 43 mmHg last seen 09/16/2020.  Compliance with diet, lifestyle and medications: Yes  From a cardiology perspective doing well having no angina edema shortness of breath chest pain palpitation or syncope. He has been intolerant of statins as well as Zetia will not except a PCSK9 inhibitor alternative  treatment such as bempedoic acid. Predominant problem is weakness and fatigue but he remains active.  Recent labs with his PCP 01/03/2021 potassium 3.7 sodium 137 creatinine 1.13 GFR 65 cc/min   Past Medical History:  Diagnosis Date   Acquired hypothyroidism 03/13/2015   Acute bronchitis due to other specified organisms 04/22/2016   Angina pectoris ([place]) 03/29/2016   Ascending aortic aneurysm ([place]) 06/11/2016   Overview:  2018: CT 4.2 cm   Cardiomyopathy, secondary ([place]) 12/04/2014   Chronic combined systolic and diastolic heart failure ([place]) 12/04/2014   Chronic coronary artery disease 12/04/2014   Overview:  1. MPS 08/11/2010 with EF 56% and mild apical and septal iscjhemia 2. Cath 03/17/2011 with CTO of LAD and RCA, LTA is patent to D, SVG is patent to the RCA, SVG to LAD is chronically occluded, EF 58%   Chronic cough 06/23/2016   Overview:  Onset 2015.  CT chest mild changes.    Last Assessment & Plan:  Here for evaluation of his upper airway from pulmonology due to chronic cough. Recently had his PPI increased to twice a day and was started on an inhaler.  He has noticed improvement in his cough, but it still bothersome.  No ACE inhibitor.  Cough seems to be much worse in the spring. EXAM by indirect laryngoscopy shows normal true vocal cords move well on phonation.  3+ edema of the arytenoid. PLAN: Reassured his larynx looks okay.  Mild arytenoid edema suggestive of reflux-induced irritation.  I agree with everything that has been done.  Let us see how he does with his new inhaler twice a day PPI therapy for the month of June.  If he still persists with a bothersome cough, he will call to set up allergy testing.  Call sooner if symptoms are worsening.   Coronary artery disease    Disease of thyroid gland 10/15/2016   Emphysema of lung ([place]) 06/11/2016   Overview:  2018: mild changes on CT, remote cigs   Esophagitis, erosive 03/29/2016   Overview:  (2012) DX/RX: EGD, CHRONIC PPI NO GI RECHECK;    Essential tremor 03/29/2016   GERD (gastroesophageal reflux disease)    Hyperlipidemia 12/04/2014   Hypertension    Hypertensive heart disease with heart failure ([place]) 12/04/2014   Irritable bowel syndrome without diarrhea 02/28/2015   MI (myocardial infarction) ([place]) 10/15/2016   Mixed hyperlipidemia 12/04/2014   Pneumonitis 06/10/2016   Overview:  2018   Preventative health care 04/11/2015   Screening for prostate cancer 04/12/2015   Overview:  2017: DRE declined   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     Allergies:   Ace inhibitors, Rosuvastatin, and Statins   Social History   Socioeconomic History   Marital status: Married    Spouse name: Not on file   Number of children: Not on file   Years of education: Not on file   Highest education level: Not on file  Occupational History   Not on file  Tobacco Use   Smoking status: Former    Types: Cigarettes    Quit date: 12/05/1979    Years since quitting: 41.3   Smokeless tobacco: Never  Vaping Use   Vaping Use: Never used  Substance and Sexual Activity   Alcohol use: No   Drug use: No   Sexual activity: Not on file  Other Topics Concern   Not on file  Social History Narrative   Not on file   Social Determinants of Health   Financial Resource Strain: Not on file  Food Insecurity: Not on file  Transportation Needs: Not on file  Physical Activity: Not on file  Stress: Not on file  Social Connections:  Not on file     Family History: The patient's family history includes Asthma in his sister; CAD in his mother; Stroke in his father; Thyroid cancer in his sister. ROS:   Please see the history of present illness.    All other systems reviewed and are negative.  EKGs/Labs/Other Studies Reviewed:    The following studies were reviewed today:  EKG:  EKG ordered today and personally reviewed.  The ekg ordered today demonstrates sinus rhythm normal   Physical Exam:    VS:  BP 118/64 (BP Location: Right Arm)    Pulse 79    Ht 6' 2.5" (1.892 m)    Wt 190 lb (86.2 kg)    SpO2 98%  BMI 24.07 kg/m     Wt Readings from Last 3 Encounters:  03/24/21 190 lb (86.2 kg)  09/16/20 200 lb 6.4 oz (90.9 kg)  03/13/20 201 lb (91.2 kg)     GEN:  Well nourished, well developed in no acute distress HEENT: Normal NECK: No JVD; No carotid bruits LYMPHATICS: No lymphadenopathy CARDIAC: RRR, no murmurs, rubs, gallops RESPIRATORY:  Clear to auscultation without rales, wheezing or rhonchi  ABDOMEN: Soft, non-tender, non-distended MUSCULOSKELETAL:  No edema; No deformity  SKIN: Warm and dry NEUROLOGIC:  Alert and oriented x 3 PSYCHIATRIC:  Normal affect    Signed, Shirlee More, MD  03/24/2021 11:28 AM    [facility]

## 2021-03-24 ENCOUNTER — Ambulatory Visit (INDEPENDENT_AMBULATORY_CARE_PROVIDER_SITE_OTHER): Payer: Medicare Other | Admitting: Cardiology

## 2021-03-24 ENCOUNTER — Other Ambulatory Visit: Payer: Self-pay

## 2021-03-24 VITALS — BP 118/64 | HR 79 | Ht 74.5 in | Wt 190.0 lb

## 2021-03-24 DIAGNOSIS — Z789 Other specified health status: Secondary | ICD-10-CM

## 2021-03-24 DIAGNOSIS — I110 Hypertensive heart disease with heart failure: Secondary | ICD-10-CM

## 2021-03-24 DIAGNOSIS — I7121 Aneurysm of the ascending aorta, without rupture: Secondary | ICD-10-CM

## 2021-03-24 DIAGNOSIS — I25119 Atherosclerotic heart disease of native coronary artery with unspecified angina pectoris: Secondary | ICD-10-CM

## 2021-03-24 DIAGNOSIS — E782 Mixed hyperlipidemia: Secondary | ICD-10-CM | POA: Diagnosis not present

## 2021-03-24 NOTE — Patient Instructions (Signed)
Medication Instructions:  ?Your physician recommends that you continue on your current medications as directed. Please refer to the Current Medication list given to you today. ? ?*If you need a refill on your cardiac medications before your next appointment, please call your pharmacy* ? ? ?Lab Work: ?NONE ?If you have labs (blood work) drawn today and your tests are completely normal, you will receive your results only by: ?MyChart Message (if you have MyChart) OR ?A paper copy in the mail ?If you have any lab test that is abnormal or we need to change your treatment, we will call you to review the results. ? ? ?Testing/Procedures: ?NONE ? ? ?Follow-Up: ?At [facility], you and your health needs are our priority.  As part of our continuing mission to provide you with exceptional heart care, we have created designated Provider Care Teams.  These Care Teams include your primary Cardiologist (physician) and Advanced Practice Providers (APPs -  Physician Assistants and Nurse Practitioners) who all work together to provide you with the care you need, when you need it. ? ?We recommend signing up for the patient portal called "MyChart".  Sign up information is provided on this After Visit Summary.  MyChart is used to connect with patients for Virtual Visits (Telemedicine).  Patients are able to view lab/test results, encounter notes, upcoming appointments, etc.  Non-urgent messages can be sent to your provider as well.   ?To learn more about what you can do with MyChart, go to [email or web address].   ? ?Your next appointment:   ?6 month(s) ? ?The format for your next appointment:   ?In Person ? ?Provider:   ?Brian Munley, MD  ? ? ?Other Instructions ?  ?

## 2021-07-24 ENCOUNTER — Other Ambulatory Visit: Payer: Self-pay | Admitting: Cardiology

## 2021-09-25 ENCOUNTER — Ambulatory Visit: Payer: Medicare Other | Attending: Cardiology | Admitting: Cardiology

## 2021-09-25 ENCOUNTER — Encounter: Payer: Self-pay | Admitting: Cardiology

## 2021-09-25 VITALS — BP 108/70 | HR 78 | Ht 74.0 in | Wt 190.8 lb

## 2021-09-25 DIAGNOSIS — I110 Hypertensive heart disease with heart failure: Secondary | ICD-10-CM | POA: Insufficient documentation

## 2021-09-25 DIAGNOSIS — I25119 Atherosclerotic heart disease of native coronary artery with unspecified angina pectoris: Secondary | ICD-10-CM | POA: Insufficient documentation

## 2021-09-25 DIAGNOSIS — E782 Mixed hyperlipidemia: Secondary | ICD-10-CM | POA: Insufficient documentation

## 2021-09-25 DIAGNOSIS — Z789 Other specified health status: Secondary | ICD-10-CM | POA: Insufficient documentation

## 2021-09-25 DIAGNOSIS — I7121 Aneurysm of the ascending aorta, without rupture: Secondary | ICD-10-CM | POA: Diagnosis present

## 2021-09-25 NOTE — Patient Instructions (Signed)
Medication Instructions:  Your physician recommends that you continue on your current medications as directed. Please refer to the Current Medication list given to you today.  *If you need a refill on your cardiac medications before your next appointment, please call your pharmacy*   Lab Work: None If you have labs (blood work) drawn today and your tests are completely normal, you will receive your results only by: MyChart Message (if you have MyChart) OR A paper copy in the mail If you have any lab test that is abnormal or we need to change your treatment, we will call you to review the results.   Testing/Procedures: None   Follow-Up: At [facility], you and your health needs are our priority.  As part of our continuing mission to provide you with exceptional heart care, we have created designated Provider Care Teams.  These Care Teams include your primary Cardiologist (physician) and Advanced Practice Providers (APPs -  Physician Assistants and Nurse Practitioners) who all work together to provide you with the care you need, when you need it.  We recommend signing up for the patient portal called "MyChart".  Sign up information is provided on this After Visit Summary.  MyChart is used to connect with patients for Virtual Visits (Telemedicine).  Patients are able to view lab/test results, encounter notes, upcoming appointments, etc.  Non-urgent messages can be sent to your provider as well.   To learn more about what you can do with MyChart, go to [email or web address].    Your next appointment:   6 month(s)  The format for your next appointment:   In Person  Provider:   Brian Munley, MD    Other Instructions None  Important Information About Sugar       

## 2021-09-25 NOTE — Progress Notes (Signed)
Cardiology Office Note:    Date:  09/25/2021   ID:  Jacob Weber, DOB March 03, 1940, MRN [id number]  PCP:  Algis Greenhouse, MD  Cardiologist:  Shirlee More, MD    Referring MD: Algis Greenhouse, MD    ASSESSMENT:    1. Coronary artery disease involving native coronary artery of native heart with angina pectoris ([place])   2. Hypertensive heart disease with heart failure ([place])   3. Mixed hyperlipidemia   4. Statin intolerance   5. Aneurysm of ascending aorta without rupture ([facility])    PLAN:    In order of problems listed above:  Overall Jacob Weber continues to do well with CAD having no anginal discomfort and will continue his treatment including aspirin beta-blocker and he is not on lipid-lowering therapy he has declined PCSK9 inhibitor and has been intolerant of statins. Stable continue his current loop diuretic beta-blocker. Controlled statin intolerant declines nonstatin treatment Consider repeat CT scan after his next visit   Next appointment: 6 months   Medication Adjustments/Labs and Tests Ordered: Current medicines are reviewed at length with the patient today.  Concerns regarding medicines are outlined above.  No orders of the defined types were placed in this encounter.  No orders of the defined types were placed in this encounter.   Chief Complaint  Patient presents with   Follow-up   Congestive Heart Failure   Coronary Artery Disease    History of Present Illness:    Jacob Weber is a 81 y.o. male with a hx of CAD with remote CABG in 1997 heart failure with an ejection fraction of the range of 50 to 55% July 2021 dyslipidemia with statin intolerance hypertensive heart disease with heart failure COPD lung nodule and mildly dilated ascending aorta 43 mm last seen 03/24/2021.  Compliance with diet, lifestyle and medications: Yes  He follows at [facility] his most recent labs from 06/05/2021 GFR 67 cc hemoglobin 14.2 sodium 137 potassium 4.2 cholesterol 212 LDL  148  His predominant problem is visual changes and he is having diplopia in the right eye as [place] see a retinal specialist He is fatigued at the end of the day but no orthopnea shortness of breath chest pain palpitation or syncope Rarely he notices edema at the end of the day Past Medical History:  Diagnosis Date   Acquired hypothyroidism 03/13/2015   Acute bronchitis due to other specified organisms 04/22/2016   Angina pectoris ([place]) 03/29/2016   Ascending aortic aneurysm ([place]) 06/11/2016   Overview:  2018: CT 4.2 cm   Cardiomyopathy, secondary ([place]) 12/04/2014   Chronic combined systolic and diastolic heart failure ([place]) 12/04/2014   Chronic coronary artery disease 12/04/2014   Overview:  1. MPS 08/11/2010 with EF 56% and mild apical and septal iscjhemia 2. Cath 03/17/2011 with CTO of LAD and RCA, LTA is patent to D, SVG is patent to the RCA, SVG to LAD is chronically occluded, EF 58%   Chronic cough 06/23/2016   Overview:  Onset 2015.  CT chest mild changes.    Last Assessment & Plan:  Here for evaluation of his upper airway from pulmonology due to chronic cough. Recently had his PPI increased to twice a day and was started on an inhaler.  He has noticed improvement in his cough, but it still bothersome.  No ACE inhibitor.  Cough seems to be much worse in the spring. EXAM by indirect laryngoscopy shows normal true vocal cords move well on phonation.  3+ edema of  the arytenoid. PLAN: Reassured his larynx looks okay.  Mild arytenoid edema suggestive of reflux-induced irritation.  I agree with everything that has been done.  Let us see how he does with his new inhaler twice a day PPI therapy for the month of June.  If he still persists with a bothersome cough, he will call to set up allergy testing.  Call sooner if symptoms are worsening.   Coronary artery disease    Disease of thyroid gland 10/15/2016   Emphysema of lung ([place]) 06/11/2016   Overview:  2018: mild changes on CT, remote cigs    Esophagitis, erosive 03/29/2016   Overview:  (2012) DX/RX: EGD, CHRONIC PPI NO GI RECHECK;   Essential tremor 03/29/2016   GERD (gastroesophageal reflux disease)    Hyperlipidemia 12/04/2014   Hypertension    Hypertensive heart disease with heart failure ([place]) 12/04/2014   Irritable bowel syndrome without diarrhea 02/28/2015   MI (myocardial infarction) ([place]) 10/15/2016   Mixed hyperlipidemia 12/04/2014   Pneumonitis 06/10/2016   Overview:  2018   Preventative health care 04/11/2015   Screening for prostate cancer 04/12/2015   Overview:  2017: DRE declined   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   allopurinol (ZYLOPRIM) 300 MG tablet Take 300 mg by mouth in the morning.   aspirin EC 81 MG tablet Take 81 mg by mouth daily.   carvedilol (COREG) 25 MG tablet TAKE 1/2 TABLET BY MOUTH TWICE DAILY   colchicine 0.6 MG tablet Take 0.6 mg by mouth 2 (two) times daily as needed. Gout   dicyclomine (BENTYL) 10 MG capsule take 1 capsule by mouth four times a day if needed for INTESTINAL SPASMS   furosemide (LASIX) 40 MG tablet TAKE 1 TABLET BY MOUTH ONCE (1) DAILY 1   levothyroxine (SYNTHROID, LEVOTHROID) 100 MCG tablet Take 100 mcg by mouth daily.   nitroGLYCERIN (NITROSTAT) 0.4 MG SL tablet Place 0.4 mg under the tongue every 5 (five) minutes as needed for chest pain.   pantoprazole (PROTONIX) 40 MG tablet Take 40 mg by mouth 2 (two) times daily.   potassium chloride SA (KLOR-CON M) 20 MEQ tablet TAKE  1 TABLET BY MOUTH ONCE (1) DAILY   TRAVATAN Z 0.004 % SOLN ophthalmic solution Place 1 drop into both eyes at bedtime.      Allergies:   Ace inhibitors, Rosuvastatin, and Statins   Social History   Socioeconomic History   Marital status: Married    Spouse name: Not on file   Number of children: Not on file   Years of education: Not on file   Highest education level: Not on file  Occupational History   Not on file  Tobacco Use   Smoking status: Former    Types: Cigarettes    Quit date: 12/05/1979    Years since quitting: 41.8   Smokeless tobacco: Never  Vaping Use   Vaping Use: Never used  Substance and Sexual Activity   Alcohol use: No   Drug use: No   Sexual activity: Not on file  Other Topics Concern   Not on file  Social History Narrative   Not on file   Social Determinants of Health   Financial Resource Strain: Not on file  Food Insecurity: Not on file  Transportation Needs: Not on file  Physical Activity: Not on file  Stress: Not on file  Social Connections: Not on file     Family History: The patient's family history includes Asthma in his sister; CAD in his mother; Stroke in his father; Thyroid cancer in his sister. ROS:   Please see the history of present illness.    All other systems reviewed and are negative.  EKGs/Labs/Other Studies Reviewed:    The following studies were reviewed today:    Physical Exam:    VS:  BP 108/70   Pulse 78   Ht '6\' 2"'$  (1.88 m)   Wt 190 lb 12.8 oz (86.5 kg)   SpO2 98%   BMI 24.50 kg/m     Wt Readings from Last 3 Encounters:  09/25/21 190 lb 12.8 oz (86.5 kg)  03/24/21 190 lb (86.2 kg)  09/16/20 200 lb 6.4 oz (90.9 kg)     GEN:  Well nourished, well developed in no acute distress HEENT: Normal NECK: No JVD; No carotid bruits LYMPHATICS: No lymphadenopathy CARDIAC: RRR, no murmurs, rubs, gallops RESPIRATORY:  Clear to auscultation without rales, wheezing or rhonchi  ABDOMEN: Soft, non-tender,  non-distended MUSCULOSKELETAL:  No edema; No deformity  SKIN: Warm and dry NEUROLOGIC:  Alert and oriented x 3 PSYCHIATRIC:  Normal affect    Signed, Shirlee More, MD  09/25/2021 9:20 AM    [facility]

## 2021-10-21 ENCOUNTER — Other Ambulatory Visit: Payer: Self-pay

## 2021-10-21 MED ORDER — POTASSIUM CHLORIDE CRYS ER 20 MEQ PO TBCR: EXTENDED_RELEASE_TABLET | ORAL | 3 refills | Status: DC

## 2021-10-21 MED ORDER — FUROSEMIDE 40 MG PO TABS: ORAL_TABLET | ORAL | 3 refills | Status: DC

## 2021-10-21 NOTE — Telephone Encounter (Signed)
Refill sent to pharmacy.   

## 2021-11-18 DIAGNOSIS — H409 Unspecified glaucoma: Secondary | ICD-10-CM | POA: Insufficient documentation

## 2021-11-18 DIAGNOSIS — R7303 Prediabetes: Secondary | ICD-10-CM

## 2021-11-18 DIAGNOSIS — R221 Localized swelling, mass and lump, neck: Secondary | ICD-10-CM | POA: Diagnosis present

## 2021-11-18 HISTORY — DX: Localized swelling, mass and lump, neck: R22.1

## 2021-11-18 HISTORY — DX: Unspecified glaucoma: H40.9

## 2021-11-18 HISTORY — DX: Prediabetes: R73.03

## 2021-12-12 ENCOUNTER — Telehealth: Payer: Self-pay

## 2021-12-12 MED ORDER — CARVEDILOL 25 MG PO TABS: 12.5000 mg | ORAL_TABLET | Freq: Two times a day (BID) | ORAL | 2 refills | Status: DC

## 2021-12-12 NOTE — Telephone Encounter (Signed)
Refill sent to pharmacy.   

## 2022-01-03 ENCOUNTER — Inpatient Hospital Stay (HOSPITAL_COMMUNITY)
Admission: EM | Admit: 2022-01-03 | Discharge: 2022-01-06 | DRG: 522 | Disposition: A | Discharge status: Home / Self Care | Payer: Medicare Other | Attending: Internal Medicine | Admitting: Internal Medicine

## 2022-01-03 ENCOUNTER — Other Ambulatory Visit: Payer: Self-pay

## 2022-01-03 ENCOUNTER — Inpatient Hospital Stay (HOSPITAL_COMMUNITY): Payer: Medicare Other

## 2022-01-03 ENCOUNTER — Emergency Department (HOSPITAL_COMMUNITY): Payer: Medicare Other

## 2022-01-03 DIAGNOSIS — Y92018 Other place in single-family (private) house as the place of occurrence of the external cause: Secondary | ICD-10-CM | POA: Diagnosis not present

## 2022-01-03 DIAGNOSIS — I7121 Aneurysm of the ascending aorta, without rupture: Secondary | ICD-10-CM | POA: Diagnosis present

## 2022-01-03 DIAGNOSIS — Z951 Presence of aortocoronary bypass graft: Secondary | ICD-10-CM | POA: Diagnosis not present

## 2022-01-03 DIAGNOSIS — I5042 Chronic combined systolic (congestive) and diastolic (congestive) heart failure: Secondary | ICD-10-CM | POA: Diagnosis present

## 2022-01-03 DIAGNOSIS — K219 Gastro-esophageal reflux disease without esophagitis: Secondary | ICD-10-CM | POA: Diagnosis present

## 2022-01-03 DIAGNOSIS — Z825 Family history of asthma and other chronic lower respiratory diseases: Secondary | ICD-10-CM

## 2022-01-03 DIAGNOSIS — S72001A Fracture of unspecified part of neck of right femur, initial encounter for closed fracture: Principal | ICD-10-CM | POA: Diagnosis present

## 2022-01-03 DIAGNOSIS — J449 Chronic obstructive pulmonary disease, unspecified: Secondary | ICD-10-CM | POA: Diagnosis not present

## 2022-01-03 DIAGNOSIS — W010XXA Fall on same level from slipping, tripping and stumbling without subsequent striking against object, initial encounter: Secondary | ICD-10-CM | POA: Diagnosis present

## 2022-01-03 DIAGNOSIS — Z823 Family history of stroke: Secondary | ICD-10-CM

## 2022-01-03 DIAGNOSIS — J9601 Acute respiratory failure with hypoxia: Secondary | ICD-10-CM

## 2022-01-03 DIAGNOSIS — I2510 Atherosclerotic heart disease of native coronary artery without angina pectoris: Secondary | ICD-10-CM | POA: Diagnosis present

## 2022-01-03 DIAGNOSIS — Z87891 Personal history of nicotine dependence: Secondary | ICD-10-CM

## 2022-01-03 DIAGNOSIS — Z7982 Long term (current) use of aspirin: Secondary | ICD-10-CM | POA: Diagnosis not present

## 2022-01-03 DIAGNOSIS — Y9389 Activity, other specified: Secondary | ICD-10-CM | POA: Diagnosis not present

## 2022-01-03 DIAGNOSIS — Z96659 Presence of unspecified artificial knee joint: Secondary | ICD-10-CM | POA: Diagnosis present

## 2022-01-03 DIAGNOSIS — Z955 Presence of coronary angioplasty implant and graft: Secondary | ICD-10-CM | POA: Diagnosis not present

## 2022-01-03 DIAGNOSIS — I25119 Atherosclerotic heart disease of native coronary artery with unspecified angina pectoris: Secondary | ICD-10-CM | POA: Diagnosis not present

## 2022-01-03 DIAGNOSIS — Z79899 Other long term (current) drug therapy: Secondary | ICD-10-CM | POA: Diagnosis not present

## 2022-01-03 DIAGNOSIS — J439 Emphysema, unspecified: Secondary | ICD-10-CM | POA: Diagnosis present

## 2022-01-03 DIAGNOSIS — Z96641 Presence of right artificial hip joint: Secondary | ICD-10-CM | POA: Diagnosis not present

## 2022-01-03 DIAGNOSIS — Z9049 Acquired absence of other specified parts of digestive tract: Secondary | ICD-10-CM

## 2022-01-03 DIAGNOSIS — E782 Mixed hyperlipidemia: Secondary | ICD-10-CM | POA: Diagnosis present

## 2022-01-03 DIAGNOSIS — I2582 Chronic total occlusion of coronary artery: Secondary | ICD-10-CM | POA: Diagnosis present

## 2022-01-03 DIAGNOSIS — G500 Trigeminal neuralgia: Secondary | ICD-10-CM | POA: Diagnosis present

## 2022-01-03 DIAGNOSIS — Z96619 Presence of unspecified artificial shoulder joint: Secondary | ICD-10-CM | POA: Diagnosis present

## 2022-01-03 DIAGNOSIS — I25810 Atherosclerosis of coronary artery bypass graft(s) without angina pectoris: Secondary | ICD-10-CM | POA: Diagnosis present

## 2022-01-03 DIAGNOSIS — I110 Hypertensive heart disease with heart failure: Secondary | ICD-10-CM | POA: Diagnosis present

## 2022-01-03 DIAGNOSIS — I429 Cardiomyopathy, unspecified: Secondary | ICD-10-CM | POA: Diagnosis present

## 2022-01-03 DIAGNOSIS — E039 Hypothyroidism, unspecified: Secondary | ICD-10-CM | POA: Diagnosis present

## 2022-01-03 DIAGNOSIS — I252 Old myocardial infarction: Secondary | ICD-10-CM | POA: Diagnosis not present

## 2022-01-03 DIAGNOSIS — Z79890 Hormone replacement therapy (postmenopausal): Secondary | ICD-10-CM | POA: Diagnosis not present

## 2022-01-03 DIAGNOSIS — C760 Malignant neoplasm of head, face and neck: Secondary | ICD-10-CM | POA: Diagnosis present

## 2022-01-03 DIAGNOSIS — R221 Localized swelling, mass and lump, neck: Secondary | ICD-10-CM | POA: Diagnosis present

## 2022-01-03 DIAGNOSIS — M10072 Idiopathic gout, left ankle and foot: Secondary | ICD-10-CM | POA: Diagnosis present

## 2022-01-03 DIAGNOSIS — Z888 Allergy status to other drugs, medicaments and biological substances status: Secondary | ICD-10-CM

## 2022-01-03 DIAGNOSIS — Z808 Family history of malignant neoplasm of other organs or systems: Secondary | ICD-10-CM

## 2022-01-03 DIAGNOSIS — I10 Essential (primary) hypertension: Secondary | ICD-10-CM | POA: Diagnosis not present

## 2022-01-03 DIAGNOSIS — G250 Essential tremor: Secondary | ICD-10-CM | POA: Diagnosis present

## 2022-01-03 DIAGNOSIS — Z8249 Family history of ischemic heart disease and other diseases of the circulatory system: Secondary | ICD-10-CM

## 2022-01-03 HISTORY — DX: Fracture of unspecified part of neck of right femur, initial encounter for closed fracture: S72.001A

## 2022-01-03 LAB — TSH: TSH: 5.277 u[IU]/mL — ABNORMAL HIGH (ref 0.350–4.500)

## 2022-01-03 LAB — BASIC METABOLIC PANEL
Anion gap: 10 (ref 5–15)
BUN: 19 mg/dL (ref 8–23)
CO2: 26 mmol/L (ref 22–32)
Calcium: 9 mg/dL (ref 8.9–10.3)
Chloride: 102 mmol/L (ref 98–111)
Creatinine, Ser: 1.15 mg/dL (ref 0.61–1.24)
GFR, Estimated: >60 mL/min (ref >60)
Glucose, Bld: 129 mg/dL — ABNORMAL HIGH (ref 70–99)
Potassium: 3.9 mmol/L (ref 3.5–5.1)
Sodium: 138 mmol/L (ref 135–145)

## 2022-01-03 LAB — CBC
HCT: 40.9 % (ref 39.0–52.0)
Hemoglobin: 14.2 g/dL (ref 13.0–17.0)
MCH: 34.1 pg — ABNORMAL HIGH (ref 26.0–34.0)
MCHC: 34.7 g/dL (ref 30.0–36.0)
MCV: 98.1 fL (ref 80.0–100.0)
Platelets: 151 10*3/uL (ref 150–400)
RBC: 4.17 MIL/uL — ABNORMAL LOW (ref 4.22–5.81)
RDW: 13.7 % (ref 11.5–15.5)
WBC: 8 10*3/uL (ref 4.0–10.5)
nRBC: 0 % (ref 0.0–0.2)

## 2022-01-03 LAB — TYPE AND SCREEN
ABO/RH(D): A POS
Antibody Screen: NEGATIVE

## 2022-01-03 LAB — PROTIME-INR
INR: 1.1 (ref 0.8–1.2)
Prothrombin Time: 14 seconds (ref 11.4–15.2)

## 2022-01-03 LAB — BRAIN NATRIURETIC PEPTIDE: B Natriuretic Peptide: 101.7 pg/mL — ABNORMAL HIGH (ref 0.0–100.0)

## 2022-01-03 MED ORDER — METHOCARBAMOL 1000 MG/10ML IJ SOLN: 500.0000 mg | Freq: Four times a day (QID) | INTRAVENOUS | Status: DC | PRN

## 2022-01-03 MED ORDER — CARVEDILOL 12.5 MG PO TABS
12.5000 mg | ORAL_TABLET | Freq: Two times a day (BID) | ORAL | Status: DC
  Administered 2022-01-03 – 2022-01-06 (×6): 12.5 mg via ORAL
  Filled 2022-01-03 (×6): qty 1

## 2022-01-03 MED ORDER — LEVOTHYROXINE SODIUM 100 MCG PO TABS
100.0000 ug | ORAL_TABLET | Freq: Every day | ORAL | Status: DC
  Administered 2022-01-05 – 2022-01-06 (×2): 100 ug via ORAL
  Filled 2022-01-03 (×2): qty 1

## 2022-01-03 MED ORDER — HYDROMORPHONE HCL 1 MG/ML IJ SOLN
1.0000 mg | Freq: Once | INTRAMUSCULAR | Status: AC
  Administered 2022-01-03: 1 mg via INTRAVENOUS
  Filled 2022-01-03: qty 1

## 2022-01-03 MED ORDER — ALLOPURINOL 100 MG PO TABS
100.0000 mg | ORAL_TABLET | Freq: Every day | ORAL | Status: DC
  Administered 2022-01-03 – 2022-01-05 (×3): 100 mg via ORAL
  Filled 2022-01-03 (×3): qty 1

## 2022-01-03 MED ORDER — ALLOPURINOL 300 MG PO TABS
300.0000 mg | ORAL_TABLET | Freq: Every morning | ORAL | Status: DC
  Administered 2022-01-04 – 2022-01-06 (×3): 300 mg via ORAL
  Filled 2022-01-03 (×3): qty 1

## 2022-01-03 MED ORDER — HYDROCODONE-ACETAMINOPHEN 5-325 MG PO TABS
1.0000 | ORAL_TABLET | Freq: Four times a day (QID) | ORAL | Status: DC | PRN
  Administered 2022-01-03 – 2022-01-04 (×2): 2 via ORAL
  Filled 2022-01-03 (×2): qty 2

## 2022-01-03 MED ORDER — FUROSEMIDE 40 MG PO TABS
40.0000 mg | ORAL_TABLET | Freq: Every day | ORAL | Status: DC
  Administered 2022-01-04 – 2022-01-06 (×3): 40 mg via ORAL
  Filled 2022-01-03 (×3): qty 1

## 2022-01-03 MED ORDER — METHOCARBAMOL 500 MG PO TABS
500.0000 mg | ORAL_TABLET | Freq: Four times a day (QID) | ORAL | Status: DC | PRN
  Administered 2022-01-03 – 2022-01-04 (×2): 500 mg via ORAL
  Filled 2022-01-03 (×2): qty 1

## 2022-01-03 MED ORDER — MORPHINE SULFATE (PF) 2 MG/ML IV SOLN
0.5000 mg | INTRAVENOUS | Status: DC | PRN
  Administered 2022-01-03 – 2022-01-04 (×3): 0.5 mg via INTRAVENOUS
  Filled 2022-01-03 (×3): qty 1

## 2022-01-03 MED ORDER — PANTOPRAZOLE SODIUM 40 MG PO TBEC
40.0000 mg | DELAYED_RELEASE_TABLET | Freq: Every day | ORAL | Status: DC | PRN
  Administered 2022-01-06: 40 mg via ORAL
  Filled 2022-01-03 (×2): qty 1

## 2022-01-03 MED ORDER — ASPIRIN 81 MG PO TBEC
81.0000 mg | DELAYED_RELEASE_TABLET | Freq: Every day | ORAL | Status: DC
  Administered 2022-01-04: 81 mg via ORAL
  Filled 2022-01-03: qty 1

## 2022-01-03 NOTE — Assessment & Plan Note (Signed)
Continue protonix prn

## 2022-01-03 NOTE — Assessment & Plan Note (Signed)
No current flair, continue allopurinol

## 2022-01-03 NOTE — H&P (Signed)
History and Physical    Patient: Jacob Weber DJS:[id number] DOB: Jun 07, 1940 DOA: 01/03/2022 DOS: the patient was seen and examined on 01/03/2022 PCP: Algis Greenhouse, MD  Patient coming from: Home - lives with wife. Ambulates independently.    Chief Complaint: mechanical fall with right hip pain   HPI: ELIYAS Weber is a 81 y.o. 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 He was working in his barn and tripped over extension cord and landed on his right hip. He had immediate pain and could not move/stand up.  His son was on the other side of the barn and he hollered after he fell and his son called EMS.   He does not smoke or drink. He is very active.    He has been feeling good. Denies any fever/chills, vision changes/headaches, chest pain or palpitations, shortness of breath or cough, abdominal pain, N/V/D, dysuria or leg swelling.    ER Course:  vitals: afebrile, bp: 145/87, HR; 83, RR: 18, oxygen: 95%Ra Pertinent labs: none Hip xray: nondisplaced and non comminuted right femoral neck fracture with mild varus angulation  In ED: ortho called. Given pain medication and TRH asked to admit.   Review of Systems: As mentioned in the history of present illness. All other systems reviewed and are negative. Past Medical History:  Diagnosis Date   Acquired hypothyroidism 03/13/2015   Acute bronchitis due to other specified organisms 04/22/2016   Angina pectoris ([place]) 03/29/2016   Ascending aortic aneurysm ([place]) 06/11/2016   Overview:  2018: CT 4.2 cm   Cardiomyopathy, secondary ([place]) 12/04/2014   Chronic combined systolic and diastolic heart failure ([place]) 12/04/2014   Chronic coronary artery disease 12/04/2014   Overview:  1. MPS 08/11/2010 with EF 56% and mild apical and septal iscjhemia 2. Cath  03/17/2011 with CTO of LAD and RCA, LTA is patent to D, SVG is patent to the RCA, SVG to LAD is chronically occluded, EF 58%   Chronic cough 06/23/2016   Overview:  Onset 2015.  CT chest mild changes.    Last Assessment & Plan:  Here for evaluation of his upper airway from pulmonology due to chronic cough. Recently had his PPI increased to twice a day and was started on an inhaler.  He has noticed improvement in his cough, but it still bothersome.  No ACE inhibitor.  Cough seems to be much worse in the spring. EXAM by indirect laryngoscopy shows normal true vocal cords move well on phonation.  3+ edema of the arytenoid. PLAN: Reassured his larynx looks okay.  Mild arytenoid edema suggestive of reflux-induced irritation.  I agree with everything that has been done.  Let us see how he does with his new inhaler twice a day PPI therapy for the month of June.  If he still persists with a bothersome cough, he will call to set up allergy testing.  Call sooner if symptoms are worsening.   Coronary artery disease    Disease of thyroid gland 10/15/2016   Emphysema of lung ([place]) 06/11/2016   Overview:  2018: mild changes on CT, remote cigs   Esophagitis, erosive 03/29/2016   Overview:  (2012) DX/RX: EGD, CHRONIC PPI NO GI RECHECK;   Essential tremor 03/29/2016   GERD (gastroesophageal reflux disease)    Hyperlipidemia 12/04/2014   Hypertension  Hypertensive heart disease with heart failure ([place]) 12/04/2014   Irritable bowel syndrome without diarrhea 02/28/2015   MI (myocardial infarction) ([place]) 10/15/2016   Mixed hyperlipidemia 12/04/2014   Pneumonitis 06/10/2016   Overview:  2018   Preventative health care 04/11/2015   Screening for prostate cancer 04/12/2015   Overview:  2017: DRE declined   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 He has never used smokeless tobacco. He reports that he does not drink alcohol and does not use drugs.  Allergies  Allergen Reactions   Ace Inhibitors Other (See Comments)    Muscle aches, cough   Rosuvastatin     Other reaction(s): Muscle pain   Statins Other (See Comments)    Felt bad    Family History  Problem Relation Age of Onset   Thyroid cancer Sister    Asthma Sister    CAD Mother    Stroke Father     Prior to Admission medications   Medication Sig Start Date End Date Taking? Authorizing Provider  allopurinol (ZYLOPRIM) 100 MG tablet Take 100 mg by mouth at bedtime. 02/26/20  Yes [provider]  allopurinol (ZYLOPRIM) 300 MG tablet Take 300 mg by mouth in the morning. 12/31/20  Yes [provider]  aspirin EC 81 MG tablet Take 81 mg by mouth daily.   Yes [provider]  carvedilol (COREG) 25 MG tablet Take 0.5 tablets (12.5 mg total) by mouth 2 (two) times daily. 12/12/21  Yes Richardo Priest, MD  colchicine 0.6 MG tablet Take 0.6 mg by mouth 2 (two) times daily as needed. Gout 02/22/21  Yes [provider]  Cyanocobalamin (VITAMIN B 12 PO) Take 1 tablet by mouth daily.   Yes [provider]  dicyclomine (BENTYL) 10 MG capsule take 1 capsule by mouth four times a day if needed for INTESTINAL SPASMS 03/02/16  Yes [provider]  furosemide (LASIX) 40 MG tablet TAKE 1 TABLET BY MOUTH ONCE (1) DAILY 1 10/21/21  Yes Munley, Hilton Cork, MD  levothyroxine (SYNTHROID, LEVOTHROID) 100 MCG tablet Take 100 mcg by mouth daily.   Yes [provider]  pantoprazole (PROTONIX) 40 MG tablet Take 40 mg by mouth daily as needed (acid reflux).   Yes [provider]  potassium chloride SA (KLOR-CON M) 20 MEQ tablet TAKE 1 TABLET BY MOUTH ONCE (1) DAILY 10/21/21  Yes Munley, Hilton Cork, MD  TRAVATAN Z 0.004 % SOLN ophthalmic solution Place 1 drop into both eyes at bedtime.  11/26/16  Yes [provider]  nitroGLYCERIN (NITROSTAT) 0.4 MG SL tablet Place 0.4 mg under the tongue every 5 (five) minutes as needed for chest pain.    [provider]    Physical Exam: Vitals:   01/03/22 1230 01/03/22 1300 01/03/22 1415 01/03/22 1500  BP: 123/72 123/72 (!) 149/85 (!) 140/84  Pulse: 79 79 84 88  Resp:  '18 18 18  '$ Temp:  97.9 F (36.6 C)    TempSrc:  SpO2: 93% 93% 93% 92%   General:  Appears calm and comfortable and is in NAD Eyes:  PERRL, EOMI, normal lids, iris ENT:  HOH, lips & tongue, mmm; poor dentition  Neck:  no LAD, masses or thyromegaly; no carotid bruits Cardiovascular:  RRR, no m/r/g. No LE edema.  Respiratory:   CTA bilaterally with no wheezes/rales/rhonchi.  Normal respiratory effort. Abdomen:  soft, NT, ND, NABS Back:   normal alignment, no CVAT Skin:  no rash or induration seen on limited exam Musculoskeletal:  RLE: slightly shortened and abducted. Sensation intact in foot with intact pedal pulses. No edema of the knee or ankle. No bruising on skin. TTP over entire right lateral trochanteric area and proximal femur. Foot movement intact and can move knee.  Lower extremity:  No LE edema.  Limited foot exam with no ulcerations.  2+ distal pulses. Psychiatric:  grossly normal mood and affect, speech fluent and appropriate, AOx3 Neurologic:  CN 2-12 grossly intact, moves all extremities in coordinated fashion, sensation intact   Radiological Exams on Admission: Independently reviewed - see discussion in A/P where applicable  DG Hip Unilat W or Wo Pelvis 2-3 Views Right  Result Date: 01/03/2022 CLINICAL DATA:  Fall.  Right hip pain. EXAM: DG HIP (WITH OR WITHOUT PELVIS) 2-3V RIGHT COMPARISON:  None Available. FINDINGS:  Nondisplaced, non comminuted fracture of the mid right femoral neck with mild varus angulation. No other fractures.  No bone lesions. Hip joints, SI joints and pubic symphysis are normally spaced and aligned. Bilateral iliac and femoral artery vascular calcifications. Soft tissues otherwise unremarkable. IMPRESSION: 1. Nondisplaced and non comminuted right femoral neck fracture with mild varus angulation. Electronically Signed   By: Lajean Manes M.D.   On: 01/03/2022 13:24    EKG: pending    Labs on Admission: I have personally reviewed the available labs and imaging studies at the time of the admission.  Pertinent labs:   None   Assessment and Plan: Principal Problem:   Closed right hip fracture (HCC) Active Problems:   Coronary artery disease involving native coronary artery of native heart with angina pectoris (HCC)   Chronic combined systolic and diastolic heart failure (HCC)   Neck mass   Ascending aortic aneurysm (HCC)   Hypertension   Mixed hyperlipidemia   Acquired hypothyroidism   Acute idiopathic gout of left foot   GERD (gastroesophageal reflux disease)    Assessment and Plan: * Closed right hip fracture ([place]) 81 year old presenting with mechanical fall and subsequent nondisplaced and non comminuted right femoral neck fracture with mild varus angulation  -admit to med-surg -ortho consulted with plans for OR tomorrow -NPO at midnight -hip fracture order set utilized -pain control with norco and morphine -robaxin prn  -ice prn -hold VTE lovenox -has his ASA '81mg'$  today  -SCD hose -RCRI score of 2. He has no high risk symptoms, discussed risks of surgery with him   Coronary artery disease involving native coronary artery of native heart with angina pectoris (HCC) No chest pain or shortness of breath RCRI 2. Discussed risks with him, okay for surgery  Continue medical management with coreg, ASA. 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  Followed by cardiology  Hypertension Well controlled  Continue coreg 12.'5mg'$  Bid and lasix '40mg'$  daily   Mixed hyperlipidemia Allergy to statins Declined non-statin medication   Acquired hypothyroidism Check TSH Continue home synthroid 141mg daily   Acute idiopathic gout of left foot No current flair, continue allopurinol   GERD (gastroesophageal reflux disease) Continue protonix prn     Advance Care Planning:   Code Status: Full Code   Consults: ortho: Dr. HDoran Durand  DVT Prophylaxis: SCDs   Family Communication: daughter and wife at bedside   Severity of Illness: The appropriate patient status for this patient is INPATIENT. Inpatient status is judged to be reasonable and necessary in order to provide the required intensity of service to ensure the patient's safety. The patient's presenting symptoms, physical exam findings, and initial radiographic and laboratory data in the context of their chronic comorbidities is felt to place them at high risk for further clinical deterioration. Furthermore, it is not anticipated that the patient will be medically stable for discharge from the hospital within 2 midnights of admission.   * I certify that at the point of admission it is my clinical judgment that the patient will require inpatient hospital care spanning beyond 2 midnights from the point of admission due to high intensity of service, high risk for further deterioration and high frequency of surveillance required.*  Author: AOrma Flaming MD 01/03/2022 4:21 PM  For on  call review [email or web address]

## 2022-01-03 NOTE — Assessment & Plan Note (Signed)
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

## 2022-01-03 NOTE — Assessment & Plan Note (Signed)
Well controlled  Continue coreg 12.'5mg'$  Bid and lasix '40mg'$  daily

## 2022-01-03 NOTE — ED Triage Notes (Signed)
EMS reports from home, Pt tripped over extension cord in barn and c/o right hip pain. No LOC, no blood thinners.  BP 144/76 HR 91 RR 16 Sp02 98 RA  18 LAC 65mgm Fentanyl entroute

## 2022-01-03 NOTE — Consult Note (Signed)
Pt with displace right femoral neck fracture after a fall today.  Consult note to follow.  NPO after midnight.  OR tomorrow.  Hold blood thinners.

## 2022-01-03 NOTE — Assessment & Plan Note (Signed)
No chest pain or shortness of breath RCRI 2. Discussed risks with him, okay for surgery  Continue medical management with coreg, ASA. Allergy to statin

## 2022-01-03 NOTE — Assessment & Plan Note (Signed)
mildly dilated ascending aorta 43 mm last seen 03/24/2021.  Followed by cardiology

## 2022-01-03 NOTE — Assessment & Plan Note (Addendum)
81 year old presenting with mechanical fall and subsequent nondisplaced and non comminuted right femoral neck fracture with mild varus angulation.  Patient is open to go back home after procedure instead of a rehab facility. -ortho consulted with plans for OR today -hip fracture order set utilized -pain control with norco and morphine -robaxin prn  -ice prn -hold VTE lovenox -has his ASA '81mg'$  today  -SCD hose

## 2022-01-03 NOTE — Assessment & Plan Note (Addendum)
Allergy to statins Declined non-statin medication

## 2022-01-03 NOTE — Assessment & Plan Note (Signed)
Appears euvolemic. Last echo in 2021 with low normal EF and grade 1 DD is likely age-related. -Continue coreg/lasix

## 2022-01-03 NOTE — ED Provider Notes (Signed)
[facility] DEPT Provider Note   CSN: [id number] Arrival date & time: 01/03/22  1201     History  Chief Complaint  Patient presents with   Fall   Hip Pain    Jacob Weber is a 81 y.o. male with medical history of emphysema, ascending aortic aneurysm, angina pectoris, hypothyroidism, coronary artery disease, hypertension.  Patient presents to ED for evaluation of fall.  Patient reports that prior to arrival he was out in his barn when his foot became tangled in a extension cord causing him to fall onto his right hip.  Patient denies hitting his head or losing consciousness.  The patient denies taking blood thinners.  Patient states he was on the ground for possibly 10 or 15 minutes while his son went to get help.  Patient complaining of right hip pain on evaluation with shortening and rotation.  Patient denies any headache, neck pain, back pain, loss of consciousness, lightheadedness, dizziness, weakness, nausea or vomiting.   Fall Pertinent negatives include no headaches.  Hip Pain Pertinent negatives include no headaches.       Home Medications Prior to Admission medications   Medication Sig Start Date End Date Taking? Authorizing Provider  allopurinol (ZYLOPRIM) 100 MG tablet Take 100 mg by mouth at bedtime. 02/26/20  Yes [provider]  allopurinol (ZYLOPRIM) 300 MG tablet Take 300 mg by mouth in the morning. 12/31/20  Yes [provider]  aspirin EC 81 MG tablet Take 81 mg by mouth daily.   Yes [provider]  carvedilol (COREG) 25 MG tablet Take 0.5 tablets (12.5 mg total) by mouth 2 (two) times daily. 12/12/21  Yes Richardo Priest, MD  colchicine 0.6 MG tablet Take 0.6 mg by mouth 2 (two) times daily as needed. Gout 02/22/21  Yes [provider]  Cyanocobalamin (VITAMIN B 12 PO) Take 1 tablet by mouth daily.   Yes [provider]  dicyclomine (BENTYL) 10 MG capsule take 1 capsule by mouth four times  a day if needed for INTESTINAL SPASMS 03/02/16  Yes [provider]  furosemide (LASIX) 40 MG tablet TAKE 1 TABLET BY MOUTH ONCE (1) DAILY 1 10/21/21  Yes Munley, Hilton Cork, MD  levothyroxine (SYNTHROID, LEVOTHROID) 100 MCG tablet Take 100 mcg by mouth daily.   Yes [provider]  pantoprazole (PROTONIX) 40 MG tablet Take 40 mg by mouth daily as needed (acid reflux).   Yes [provider]  potassium chloride SA (KLOR-CON M) 20 MEQ tablet TAKE 1 TABLET BY MOUTH ONCE (1) DAILY 10/21/21  Yes Munley, Hilton Cork, MD  TRAVATAN Z 0.004 % SOLN ophthalmic solution Place 1 drop into both eyes at bedtime.  11/26/16  Yes [provider]  nitroGLYCERIN (NITROSTAT) 0.4 MG SL tablet Place 0.4 mg under the tongue every 5 (five) minutes as needed for chest pain.    [provider]      Allergies    Ace inhibitors, Rosuvastatin, and Statins    Review of Systems   Review of Systems  Gastrointestinal:  Negative for nausea and vomiting.  Musculoskeletal:  Positive for arthralgias. Negative for back pain and neck pain.  Neurological:  Negative for dizziness, syncope, weakness and headaches.  All other systems reviewed and are negative.   Physical Exam Updated Vital Signs BP (!) 140/84   Pulse 88   Temp 97.9 F (36.6 C)   Resp 18   SpO2 92%  Physical Exam Vitals and nursing note reviewed.  Constitutional:  General: He is not in acute distress.    Appearance: Normal appearance. He is not ill-appearing, toxic-appearing or diaphoretic.  HENT:     Head: Normocephalic and atraumatic.     Nose: Nose normal. No congestion.     Mouth/Throat:     Mouth: Mucous membranes are moist.     Pharynx: Oropharynx is clear.  Eyes:     Extraocular Movements: Extraocular movements intact.     Conjunctiva/sclera: Conjunctivae normal.     Pupils: Pupils are equal, round, and reactive to light.  Cardiovascular:     Rate and Rhythm: Normal rate and regular rhythm.  Pulmonary:      Effort: Pulmonary effort is normal.     Breath sounds: Normal breath sounds. No wheezing.  Abdominal:     General: Abdomen is flat. Bowel sounds are normal.     Palpations: Abdomen is soft.     Tenderness: There is no abdominal tenderness.  Musculoskeletal:     Cervical back: Normal range of motion and neck supple. No tenderness.     Right hip: Tenderness present. No deformity. Decreased range of motion.     Comments: Shortening and rotation to RLE. 2+ DP pulse into right foot. NV intact.   Skin:    General: Skin is warm and dry.     Capillary Refill: Capillary refill takes less than 2 seconds.  Neurological:     Mental Status: He is alert and oriented to person, place, and time.     ED Results / Procedures / Treatments   Labs (all labs ordered are listed, but only abnormal results are displayed) Labs Reviewed  CBC - Abnormal; Notable for the following components:      Result Value   RBC 4.17 (*)    MCH 34.1 (*)    All other components within normal limits  BASIC METABOLIC PANEL - Abnormal; Notable for the following components:   Glucose, Bld 129 (*)    All other components within normal limits  PROTIME-INR  URINALYSIS, ROUTINE W REFLEX MICROSCOPIC  TYPE AND SCREEN    EKG None  Radiology DG Hip Unilat W or Wo Pelvis 2-3 Views Right  Result Date: 01/03/2022 CLINICAL DATA:  Fall.  Right hip pain. EXAM: DG HIP (WITH OR WITHOUT PELVIS) 2-3V RIGHT COMPARISON:  None Available. FINDINGS: Nondisplaced, non comminuted fracture of the mid right femoral neck with mild varus angulation. No other fractures.  No bone lesions. Hip joints, SI joints and pubic symphysis are normally spaced and aligned. Bilateral iliac and femoral artery vascular calcifications. Soft tissues otherwise unremarkable. IMPRESSION: 1. Nondisplaced and non comminuted right femoral neck fracture with mild varus angulation. Electronically Signed   By: Lajean Manes M.D.   On: 01/03/2022 13:24     Procedures Procedures   Medications Ordered in ED Medications  HYDROmorphone (DILAUDID) injection 1 mg (1 mg Intravenous Given 01/03/22 1247)  HYDROmorphone (DILAUDID) injection 1 mg (1 mg Intravenous Given 01/03/22 1512)    ED Course/ Medical Decision Making/ A&P                           Medical Decision Making Amount and/or Complexity of Data Reviewed Labs: ordered. Radiology: ordered.  Risk Prescription drug management.   81 year old male presents to the ED for evaluation of fall.  Please see HPI for further details.  On my examination the patient is afebrile and nontachycardic.  The patient sounds clear bilaterally, he is not hypoxic.  Patient abdomen  soft and compressible throughout.  The patient right hip does have shortening and rotation concerning for hip fracture.  The patient has a 2+ DP pulse on the right foot, neurovascularly intact.  Patient workup will include plain film imaging of right hip, CBC, BMP.  Will control the patient pain with 1 milligram Dilaudid initially.  Patient CBC unremarkable, no leukocytosis or anemia.  Patient BMP is unremarkable.  The patient plain film imaging of right hip shows a nondisplaced noncomminuted fracture of the right femoral neck with mild varus angulation.  Dr. Doran Durand of [facility] was consulted.  Dr. Doran Durand has stated that the patient should be admitted to the hospitalist service and they will most likely have time in the OR schedule for procedure tomorrow.  Dr. He was requested the patient remain n.p.o. after midnight tonight.  Consult to hospitalist services in place at this time 1:54 PM.  Awaiting callback.  Additional 1 mg Dilaudid ordered for pain control.  Dr. Eliberto Ivory, tried hospitalist, has returned my call.  Dr. Eliberto Ivory who agreed to admit the patient.  Patient amenable to the plan, patient made aware of the plan.  The patient is stable.    Final Clinical Impression(s) / ED Diagnoses Final diagnoses:  Closed fracture of  right hip, initial encounter [facility])    Rx / [place] Orders ED Discharge Orders     None         Lawana Chambers 01/03/22 1535    Milton Ferguson, MD 01/04/22 [phone]

## 2022-01-03 NOTE — Assessment & Plan Note (Signed)
Check TSH Continue home synthroid 126mg daily

## 2022-01-04 ENCOUNTER — Encounter (HOSPITAL_COMMUNITY)
Admission: EM | Disposition: A | Discharge status: Home / Self Care | Payer: Self-pay | Source: Home / Self Care | Attending: Internal Medicine

## 2022-01-04 ENCOUNTER — Encounter (HOSPITAL_COMMUNITY): Payer: Self-pay | Admitting: Family Medicine

## 2022-01-04 ENCOUNTER — Inpatient Hospital Stay (HOSPITAL_COMMUNITY): Payer: Medicare Other

## 2022-01-04 ENCOUNTER — Inpatient Hospital Stay (HOSPITAL_COMMUNITY): Payer: Medicare Other | Admitting: Anesthesiology

## 2022-01-04 DIAGNOSIS — S72001A Fracture of unspecified part of neck of right femur, initial encounter for closed fracture: Secondary | ICD-10-CM | POA: Diagnosis not present

## 2022-01-04 DIAGNOSIS — Z87891 Personal history of nicotine dependence: Secondary | ICD-10-CM

## 2022-01-04 DIAGNOSIS — I10 Essential (primary) hypertension: Secondary | ICD-10-CM | POA: Diagnosis not present

## 2022-01-04 DIAGNOSIS — Z96641 Presence of right artificial hip joint: Secondary | ICD-10-CM

## 2022-01-04 DIAGNOSIS — J449 Chronic obstructive pulmonary disease, unspecified: Secondary | ICD-10-CM | POA: Diagnosis not present

## 2022-01-04 DIAGNOSIS — I2510 Atherosclerotic heart disease of native coronary artery without angina pectoris: Secondary | ICD-10-CM | POA: Diagnosis not present

## 2022-01-04 DIAGNOSIS — J9601 Acute respiratory failure with hypoxia: Secondary | ICD-10-CM

## 2022-01-04 DIAGNOSIS — I25119 Atherosclerotic heart disease of native coronary artery with unspecified angina pectoris: Secondary | ICD-10-CM

## 2022-01-04 DIAGNOSIS — I5042 Chronic combined systolic (congestive) and diastolic (congestive) heart failure: Secondary | ICD-10-CM

## 2022-01-04 HISTORY — DX: Presence of right artificial hip joint: Z96.641

## 2022-01-04 HISTORY — DX: Acute respiratory failure with hypoxia: J96.01

## 2022-01-04 HISTORY — PX: TOTAL HIP ARTHROPLASTY: SHX124

## 2022-01-04 LAB — CBC
HCT: 41.9 % (ref 39.0–52.0)
Hemoglobin: 14.6 g/dL (ref 13.0–17.0)
MCH: 34.2 pg — ABNORMAL HIGH (ref 26.0–34.0)
MCHC: 34.8 g/dL (ref 30.0–36.0)
MCV: 98.1 fL (ref 80.0–100.0)
Platelets: 152 10*3/uL (ref 150–400)
RBC: 4.27 MIL/uL (ref 4.22–5.81)
RDW: 13.6 % (ref 11.5–15.5)
WBC: 9.5 10*3/uL (ref 4.0–10.5)
nRBC: 0 % (ref 0.0–0.2)

## 2022-01-04 LAB — BASIC METABOLIC PANEL
Anion gap: 9 (ref 5–15)
BUN: 20 mg/dL (ref 8–23)
CO2: 25 mmol/L (ref 22–32)
Calcium: 9.2 mg/dL (ref 8.9–10.3)
Chloride: 102 mmol/L (ref 98–111)
Creatinine, Ser: 1.05 mg/dL (ref 0.61–1.24)
GFR, Estimated: >60 mL/min (ref >60)
Glucose, Bld: 142 mg/dL — ABNORMAL HIGH (ref 70–99)
Potassium: 3.5 mmol/L (ref 3.5–5.1)
Sodium: 136 mmol/L (ref 135–145)

## 2022-01-04 LAB — URINALYSIS, ROUTINE W REFLEX MICROSCOPIC
Bacteria, UA: NONE SEEN
Bilirubin Urine: NEGATIVE
Glucose, UA: NEGATIVE mg/dL
Ketones, ur: NEGATIVE mg/dL
Leukocytes,Ua: NEGATIVE
Nitrite: NEGATIVE
Protein, ur: NEGATIVE mg/dL
Specific Gravity, Urine: 1.014 (ref 1.005–1.030)
pH: 5 (ref 5.0–8.0)

## 2022-01-04 LAB — VITAMIN D 25 HYDROXY (VIT D DEFICIENCY, FRACTURES): Vit D, 25-Hydroxy: 25.63 ng/mL — ABNORMAL LOW (ref 30–100)

## 2022-01-04 LAB — SURGICAL PCR SCREEN
MRSA, PCR: NEGATIVE
Staphylococcus aureus: POSITIVE — AB

## 2022-01-04 SURGERY — ARTHROPLASTY, HIP, TOTAL, ANTERIOR APPROACH
Anesthesia: General | Site: Hip | Laterality: Right

## 2022-01-04 MED ORDER — PHENYLEPHRINE 80 MCG/ML (10ML) SYRINGE FOR IV PUSH (FOR BLOOD PRESSURE SUPPORT)
PREFILLED_SYRINGE | INTRAVENOUS | Status: AC
  Filled 2022-01-04: qty 30

## 2022-01-04 MED ORDER — TRANEXAMIC ACID-NACL 1000-0.7 MG/100ML-% IV SOLN
INTRAVENOUS | Status: AC
  Filled 2022-01-04: qty 100

## 2022-01-04 MED ORDER — FENTANYL CITRATE (PF) 250 MCG/5ML IJ SOLN
INTRAMUSCULAR | Status: AC
  Filled 2022-01-04: qty 5

## 2022-01-04 MED ORDER — TRANEXAMIC ACID-NACL 1000-0.7 MG/100ML-% IV SOLN
1000.0000 mg | Freq: Once | INTRAVENOUS | Status: AC
  Administered 2022-01-04: 1000 mg via INTRAVENOUS
  Filled 2022-01-04: qty 100

## 2022-01-04 MED ORDER — LIDOCAINE 2% (20 MG/ML) 5 ML SYRINGE
INTRAMUSCULAR | Status: DC | PRN
  Administered 2022-01-04: 40 mg via INTRAVENOUS

## 2022-01-04 MED ORDER — OXYCODONE HCL 5 MG/5ML PO SOLN: 5.0000 mg | Freq: Once | ORAL | Status: DC | PRN

## 2022-01-04 MED ORDER — CHLORHEXIDINE GLUCONATE 4 % EX LIQD
60.0000 mL | Freq: Once | CUTANEOUS | Status: AC
  Administered 2022-01-04: 4 via TOPICAL
  Filled 2022-01-04: qty 60

## 2022-01-04 MED ORDER — MUPIROCIN 2 % EX OINT
1.0000 | TOPICAL_OINTMENT | Freq: Two times a day (BID) | CUTANEOUS | Status: DC
  Administered 2022-01-04 – 2022-01-06 (×5): 1 via NASAL
  Filled 2022-01-04: qty 22

## 2022-01-04 MED ORDER — ONDANSETRON HCL 4 MG PO TABS: 4.0000 mg | ORAL_TABLET | Freq: Four times a day (QID) | ORAL | Status: DC | PRN

## 2022-01-04 MED ORDER — PHENYLEPHRINE 80 MCG/ML (10ML) SYRINGE FOR IV PUSH (FOR BLOOD PRESSURE SUPPORT)
PREFILLED_SYRINGE | INTRAVENOUS | Status: DC | PRN
  Administered 2022-01-04: 160 ug via INTRAVENOUS
  Administered 2022-01-04: 240 ug via INTRAVENOUS
  Administered 2022-01-04 (×3): 160 ug via INTRAVENOUS
  Administered 2022-01-04: 80 ug via INTRAVENOUS
  Administered 2022-01-04: 160 ug via INTRAVENOUS
  Administered 2022-01-04: 80 ug via INTRAVENOUS
  Administered 2022-01-04: 160 ug via INTRAVENOUS

## 2022-01-04 MED ORDER — CEFAZOLIN SODIUM-DEXTROSE 2-4 GM/100ML-% IV SOLN
2.0000 g | Freq: Four times a day (QID) | INTRAVENOUS | Status: AC
  Administered 2022-01-04 (×2): 2 g via INTRAVENOUS
  Filled 2022-01-04 (×2): qty 100

## 2022-01-04 MED ORDER — ONDANSETRON HCL 4 MG/2ML IJ SOLN: 4.0000 mg | Freq: Four times a day (QID) | INTRAMUSCULAR | Status: DC | PRN

## 2022-01-04 MED ORDER — PHENOL 1.4 % MT LIQD: 1.0000 | OROMUCOSAL | Status: DC | PRN

## 2022-01-04 MED ORDER — ONDANSETRON HCL 4 MG/2ML IJ SOLN
INTRAMUSCULAR | Status: DC | PRN
  Administered 2022-01-04: 4 mg via INTRAVENOUS

## 2022-01-04 MED ORDER — POVIDONE-IODINE 10 % EX SWAB: 2.0000 | Freq: Once | CUTANEOUS | Status: DC

## 2022-01-04 MED ORDER — ENSURE PRE-SURGERY PO LIQD
296.0000 mL | Freq: Once | ORAL | Status: AC
  Administered 2022-01-04: 296 mL via ORAL
  Filled 2022-01-04: qty 296

## 2022-01-04 MED ORDER — SODIUM CHLORIDE (PF) 0.9 % IJ SOLN
INTRAMUSCULAR | Status: AC
  Filled 2022-01-04: qty 30

## 2022-01-04 MED ORDER — DOCUSATE SODIUM 100 MG PO CAPS
100.0000 mg | ORAL_CAPSULE | Freq: Two times a day (BID) | ORAL | Status: DC
  Administered 2022-01-04 – 2022-01-06 (×4): 100 mg via ORAL
  Filled 2022-01-04 (×4): qty 1

## 2022-01-04 MED ORDER — POVIDONE-IODINE 10 % EX SWAB
2.0000 | Freq: Once | CUTANEOUS | Status: AC
  Administered 2022-01-04: 2 via TOPICAL

## 2022-01-04 MED ORDER — SODIUM CHLORIDE 0.9 % IV SOLN: INTRAVENOUS | Status: DC

## 2022-01-04 MED ORDER — CEFAZOLIN SODIUM-DEXTROSE 2-4 GM/100ML-% IV SOLN: 2.0000 g | INTRAVENOUS | Status: DC

## 2022-01-04 MED ORDER — ROCURONIUM BROMIDE 10 MG/ML (PF) SYRINGE
PREFILLED_SYRINGE | INTRAVENOUS | Status: DC | PRN
  Administered 2022-01-04: 60 mg via INTRAVENOUS

## 2022-01-04 MED ORDER — SODIUM CHLORIDE (PF) 0.9 % IJ SOLN
INTRAMUSCULAR | Status: DC | PRN
  Administered 2022-01-04: 30 mL

## 2022-01-04 MED ORDER — STERILE WATER FOR IRRIGATION IR SOLN
Status: DC | PRN
  Administered 2022-01-04: 2000 mL

## 2022-01-04 MED ORDER — CEFAZOLIN SODIUM-DEXTROSE 2-4 GM/100ML-% IV SOLN
2.0000 g | INTRAVENOUS | Status: AC
  Administered 2022-01-04: 2 g via INTRAVENOUS

## 2022-01-04 MED ORDER — SUGAMMADEX SODIUM 200 MG/2ML IV SOLN
INTRAVENOUS | Status: DC | PRN
  Administered 2022-01-04: 200 mg via INTRAVENOUS

## 2022-01-04 MED ORDER — HYDROCODONE-ACETAMINOPHEN 5-325 MG PO TABS: 1.0000 | ORAL_TABLET | ORAL | Status: DC | PRN

## 2022-01-04 MED ORDER — METHOCARBAMOL 1000 MG/10ML IJ SOLN: 500.0000 mg | Freq: Four times a day (QID) | INTRAVENOUS | Status: DC | PRN

## 2022-01-04 MED ORDER — KETOROLAC TROMETHAMINE 30 MG/ML IJ SOLN
INTRAMUSCULAR | Status: AC
  Filled 2022-01-04: qty 1

## 2022-01-04 MED ORDER — LACTATED RINGERS IV SOLN: INTRAVENOUS | Status: DC | PRN

## 2022-01-04 MED ORDER — POLYETHYLENE GLYCOL 3350 17 G PO PACK
17.0000 g | PACK | Freq: Two times a day (BID) | ORAL | Status: DC
  Administered 2022-01-04 – 2022-01-05 (×3): 17 g via ORAL
  Filled 2022-01-04 (×4): qty 1

## 2022-01-04 MED ORDER — 0.9 % SODIUM CHLORIDE (POUR BTL) OPTIME
TOPICAL | Status: DC | PRN
  Administered 2022-01-04: 1000 mL

## 2022-01-04 MED ORDER — METOCLOPRAMIDE HCL 5 MG/ML IJ SOLN: 5.0000 mg | Freq: Three times a day (TID) | INTRAMUSCULAR | Status: DC | PRN

## 2022-01-04 MED ORDER — ACETAMINOPHEN 325 MG PO TABS: 325.0000 mg | ORAL_TABLET | Freq: Four times a day (QID) | ORAL | Status: DC | PRN

## 2022-01-04 MED ORDER — MENTHOL 3 MG MT LOZG: 1.0000 | LOZENGE | OROMUCOSAL | Status: DC | PRN

## 2022-01-04 MED ORDER — BUPIVACAINE-EPINEPHRINE 0.5% -1:200000 IJ SOLN
INTRAMUSCULAR | Status: DC | PRN
  Administered 2022-01-04: 30 mL

## 2022-01-04 MED ORDER — FENTANYL CITRATE PF 50 MCG/ML IJ SOSY: 25.0000 ug | PREFILLED_SYRINGE | INTRAMUSCULAR | Status: DC | PRN

## 2022-01-04 MED ORDER — ENSURE PRE-SURGERY PO LIQD
296.0000 mL | Freq: Once | ORAL | Status: DC
  Filled 2022-01-04: qty 296

## 2022-01-04 MED ORDER — CEFAZOLIN SODIUM-DEXTROSE 2-4 GM/100ML-% IV SOLN
INTRAVENOUS | Status: AC
  Filled 2022-01-04: qty 100

## 2022-01-04 MED ORDER — METOCLOPRAMIDE HCL 5 MG PO TABS: 5.0000 mg | ORAL_TABLET | Freq: Three times a day (TID) | ORAL | Status: DC | PRN

## 2022-01-04 MED ORDER — KETOROLAC TROMETHAMINE 30 MG/ML IJ SOLN
INTRAMUSCULAR | Status: DC | PRN
  Administered 2022-01-04: 30 mg

## 2022-01-04 MED ORDER — HYDROCODONE-ACETAMINOPHEN 7.5-325 MG PO TABS: 1.0000 | ORAL_TABLET | ORAL | Status: DC | PRN

## 2022-01-04 MED ORDER — BISACODYL 10 MG RE SUPP: 10.0000 mg | Freq: Every day | RECTAL | Status: DC | PRN

## 2022-01-04 MED ORDER — METHOCARBAMOL 500 MG PO TABS
500.0000 mg | ORAL_TABLET | Freq: Four times a day (QID) | ORAL | Status: DC | PRN
  Administered 2022-01-05: 500 mg via ORAL
  Filled 2022-01-04: qty 1

## 2022-01-04 MED ORDER — ASPIRIN 81 MG PO CHEW
81.0000 mg | CHEWABLE_TABLET | Freq: Two times a day (BID) | ORAL | Status: DC
  Administered 2022-01-04 – 2022-01-06 (×4): 81 mg via ORAL
  Filled 2022-01-04 (×4): qty 1

## 2022-01-04 MED ORDER — DEXAMETHASONE SODIUM PHOSPHATE 10 MG/ML IJ SOLN
10.0000 mg | Freq: Once | INTRAMUSCULAR | Status: AC
  Administered 2022-01-05: 10 mg via INTRAVENOUS
  Filled 2022-01-04: qty 1

## 2022-01-04 MED ORDER — DEXAMETHASONE SODIUM PHOSPHATE 10 MG/ML IJ SOLN
INTRAMUSCULAR | Status: DC | PRN
  Administered 2022-01-04: 10 mg via INTRAVENOUS

## 2022-01-04 MED ORDER — OXYCODONE HCL 5 MG PO TABS: 5.0000 mg | ORAL_TABLET | Freq: Once | ORAL | Status: DC | PRN

## 2022-01-04 MED ORDER — CHLORHEXIDINE GLUCONATE 4 % EX LIQD: 60.0000 mL | Freq: Once | CUTANEOUS | Status: DC

## 2022-01-04 MED ORDER — TRANEXAMIC ACID-NACL 1000-0.7 MG/100ML-% IV SOLN: 1000.0000 mg | INTRAVENOUS | Status: DC

## 2022-01-04 MED ORDER — MORPHINE SULFATE (PF) 2 MG/ML IV SOLN: 0.5000 mg | INTRAVENOUS | Status: DC | PRN

## 2022-01-04 MED ORDER — TRANEXAMIC ACID-NACL 1000-0.7 MG/100ML-% IV SOLN
1000.0000 mg | INTRAVENOUS | Status: AC
  Administered 2022-01-04: 1000 mg via INTRAVENOUS

## 2022-01-04 MED ORDER — FENTANYL CITRATE (PF) 100 MCG/2ML IJ SOLN
INTRAMUSCULAR | Status: DC | PRN
  Administered 2022-01-04 (×2): 50 ug via INTRAVENOUS

## 2022-01-04 MED ORDER — PROPOFOL 10 MG/ML IV BOLUS
INTRAVENOUS | Status: AC
  Filled 2022-01-04: qty 20

## 2022-01-04 MED ORDER — DIPHENHYDRAMINE HCL 12.5 MG/5ML PO ELIX: 12.5000 mg | ORAL_SOLUTION | ORAL | Status: DC | PRN

## 2022-01-04 MED ORDER — PROPOFOL 10 MG/ML IV BOLUS
INTRAVENOUS | Status: DC | PRN
  Administered 2022-01-04: 120 mg via INTRAVENOUS

## 2022-01-04 MED ORDER — BUPIVACAINE-EPINEPHRINE (PF) 0.5% -1:200000 IJ SOLN
INTRAMUSCULAR | Status: AC
  Filled 2022-01-04: qty 30

## 2022-01-04 SURGICAL SUPPLY — 43 items
ADH SKN CLS APL DERMABOND .7 (GAUZE/BANDAGES/DRESSINGS) ×1
ARTICULEZE HEAD (Hips) ×1 IMPLANT
BAG COUNTER SPONGE SURGICOUNT (BAG) IMPLANT
BAG DECANTER FOR FLEXI CONT (MISCELLANEOUS) IMPLANT
BAG SPEC THK2 15X12 ZIP CLS (MISCELLANEOUS)
BAG SPNG CNTER NS LX DISP (BAG)
BAG ZIPLOCK 12X15 (MISCELLANEOUS) IMPLANT
BLADE SAG 18X100X1.27 (BLADE) ×2 IMPLANT
COVER PERINEAL POST (MISCELLANEOUS) ×2 IMPLANT
COVER SURGICAL LIGHT HANDLE (MISCELLANEOUS) ×2 IMPLANT
CUP ACET PINNACLE SECTR 58MM (Hips) IMPLANT
DERMABOND ADVANCED .7 DNX12 (GAUZE/BANDAGES/DRESSINGS) ×2 IMPLANT
DRAPE FOOT SWITCH (DRAPES) ×2 IMPLANT
DRAPE STERI IOBAN 125X83 (DRAPES) ×2 IMPLANT
DRAPE U-SHAPE 47X51 STRL (DRAPES) ×4 IMPLANT
DRESSING AQUACEL AG SP 3.5X10 (GAUZE/BANDAGES/DRESSINGS) ×2 IMPLANT
DRSG AQUACEL AG ADV 3.5X10 (GAUZE/BANDAGES/DRESSINGS) IMPLANT
DRSG AQUACEL AG SP 3.5X10 (GAUZE/BANDAGES/DRESSINGS) ×1
DURAPREP 26ML APPLICATOR (WOUND CARE) ×2 IMPLANT
ELECT REM PT RETURN 15FT ADLT (MISCELLANEOUS) ×2 IMPLANT
GLOVE BIO SURGEON STRL SZ 6 (GLOVE) ×2 IMPLANT
GLOVE BIOGEL PI IND STRL 6.5 (GLOVE) ×2 IMPLANT
GLOVE BIOGEL PI IND STRL 7.5 (GLOVE) ×2 IMPLANT
GLOVE ORTHO TXT STRL SZ7.5 (GLOVE) ×4 IMPLANT
GOWN STRL REUS W/ TWL LRG LVL3 (GOWN DISPOSABLE) ×4 IMPLANT
GOWN STRL REUS W/TWL LRG LVL3 (GOWN DISPOSABLE) ×2
HEAD ARTICULEZE (Hips) IMPLANT
HOLDER FOLEY CATH W/STRAP (MISCELLANEOUS) ×2 IMPLANT
KIT TURNOVER KIT A (KITS) IMPLANT
LINER NEUTRAL 36X58 PLUS4 IMPLANT
PACK ANTERIOR HIP CUSTOM (KITS) ×2 IMPLANT
PINNACLE SECTOR CUP 58MM (Hips) ×1 IMPLANT
SCREW 6.5MMX35MM (Screw) IMPLANT
STEM FEM ACTIS HIGH SZ10 (Stem) IMPLANT
SUT MNCRL AB 4-0 PS2 18 (SUTURE) ×2 IMPLANT
SUT STRATAFIX 0 PDS 27 VIOLET (SUTURE) ×1
SUT VIC AB 1 CT1 36 (SUTURE) ×6 IMPLANT
SUT VIC AB 2-0 CT1 27 (SUTURE) ×2
SUT VIC AB 2-0 CT1 TAPERPNT 27 (SUTURE) ×4 IMPLANT
SUTURE STRATFX 0 PDS 27 VIOLET (SUTURE) ×2 IMPLANT
TRAY FOLEY MTR SLVR 16FR STAT (SET/KITS/TRAYS/PACK) IMPLANT
TUBE SUCTION HIGH CAP CLEAR NV (SUCTIONS) ×2 IMPLANT
WATER STERILE IRR 1000ML POUR (IV SOLUTION) ×2 IMPLANT

## 2022-01-04 NOTE — Discharge Instructions (Signed)

## 2022-01-04 NOTE — Op Note (Signed)
NAME:  Jacob Weber                ACCOUNT NO.: [id number]      MEDICAL RECORD NO.: [id number]      FACILITY:  [facility]      PHYSICIAN:  Mauri Pole  DATE OF BIRTH:  [birth year]     DATE OF PROCEDURE:  01/04/2022                                 OPERATIVE REPORT         PREOPERATIVE DIAGNOSIS: Right  femoral neck fracture      POSTOPERATIVE DIAGNOSIS:  Right hip femoral neck fracture.      PROCEDURE:  Right total hip replacement through an anterior approach   utilizing DePuy THR system, component size 58 mm pinnacle cup, a size 36+4 neutral   Altrex liner, a size 10 Hi Actis stem with a 36+5 Articuleze metal head ball     SURGEON:  Pietro Cassis. Alvan Dame, M.D.      ASSISTANT: Costella Hatcher, PA-C     ANESTHESIA:  General.      SPECIMENS:  None.      COMPLICATIONS:  None.      BLOOD LOSS:  350 cc     DRAINS:  None.      INDICATION OF THE PROCEDURE:  Jacob Weber is a 81 y.o. male who presented to the emergency room after a fall while at home.  He fell onto his right side.  He had immediate onset of right hip pain with inability to bear weight.  Radiographs in the emergency room revealed a displaced femoral neck fracture.  Orthopedics was consulted.  He was admitted to the hospitalist service per routine.  Recommendations to proceed with total hip arthroplasty were made to manage his hip fracture and pain control but also to prevent long-term complications potentially due to necessary to convert to total hip replacement from hemiarthroplasty.  The risk of infection DVT dislocation neurovascular injury need for future surgeries were discussed.  Consent was obtained for management of his pain as well as hip fracture.     PROCEDURE IN DETAIL:  The patient was brought to operative theater.   Once adequate anesthesia, preoperative antibiotics, 2 gm of Ancef, 1 gm of Tranexamic Acid, and 10 mg of Decadron were administered, the patient was positioned supine on the  [employer] table.  Once the patient was safely positioned with adequate padding of boney prominences we predraped out the hip, and used fluoroscopy to confirm orientation of the pelvis.      The right hip was then prepped and draped from proximal iliac crest to   mid thigh with a shower curtain technique.      Time-out was performed identifying the patient, planned procedure, and the appropriate extremity.     An incision was then made 2 cm lateral to the   anterior superior iliac spine extending over the orientation of the   tensor fascia lata muscle and sharp dissection was carried down to the   fascia of the muscle.      The fascia was then incised.  The muscle belly was identified and swept   laterally and retractor placed along the superior neck.  Following   cauterization of the circumflex vessels and removing some pericapsular   fat, a second cobra retractor was placed on the inferior neck.  A T-capsulotomy was made along the line of the   superior neck to the trochanteric fossa, then extended proximally and   distally.  Tag sutures were placed and the retractors were then placed   intracapsular.  We then identified the trochanteric fossa and   orientation of my neck cut and then made a neck osteotomy with the femur on traction.  The fractured femoral neck segment and the femoral head were removed without difficulty or complication.  Traction was let   off and retractors were placed posterior and anterior around the   acetabulum.      The labrum and foveal tissue were debrided.  I began reaming with a 51 mm   reamer and reamed up to 57 mm reamer with good bony bed preparation and a 58 mm  cup was chosen.  The final 58 mm Pinnacle cup was then impacted under fluoroscopy to confirm the depth of penetration and orientation with respect to   Abduction and forward flexion.  A screw was placed into the ilium followed by the hole eliminator.  The final   36+4 neutral Altrex liner was  impacted with good visualized rim fit.  The cup was positioned anatomically within the acetabular portion of the pelvis.      At this point, the femur was rolled to 100 degrees.  Further capsule was   released off the inferior aspect of the femoral neck.  I then   released the superior capsule proximally.  With the leg in a neutral position the hook was placed laterally   along the femur under the vastus lateralis origin and elevated manually and then held in position using the hook attachment on the bed.  The leg was then extended and adducted with the leg rolled to 100   degrees of external rotation.  Retractors were placed along the medial calcar and posteriorly over the greater trochanter.  Once the proximal femur was fully   exposed, I used a box osteotome to set orientation.  I then began   broaching with the starting chili pepper broach and passed this by hand and then broached up to 10.  With the 10 broach in place I chose a high offset neck and did several trial reductions.  The offset was appropriate, leg lengths   appeared to be equal best matched with the +5 head ball trial confirmed radiographically.   Given these findings, I went ahead and dislocated the hip, repositioned all   retractors and positioned the right hip in the extended and abducted position.  The final 10 Hi Actis stem was   chosen and it was impacted down to the level of neck cut.  Based on this   and the trial reductions, a final 36+5 Articuleze metal head delta ceramic ball was chosen and   impacted onto a clean and dry trunnion, and the hip was reduced.  The   hip had been irrigated throughout the case again at this point.  I did   reapproximate the superior capsular leaflet to the anterior leaflet   using #1 Vicryl.  The fascia of the   tensor fascia lata muscle was then reapproximated using #1 Vicryl and #0 Stratafix sutures.  The   remaining wound was closed with 2-0 Vicryl and running 4-0 Monocryl.   The hip  was cleaned, dried, and dressed sterilely using Dermabond and   Aquacel dressing.  The patient was then brought   to recovery room in stable condition tolerating the procedure  well.   Costella Hatcher, PA-C was present for the entirety of the case involved from   preoperative positioning, perioperative retractor management, general   facilitation of the case, as well as primary wound closure as assistant.            Pietro Cassis Alvan Dame, M.D.        01/04/2022 10:49 AM

## 2022-01-04 NOTE — Transfer of Care (Signed)
Immediate Anesthesia Transfer of Care Note  Patient: Jacob Weber  Procedure(s) Performed: TOTAL HIP ARTHROPLASTY ANTERIOR APPROACH (Right: Hip)  Patient Location: PACU  Anesthesia Type:General  Level of Consciousness: awake, alert , and oriented  Airway & Oxygen Therapy: Patient Spontanous Breathing and Patient connected to face mask oxygen  Post-op Assessment: Report given to RN and Post -op Vital signs reviewed and stable  Post vital signs: Reviewed and stable  Last Vitals:  Vitals Value Taken Time  BP    Temp    Pulse    Resp    SpO2      Last Pain:  Vitals:   01/04/22 0932  TempSrc: Oral  PainSc:       Patients Stated Pain Goal: 2 (50/41/36 4383)  Complications: No notable events documented.

## 2022-01-04 NOTE — Hospital Course (Signed)
Jacob Weber is a 81 y.o. 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  He went to the OR on 12/10 total right hip replacement.

## 2022-01-04 NOTE — H&P (View-Only) (Signed)
Reason for Consult: right hip fracture Referring Physician: Posey Pronto, MD  Jacob Weber is an 81 y.o. male.  HPI: Jacob Weber is a 81 y.o. 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 He was working in his barn and tripped over extension cord and landed on his right hip. He had immediate pain and could not move/stand up.  His son was on the other side of the barn and he hollered after he fell and his son called EMS.    I have replaced his left knee - and he reports he has been doing very well it and no complaints of any injury or problem with this  Past Medical History:  Diagnosis Date   Acquired hypothyroidism 03/13/2015   Acute bronchitis due to other specified organisms 04/22/2016   Angina pectoris ([place]) 03/29/2016   Ascending aortic aneurysm ([place]) 06/11/2016   Overview:  2018: CT 4.2 cm   Cardiomyopathy, secondary ([place]) 12/04/2014   Chronic combined systolic and diastolic heart failure ([place]) 12/04/2014   Chronic coronary artery disease 12/04/2014   Overview:  1. MPS 08/11/2010 with EF 56% and mild apical and septal iscjhemia 2. Cath 03/17/2011 with CTO of LAD and RCA, LTA is patent to D, SVG is patent to the RCA, SVG to LAD is chronically occluded, EF 58%   Chronic cough 06/23/2016   Overview:  Onset 2015.  CT chest mild changes.    Last Assessment & Plan:  Here for evaluation of his upper airway from pulmonology due to chronic cough. Recently had his PPI increased to twice a day and was started on an inhaler.  He has noticed improvement in his cough, but it still bothersome.  No ACE inhibitor.  Cough seems to be much worse in the spring. EXAM by indirect laryngoscopy shows normal true vocal cords move well on phonation.  3+ edema of the arytenoid. PLAN: Reassured his larynx looks okay.  Mild arytenoid edema  suggestive of reflux-induced irritation.  I agree with everything that has been done.  Let us see how he does with his new inhaler twice a day PPI therapy for the month of June.  If he still persists with a bothersome cough, he will call to set up allergy testing.  Call sooner if symptoms are worsening.   Coronary artery disease    Disease of thyroid gland 10/15/2016   Emphysema of lung ([place]) 06/11/2016   Overview:  2018: mild changes on CT, remote cigs   Esophagitis, erosive 03/29/2016   Overview:  (2012) DX/RX: EGD, CHRONIC PPI NO GI RECHECK;   Essential tremor 03/29/2016   GERD (gastroesophageal reflux disease)    Hyperlipidemia 12/04/2014   Hypertension    Hypertensive heart disease with heart failure ([place]) 12/04/2014   Irritable bowel syndrome without diarrhea 02/28/2015   MI (myocardial infarction) ([place]) 10/15/2016   Mixed hyperlipidemia 12/04/2014   Pneumonitis 06/10/2016   Overview:  2018   Preventative health care 04/11/2015   Screening for prostate cancer 04/12/2015   Overview:  2017: DRE declined   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 He has never used smokeless tobacco. He reports that he does not drink alcohol and does not use drugs.  Allergies:  Allergies  Allergen Reactions   Ace Inhibitors Other (See Comments)    Muscle aches, cough   Rosuvastatin     Other reaction(s): Muscle pain   Statins Other (See  Comments)    Felt bad    Medications: I have reviewed the patient's current medications. Scheduled:  allopurinol  100 mg Oral QHS   allopurinol  300 mg Oral q AM   aspirin EC  81 mg Oral Daily   carvedilol  12.5 mg Oral BID   chlorhexidine  60 mL Topical Once   furosemide  40 mg Oral Daily   levothyroxine  100 mcg Oral Q0600   mupirocin ointment  1 Application Nasal BID   povidone-iodine  2 Application Topical Once    Results for orders placed or performed during the hospital encounter of 01/03/22 (from the past 24 hour(s))  CBC     Status: Abnormal   Collection Time: 01/03/22 12:47 PM  Result Value Ref Range   WBC 8.0 4.0 - 10.5 K/uL   RBC 4.17 (L) 4.22 - 5.81 MIL/uL   Hemoglobin 14.2 13.0 - 17.0 g/dL   HCT 40.9 39.0 - 52.0 %   MCV 98.1 80.0 - 100.0 fL   MCH 34.1 (H) 26.0 - 34.0 pg   MCHC 34.7 30.0 - 36.0 g/dL   RDW 13.7 11.5 - 15.5 %   Platelets 151 150 - 400 K/uL   nRBC 0.0 0.0 - 0.2 %  Basic metabolic panel     Status: Abnormal   Collection Time: 01/03/22 12:47 PM  Result Value Ref Range   Sodium 138 135 - 145 mmol/L   Potassium 3.9 3.5 - 5.1 mmol/L   Chloride 102 98 - 111 mmol/L   CO2 26 22 - 32 mmol/L   Glucose, Bld 129 (H) 70 - 99 mg/dL   BUN 19 8 - 23 mg/dL   Creatinine, Ser 1.15 0.61 - 1.24 mg/dL   Calcium 9.0 8.9 - 10.3 mg/dL   GFR, Estimated >60 >60 mL/min   Anion gap 10 5 - 15  Type and screen [place]     Status: None   Collection Time: 01/03/22  7:40 PM  Result Value Ref Range   ABO/RH(D) A POS    Antibody Screen NEG    Sample Expiration      01/06/2022,2359 Performed at [facility], [place] [address] [place], [place] [address]   Protime-INR     Status: None   Collection Time: 01/03/22  7:42 PM  Result Value Ref Range   Prothrombin Time 14.0 11.4 - 15.2 seconds   INR 1.1 0.8 - 1.2  Brain natriuretic peptide     Status: Abnormal   Collection Time: 01/03/22  7:42 PM  Result Value Ref Range   B Natriuretic  Peptide 101.7 (H) 0.0 - 100.0 pg/mL  TSH     Status: Abnormal   Collection Time: 01/03/22  7:42 PM  Result Value Ref Range   TSH 5.277 (H) 0.350 - 4.500 uIU/mL  Surgical PCR screen     Status: Abnormal   Collection Time: 01/03/22 10:46 PM   Specimen: Nasal Mucosa; Nasal Swab  Result Value Ref Range   MRSA, PCR NEGATIVE NEGATIVE   Staphylococcus aureus POSITIVE (A) NEGATIVE  Urinalysis, Routine w reflex microscopic Urine, Clean Catch     Status: Abnormal   Collection Time: 01/04/22  1:45 AM  Result Value Ref Range   Color, Urine YELLOW YELLOW   APPearance CLEAR CLEAR   Specific Gravity, Urine 1.014 1.005 - 1.030   pH 5.0 5.0 - 8.0   Glucose, UA NEGATIVE NEGATIVE mg/dL   Hgb urine dipstick MODERATE (A) NEGATIVE   Bilirubin Urine NEGATIVE NEGATIVE   Ketones, ur NEGATIVE NEGATIVE mg/dL   Protein, ur NEGATIVE NEGATIVE mg/dL   Nitrite NEGATIVE NEGATIVE   Leukocytes,Ua NEGATIVE NEGATIVE   RBC / HPF 0-5 0 - 5 RBC/hpf   WBC, UA 0-5 0 - 5 WBC/hpf   Bacteria, UA NONE SEEN NONE SEEN   Squamous Epithelial / LPF 0-5 0 - 5  CBC     Status: Abnormal   Collection Time: 01/04/22  4:22 AM  Result Value Ref Range   WBC 9.5 4.0 - 10.5 K/uL   RBC 4.27 4.22 - 5.81 MIL/uL   Hemoglobin 14.6 13.0 - 17.0 g/dL   HCT 41.9 39.0 - 52.0 %   MCV 98.1 80.0 - 100.0 fL   MCH 34.2 (H) 26.0 - 34.0 pg   MCHC 34.8 30.0 - 36.0 g/dL   RDW 13.6 11.5 - 15.5 %   Platelets 152 150 - 400 K/uL   nRBC 0.0 0.0 - 0.2 %  Basic metabolic panel     Status: Abnormal   Collection Time: 01/04/22  4:22 AM  Result Value Ref Range   Sodium 136 135 - 145 mmol/L   Potassium 3.5 3.5 - 5.1 mmol/L   Chloride 102 98 - 111 mmol/L   CO2 25 22 - 32 mmol/L   Glucose, Bld 142 (H) 70 - 99 mg/dL   BUN 20 8 - 23 mg/dL   Creatinine, Ser 1.05 0.61 - 1.24 mg/dL   Calcium 9.2 8.9 - 10.3 mg/dL   GFR, Estimated >60 >60 mL/min   Anion gap 9 5 - 15    X-ray: Narrative & Impression  CLINICAL DATA:  Fall.  Right hip pain.   EXAM: DG HIP  (WITH OR WITHOUT PELVIS) 2-3V RIGHT   COMPARISON:  None Available.   FINDINGS: Nondisplaced, non comminuted fracture of the mid right femoral neck with mild varus angulation.   No other fractures.  No bone lesions.   Hip joints, SI joints and pubic symphysis are normally spaced and aligned.   Bilateral iliac and femoral artery vascular calcifications. Soft tissues otherwise unremarkable.   IMPRESSION: 1. Nondisplaced and non comminuted right femoral neck fracture with mild varus angulation.     Electronically Signed   By: Lajean Manes M.D.    ROS: As per HPI  Blood pressure 132/77, pulse 81, temperature 97.6 F (36.4 C), temperature source Oral, resp. rate 16, SpO2 97 %.  Physical Exam: General:  Appears calm and comfortable and is in NAD Eyes:  PERRL, EOMI, normal lids, iris ENT:  HOH, lips & tongue, mmm; poor dentition  Neck:  no LAD, masses or thyromegaly; no carotid bruits Cardiovascular:  RRR, no m/r/g. No LE edema.  Respiratory:   CTA bilaterally with  no wheezes/rales/rhonchi.  Normal respiratory effort. Abdomen:  soft, NT, ND, NABS Back:   normal alignment, no CVAT Skin:  no rash or induration seen on limited exam Musculoskeletal:  RLE: slightly shortened and abducted with external rotation. Sensation intact in foot with intact pedal pulses. No edema of the knee or ankle. No bruising on skin. TTP over entire right lateral trochanteric area and proximal femur. Foot movement intact and can move knee.  Lower extremity:  No LE edema.  Limited foot exam with no ulcerations.  2+ distal pulses. Psychiatric:  grossly normal mood and affect, speech fluent and appropriate, AOx3 Neurologic:  CN 2-12 grossly intact, moves all extremities in coordinated fashion, sensation intact  Assessment/Plan: Right hip femoral neck fracture  Plan: To OR today for right THR NPO Rationale for treatment reviewed as well as post operative course and expectations Consent  ordered  Mauri Pole 01/04/2022, 8:35 AM

## 2022-01-04 NOTE — Progress Notes (Addendum)
Progress Note   Patient: Jacob Weber QKM:[id number] DOB: 1940-10-23 DOA: 01/03/2022     1 DOS: the patient was seen and examined on 01/04/2022   Brief hospital course: Jacob Weber is a 81 y.o. 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  He went to the OR on 12/10 total right hip replacement.  Assessment and Plan: * Closed right hip fracture ([place]) 81 year old presenting with mechanical fall and subsequent nondisplaced and non comminuted right femoral neck fracture with mild varus angulation.  Patient is open to go back home after procedure instead of a rehab facility. -ortho consulted with plans for OR today -hip fracture order set utilized -pain control with norco and morphine -robaxin prn  -ice prn -hold VTE lovenox -has his ASA '81mg'$  today  -SCD hose   Coronary artery disease involving native coronary artery of native heart with angina pectoris (HCC) No chest pain or shortness of breath Continue medical management with coreg, ASA. Allergy to statin   Chronic combined systolic and diastolic heart failure (HCC) Appears euvolemic. Last echo in 2021 with low normal EF and grade 1 DD is likely age-related. 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  Followed by cardiology  Hypertension Well controlled  Continue coreg 12.'5mg'$  Bid and lasix '40mg'$  daily   Mixed hyperlipidemia Allergy to statins Declined non-statin medication   Acquired hypothyroidism Check TSH Continue home synthroid 113mg daily   Acute idiopathic gout of left foot No  current flair, continue allopurinol   GERD (gastroesophageal reflux disease) Continue protonix prn         Subjective: Patient reports pain in right hip otherwise noted complaints  Physical Exam: Vitals:   01/04/22 1326 01/04/22 1330 01/04/22 1345 01/04/22 1358  BP:  134/75 124/71   Pulse:  79 81 84  Resp:  16 14   Temp:      TempSrc:      SpO2: 99% 94% 93%    Physical Exam Vitals and nursing note reviewed.  Constitutional:      General: He is not in acute distress.    Appearance: He is not diaphoretic.  HENT:     Head: Atraumatic.  Eyes:     Pupils: Pupils are equal, round, and reactive to light.  Cardiovascular:     Rate and Rhythm: Normal rate and regular rhythm.     Pulses: Normal pulses.     Heart sounds: No murmur heard. Pulmonary:     Effort: Pulmonary effort is normal. No respiratory distress.     Breath sounds: Normal breath sounds. No rales.  Abdominal:     General: Abdomen is flat. There is no distension.     Palpations: Abdomen is soft.     Tenderness: There is no abdominal tenderness. There is no rebound.  Musculoskeletal:     Right lower leg: No edema.     Left lower leg: No edema.  Skin:    General: Skin is warm and dry.     Capillary Refill: Capillary refill takes less than 2 seconds.  Neurological:     Mental Status: He is alert and oriented  to person, place, and time. Mental status is at baseline.  Psychiatric:        Mood and Affect: Mood normal.     Data Reviewed:     Latest Ref Rng & Units 01/04/2022    4:22 AM 01/03/2022   12:47 PM 07/26/2019    1:38 PM  CBC  WBC 4.0 - 10.5 K/uL 9.5  8.0  6.3   Hemoglobin 13.0 - 17.0 g/dL 14.6  14.2  14.3   Hematocrit 39.0 - 52.0 % 41.9  40.9  40.7   Platelets 150 - 400 K/uL 152  151  201       Latest Ref Rng & Units 01/04/2022    4:22 AM 01/03/2022   12:47 PM 03/13/2020    2:00 PM  BMP  Glucose 70 - 99 mg/dL 142  129  98   BUN 8 - 23 mg/dL '20  19  15   '$ Creatinine 0.61 - 1.24 mg/dL 1.05   1.15  1.01   BUN/Creat Ratio 10 - 24   15   Sodium 135 - 145 mmol/L 136  138  138   Potassium 3.5 - 5.1 mmol/L 3.5  3.9  4.1   Chloride 98 - 111 mmol/L 102  102  99   CO2 22 - 32 mmol/L '25  26  22   '$ Calcium 8.9 - 10.3 mg/dL 9.2  9.0  9.3    XR hip 12/9: Nondisplaced and non comminuted right femoral neck fracture with mild varus angulation.    Family Communication: Rest with son at bedside  Disposition: Status is: Inpatient Remains inpatient appropriate because: Hip fracture  Planned Discharge Destination: Home with Home Health    Time spent: 35 minutes  Author: Lorelei Pont, MD 01/04/2022 2:15 PM  For on call review [email or web address].

## 2022-01-04 NOTE — Interval H&P Note (Signed)
History and Physical Interval Note:  01/04/2022 10:47 AM  Jacob Weber  has presented today for surgery, with the diagnosis of right femoral neck fracture.  The various methods of treatment have been discussed with the patient and family. After consideration of risks, benefits and other options for treatment, the patient has consented to  Procedure(s): TOTAL HIP ARTHROPLASTY ANTERIOR APPROACH (Right) as a surgical intervention.  The patient's history has been reviewed, patient examined, no change in status, stable for surgery.  I have reviewed the patient's chart and labs.  Questions were answered to the patient's satisfaction.     Mauri Pole

## 2022-01-04 NOTE — Anesthesia Procedure Notes (Signed)
Procedure Name: Intubation Date/Time: 01/04/2022 12:14 PM  Performed by: Gean Maidens, CRNAPre-anesthesia Checklist: Patient identified, Emergency Drugs available, Suction available, Patient being monitored and Timeout performed Patient Re-evaluated:Patient Re-evaluated prior to induction Oxygen Delivery Method: Circle system utilized Preoxygenation: Pre-oxygenation with 100% oxygen Induction Type: IV induction Ventilation: Mask ventilation without difficulty Laryngoscope Size: Mac and 4 Grade View: Grade I Tube type: Oral Tube size: 7.5 mm Number of attempts: 1 Airway Equipment and Method: Stylet Placement Confirmation: ETT inserted through vocal cords under direct vision, positive ETCO2 and breath sounds checked- equal and bilateral Secured at: 23 cm Tube secured with: Tape Dental Injury: Teeth and Oropharynx as per pre-operative assessment

## 2022-01-04 NOTE — Consult Note (Signed)
Reason for Consult: right hip fracture Referring Physician: Posey Pronto, MD  Jacob Weber is an 81 y.o. male.  HPI: Jacob Weber is a 81 y.o. 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 He was working in his barn and tripped over extension cord and landed on his right hip. He had immediate pain and could not move/stand up.  His son was on the other side of the barn and he hollered after he fell and his son called EMS.    I have replaced his left knee - and he reports he has been doing very well it and no complaints of any injury or problem with this  Past Medical History:  Diagnosis Date   Acquired hypothyroidism 03/13/2015   Acute bronchitis due to other specified organisms 04/22/2016   Angina pectoris ([place]) 03/29/2016   Ascending aortic aneurysm ([place]) 06/11/2016   Overview:  2018: CT 4.2 cm   Cardiomyopathy, secondary ([place]) 12/04/2014   Chronic combined systolic and diastolic heart failure ([place]) 12/04/2014   Chronic coronary artery disease 12/04/2014   Overview:  1. MPS 08/11/2010 with EF 56% and mild apical and septal iscjhemia 2. Cath 03/17/2011 with CTO of LAD and RCA, LTA is patent to D, SVG is patent to the RCA, SVG to LAD is chronically occluded, EF 58%   Chronic cough 06/23/2016   Overview:  Onset 2015.  CT chest mild changes.    Last Assessment & Plan:  Here for evaluation of his upper airway from pulmonology due to chronic cough. Recently had his PPI increased to twice a day and was started on an inhaler.  He has noticed improvement in his cough, but it still bothersome.  No ACE inhibitor.  Cough seems to be much worse in the spring. EXAM by indirect laryngoscopy shows normal true vocal cords move well on phonation.  3+ edema of the arytenoid. PLAN: Reassured his larynx looks okay.  Mild arytenoid edema  suggestive of reflux-induced irritation.  I agree with everything that has been done.  Let us see how he does with his new inhaler twice a day PPI therapy for the month of June.  If he still persists with a bothersome cough, he will call to set up allergy testing.  Call sooner if symptoms are worsening.   Coronary artery disease    Disease of thyroid gland 10/15/2016   Emphysema of lung ([place]) 06/11/2016   Overview:  2018: mild changes on CT, remote cigs   Esophagitis, erosive 03/29/2016   Overview:  (2012) DX/RX: EGD, CHRONIC PPI NO GI RECHECK;   Essential tremor 03/29/2016   GERD (gastroesophageal reflux disease)    Hyperlipidemia 12/04/2014   Hypertension    Hypertensive heart disease with heart failure ([place]) 12/04/2014   Irritable bowel syndrome without diarrhea 02/28/2015   MI (myocardial infarction) ([place]) 10/15/2016   Mixed hyperlipidemia 12/04/2014   Pneumonitis 06/10/2016   Overview:  2018   Preventative health care 04/11/2015   Screening for prostate cancer 04/12/2015   Overview:  2017: DRE declined   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 He has never used smokeless tobacco. He reports that he does not drink alcohol and does not use drugs.  Allergies:  Allergies  Allergen Reactions   Ace Inhibitors Other (See Comments)    Muscle aches, cough   Rosuvastatin     Other reaction(s): Muscle pain   Statins Other (See  Comments)    Felt bad    Medications: I have reviewed the patient's current medications. Scheduled:  allopurinol  100 mg Oral QHS   allopurinol  300 mg Oral q AM   aspirin EC  81 mg Oral Daily   carvedilol  12.5 mg Oral BID   chlorhexidine  60 mL Topical Once   furosemide  40 mg Oral Daily   levothyroxine  100 mcg Oral Q0600   mupirocin ointment  1 Application Nasal BID   povidone-iodine  2 Application Topical Once    Results for orders placed or performed during the hospital encounter of 01/03/22 (from the past 24 hour(s))  CBC     Status: Abnormal   Collection Time: 01/03/22 12:47 PM  Result Value Ref Range   WBC 8.0 4.0 - 10.5 K/uL   RBC 4.17 (L) 4.22 - 5.81 MIL/uL   Hemoglobin 14.2 13.0 - 17.0 g/dL   HCT 40.9 39.0 - 52.0 %   MCV 98.1 80.0 - 100.0 fL   MCH 34.1 (H) 26.0 - 34.0 pg   MCHC 34.7 30.0 - 36.0 g/dL   RDW 13.7 11.5 - 15.5 %   Platelets 151 150 - 400 K/uL   nRBC 0.0 0.0 - 0.2 %  Basic metabolic panel     Status: Abnormal   Collection Time: 01/03/22 12:47 PM  Result Value Ref Range   Sodium 138 135 - 145 mmol/L   Potassium 3.9 3.5 - 5.1 mmol/L   Chloride 102 98 - 111 mmol/L   CO2 26 22 - 32 mmol/L   Glucose, Bld 129 (H) 70 - 99 mg/dL   BUN 19 8 - 23 mg/dL   Creatinine, Ser 1.15 0.61 - 1.24 mg/dL   Calcium 9.0 8.9 - 10.3 mg/dL   GFR, Estimated >60 >60 mL/min   Anion gap 10 5 - 15  Type and screen Pink     Status: None   Collection Time: 01/03/22  7:40 PM  Result Value Ref Range   ABO/RH(D) A POS    Antibody Screen NEG    Sample Expiration      01/06/2022,2359 Performed at [facility], [place] [address] [place], [place] [address]   Protime-INR     Status: None   Collection Time: 01/03/22  7:42 PM  Result Value Ref Range   Prothrombin Time 14.0 11.4 - 15.2 seconds   INR 1.1 0.8 - 1.2  Brain natriuretic peptide     Status: Abnormal   Collection Time: 01/03/22  7:42 PM  Result Value Ref Range   B Natriuretic  Peptide 101.7 (H) 0.0 - 100.0 pg/mL  TSH     Status: Abnormal   Collection Time: 01/03/22  7:42 PM  Result Value Ref Range   TSH 5.277 (H) 0.350 - 4.500 uIU/mL  Surgical PCR screen     Status: Abnormal   Collection Time: 01/03/22 10:46 PM   Specimen: Nasal Mucosa; Nasal Swab  Result Value Ref Range   MRSA, PCR NEGATIVE NEGATIVE   Staphylococcus aureus POSITIVE (A) NEGATIVE  Urinalysis, Routine w reflex microscopic Urine, Clean Catch     Status: Abnormal   Collection Time: 01/04/22  1:45 AM  Result Value Ref Range   Color, Urine YELLOW YELLOW   APPearance CLEAR CLEAR   Specific Gravity, Urine 1.014 1.005 - 1.030   pH 5.0 5.0 - 8.0   Glucose, UA NEGATIVE NEGATIVE mg/dL   Hgb urine dipstick MODERATE (A) NEGATIVE   Bilirubin Urine NEGATIVE NEGATIVE   Ketones, ur NEGATIVE NEGATIVE mg/dL   Protein, ur NEGATIVE NEGATIVE mg/dL   Nitrite NEGATIVE NEGATIVE   Leukocytes,Ua NEGATIVE NEGATIVE   RBC / HPF 0-5 0 - 5 RBC/hpf   WBC, UA 0-5 0 - 5 WBC/hpf   Bacteria, UA NONE SEEN NONE SEEN   Squamous Epithelial / LPF 0-5 0 - 5  CBC     Status: Abnormal   Collection Time: 01/04/22  4:22 AM  Result Value Ref Range   WBC 9.5 4.0 - 10.5 K/uL   RBC 4.27 4.22 - 5.81 MIL/uL   Hemoglobin 14.6 13.0 - 17.0 g/dL   HCT 41.9 39.0 - 52.0 %   MCV 98.1 80.0 - 100.0 fL   MCH 34.2 (H) 26.0 - 34.0 pg   MCHC 34.8 30.0 - 36.0 g/dL   RDW 13.6 11.5 - 15.5 %   Platelets 152 150 - 400 K/uL   nRBC 0.0 0.0 - 0.2 %  Basic metabolic panel     Status: Abnormal   Collection Time: 01/04/22  4:22 AM  Result Value Ref Range   Sodium 136 135 - 145 mmol/L   Potassium 3.5 3.5 - 5.1 mmol/L   Chloride 102 98 - 111 mmol/L   CO2 25 22 - 32 mmol/L   Glucose, Bld 142 (H) 70 - 99 mg/dL   BUN 20 8 - 23 mg/dL   Creatinine, Ser 1.05 0.61 - 1.24 mg/dL   Calcium 9.2 8.9 - 10.3 mg/dL   GFR, Estimated >60 >60 mL/min   Anion gap 9 5 - 15    X-ray: Narrative & Impression  CLINICAL DATA:  Fall.  Right hip pain.   EXAM: DG HIP  (WITH OR WITHOUT PELVIS) 2-3V RIGHT   COMPARISON:  None Available.   FINDINGS: Nondisplaced, non comminuted fracture of the mid right femoral neck with mild varus angulation.   No other fractures.  No bone lesions.   Hip joints, SI joints and pubic symphysis are normally spaced and aligned.   Bilateral iliac and femoral artery vascular calcifications. Soft tissues otherwise unremarkable.   IMPRESSION: 1. Nondisplaced and non comminuted right femoral neck fracture with mild varus angulation.     Electronically Signed   By: Lajean Manes M.D.    ROS: As per HPI  Blood pressure 132/77, pulse 81, temperature 97.6 F (36.4 C), temperature source Oral, resp. rate 16, SpO2 97 %.  Physical Exam: General:  Appears calm and comfortable and is in NAD Eyes:  PERRL, EOMI, normal lids, iris ENT:  HOH, lips & tongue, mmm; poor dentition  Neck:  no LAD, masses or thyromegaly; no carotid bruits Cardiovascular:  RRR, no m/r/g. No LE edema.  Respiratory:   CTA bilaterally with  no wheezes/rales/rhonchi.  Normal respiratory effort. Abdomen:  soft, NT, ND, NABS Back:   normal alignment, no CVAT Skin:  no rash or induration seen on limited exam Musculoskeletal:  RLE: slightly shortened and abducted with external rotation. Sensation intact in foot with intact pedal pulses. No edema of the knee or ankle. No bruising on skin. TTP over entire right lateral trochanteric area and proximal femur. Foot movement intact and can move knee.  Lower extremity:  No LE edema.  Limited foot exam with no ulcerations.  2+ distal pulses. Psychiatric:  grossly normal mood and affect, speech fluent and appropriate, AOx3 Neurologic:  CN 2-12 grossly intact, moves all extremities in coordinated fashion, sensation intact  Assessment/Plan: Right hip femoral neck fracture  Plan: To OR today for right THR NPO Rationale for treatment reviewed as well as post operative course and expectations Consent  ordered  Mauri Pole 01/04/2022, 8:35 AM

## 2022-01-04 NOTE — Anesthesia Preprocedure Evaluation (Signed)
Anesthesia Evaluation  Patient identified by MRN, date of birth, ID band Patient awake    Reviewed: Allergy & Precautions, H&P , NPO status , Patient's Chart, lab work & pertinent test results  Airway Mallampati: II   Neck ROM: full    Dental   Pulmonary COPD, former smoker   breath sounds clear to auscultation       Cardiovascular hypertension, + CAD, + Past MI, + Cardiac Stents and + CABG   Rhythm:regular Rate:Normal  TTE (07/2019): EF 55%, mild dilation of ascending aorta 78m.   Neuro/Psych    GI/Hepatic PUD,GERD  ,,  Endo/Other  Hypothyroidism    Renal/GU      Musculoskeletal  (+) Arthritis ,    Abdominal   Peds  Hematology   Anesthesia Other Findings   Reproductive/Obstetrics                             Anesthesia Physical Anesthesia Plan  ASA: 3  Anesthesia Plan: General   Post-op Pain Management:    Induction: Intravenous  PONV Risk Score and Plan: 2 and Ondansetron, Dexamethasone and Treatment may vary due to age or medical condition  Airway Management Planned: Oral ETT  Additional Equipment:   Intra-op Plan:   Post-operative Plan: Extubation in OR  Informed Consent: I have reviewed the patients History and Physical, chart, labs and discussed the procedure including the risks, benefits and alternatives for the proposed anesthesia with the patient or authorized representative who has indicated his/her understanding and acceptance.     Dental advisory given  Plan Discussed with: CRNA, Anesthesiologist and Surgeon  Anesthesia Plan Comments:        Anesthesia Quick Evaluation

## 2022-01-05 ENCOUNTER — Other Ambulatory Visit: Payer: Self-pay

## 2022-01-05 DIAGNOSIS — S72001A Fracture of unspecified part of neck of right femur, initial encounter for closed fracture: Secondary | ICD-10-CM | POA: Diagnosis not present

## 2022-01-05 LAB — CBC
HCT: 35.6 % — ABNORMAL LOW (ref 39.0–52.0)
Hemoglobin: 12.3 g/dL — ABNORMAL LOW (ref 13.0–17.0)
MCH: 33.5 pg (ref 26.0–34.0)
MCHC: 34.6 g/dL (ref 30.0–36.0)
MCV: 97 fL (ref 80.0–100.0)
Platelets: 130 10*3/uL — ABNORMAL LOW (ref 150–400)
RBC: 3.67 MIL/uL — ABNORMAL LOW (ref 4.22–5.81)
RDW: 13.3 % (ref 11.5–15.5)
WBC: 15.1 10*3/uL — ABNORMAL HIGH (ref 4.0–10.5)
nRBC: 0 % (ref 0.0–0.2)

## 2022-01-05 LAB — BASIC METABOLIC PANEL
Anion gap: 10 (ref 5–15)
BUN: 26 mg/dL — ABNORMAL HIGH (ref 8–23)
CO2: 22 mmol/L (ref 22–32)
Calcium: 8.6 mg/dL — ABNORMAL LOW (ref 8.9–10.3)
Chloride: 101 mmol/L (ref 98–111)
Creatinine, Ser: 1.21 mg/dL (ref 0.61–1.24)
GFR, Estimated: 60 mL/min — ABNORMAL LOW (ref >60)
Glucose, Bld: 174 mg/dL — ABNORMAL HIGH (ref 70–99)
Potassium: 3.8 mmol/L (ref 3.5–5.1)
Sodium: 133 mmol/L — ABNORMAL LOW (ref 135–145)

## 2022-01-05 MED ORDER — ENSURE ENLIVE PO LIQD
237.0000 mL | Freq: Two times a day (BID) | ORAL | Status: DC
  Administered 2022-01-05 (×2): 237 mL via ORAL

## 2022-01-05 MED ORDER — ADULT MULTIVITAMIN W/MINERALS CH
1.0000 | ORAL_TABLET | Freq: Every day | ORAL | Status: DC
  Administered 2022-01-05 – 2022-01-06 (×2): 1 via ORAL
  Filled 2022-01-05 (×2): qty 1

## 2022-01-05 NOTE — Progress Notes (Signed)
Physical Therapy Treatment Patient Details Name: Jacob Weber MRN: [id number] DOB: 1940-04-19 Today's Date: 01/05/2022   History of Present Illness 81 y.o. 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 Dx of R femoral neck fx, s/p R AA-THA 01/04/22.    PT Comments    Pt ambulated 240' with RW, no loss of balance.  Performed R THA HEP with supervision. Will plan to do stair training tomorrow morning, then I expect he'll be ready to DC home from a PT standpoint.    Recommendations for follow up therapy are one component of a multi-disciplinary discharge planning process, led by the attending physician.  Recommendations may be updated based on patient status, additional functional criteria and insurance authorization.  Follow Up Recommendations  No PT follow up     Assistance Recommended at Discharge Set up Supervision/Assistance  Patient can return home with the following A little help with bathing/dressing/bathroom;Assistance with cooking/housework;Assist for transportation;Help with stairs or ramp for entrance   Equipment Recommendations  Rolling walker (2 wheels);BSC/3in1    Recommendations for Other Services       Precautions / Restrictions Precautions Precautions: Fall Precaution Comments: no other falls in past 6 months Restrictions Weight Bearing Restrictions: No Other Position/Activity Restrictions: WBAT     Mobility  Bed Mobility Overal bed mobility: Modified Independent             General bed mobility comments: HOB up, used rail    Transfers Overall transfer level: Needs assistance Equipment used: Rolling walker (2 wheels) Transfers: Sit to/from Stand Sit to Stand: From elevated surface, Min guard           General transfer comment: VCs hand placement     Ambulation/Gait Ambulation/Gait assistance: Supervision Gait Distance (Feet): 240 Feet Assistive device: Rolling walker (2 wheels) Gait Pattern/deviations: Step-to pattern, Decreased step length - left, Decreased step length - right, Trunk flexed Gait velocity: decr     General Gait Details: VCs sequencing and to lift head, no loss of balance   Stairs             Wheelchair Mobility    Modified Rankin (Stroke Patients Only)       Balance Overall balance assessment: Modified Independent                                          Cognition Arousal/Alertness: Awake/alert Behavior During Therapy: WFL for tasks assessed/performed Overall Cognitive Status: Within Functional Limits for tasks assessed                                          Exercises Total Joint Exercises Ankle Circles/Pumps: AROM, Both, 10 reps, Supine Heel Slides: AAROM, Right, Supine, 20 reps, 15 reps Hip ABduction/ADduction: AAROM, Right, Supine, 15 reps Long Arc Quad: AROM, Right, 5 reps, Seated    General Comments        Pertinent Vitals/Pain Pain Assessment Pain Assessment: 0-10 Pain Score: 0-No pain Pain Location: R hip Pain Descriptors / Indicators: Tightness Pain Intervention(s): Limited activity within patient's tolerance, Monitored during session, Patient requesting pain meds-RN notified, Ice applied    Home Living  Prior Function            PT Goals (current goals can now be found in the care plan section) Acute Rehab PT Goals Patient 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, gardening PT Goal Formulation: With patient/family Time For Goal Achievement: 01/12/22 Potential to Achieve Goals: Good Progress towards PT goals: Progressing toward goals    Frequency    Min 6X/week      PT Plan Current plan remains appropriate    Co-evaluation              AM-PAC PT "6 Clicks" Mobility    Outcome Measure  Help needed turning from your back to your side while in a flat bed without using bedrails?: None Help needed moving from lying on your back to sitting on the side of a flat bed without using bedrails?: A Little Help needed moving to and from a bed to a chair (including a wheelchair)?: A Little Help needed standing up from a chair using your arms (e.g., wheelchair or bedside chair)?: A Little Help needed to walk in hospital room?: A Little Help needed climbing 3-5 steps with a railing? : A Little 6 Click Score: 19    End of Session Equipment Utilized During Treatment: Gait belt Activity Tolerance: Patient tolerated treatment well Patient left: with call bell/phone within reach;in bed;with nursing/sitter in room;with bed alarm set Nurse Communication: Mobility status PT Visit Diagnosis: Difficulty in walking, not elsewhere classified (R26.2);History of falling (Z91.81) Pain - Right/Left: Right Pain - part of body: Hip     Time: 6203-5597 PT Time Calculation (min) (ACUTE ONLY): 28 min  Charges:  $Gait Training: 8-22 mins $Therapeutic Exercise: 8-22 mins                     Blondell Reveal Kistler PT 01/05/2022  Acute Rehabilitation Services  Office [phone]

## 2022-01-05 NOTE — TOC Transition Note (Signed)
Transition of Care [facility]) - CM/SW Discharge Note   Patient Details  Name: Jacob Weber MRN: [id number] Date of Birth: 12-13-1940  [facility] CM/SW Contact:  Lennart Pall, LCSW Phone Number: 01/05/2022, 1:16 PM   Clinical Narrative:    Met with pt to review dc needs.  Pt aware PT has recommended HEP and a RW for home.  No DME agency preference - order placed with [facility] for delivery to room.  No further TOC needs.   Final next level of care: Home/Self Care Barriers to Discharge: No Barriers Identified   Patient Goals and CMS Choice Patient states their goals for this hospitalization and ongoing recovery are:: return home      Discharge Placement                       Discharge Plan and Services                DME Arranged: Walker rolling DME Agency: [employer] Date DME Agency Contacted: 01/05/22 Time DME Agency Contacted: [id number] Representative spoke with at DME Agency: [place] ([place]) Interventions     Readmission Risk Interventions    01/05/2022    1:15 PM  Readmission Risk Prevention Plan  Transportation Screening Complete  PCP or Specialist Appt within 5-7 Days Complete  Home Care Screening Complete  Medication Review (RN CM) Complete

## 2022-01-05 NOTE — Progress Notes (Signed)
Patient ID: Jacob Weber, male   DOB: 1940/09/03, 81 y.o.   MRN: [id number] Subjective: 1 Day Post-Op Procedure(s) (LRB): TOTAL HIP ARTHROPLASTY ANTERIOR APPROACH (Right)    Patient reports pain as mild.  No events overnight  Objective:   VITALS:   Vitals:   01/05/22 0225 01/05/22 0520  BP: 124/73 119/71  Pulse: 89 84  Resp: 16 18  Temp: 97.7 F (36.5 C) 97.8 F (36.6 C)  SpO2: 97% 99%    Neurovascular intact Incision: dressing C/D/I  LABS Recent Labs    01/03/22 1247 01/04/22 0422 01/05/22 0410  HGB 14.2 14.6 12.3*  HCT 40.9 41.9 35.6*  WBC 8.0 9.5 15.1*  PLT 151 152 130*    Recent Labs    01/03/22 1247 01/04/22 0422 01/05/22 0410  NA 138 136 133*  K 3.9 3.5 3.8  BUN 19 20 26*  CREATININE 1.15 1.05 1.21  GLUCOSE 129* 142* 174*    Recent Labs    01/03/22 1942  INR 1.1     Assessment/Plan: 1 Day Post-Op Procedure(s) (LRB): TOTAL HIP ARTHROPLASTY ANTERIOR APPROACH (Right)   Up with therapy Plan for discharge tomorrow Likely will be able to be discharged to home tomorrow - motivated and relatively healthy '81mg'$  ASA BID for DVT prophylaxis Norco for pain

## 2022-01-05 NOTE — Progress Notes (Signed)
Initial Nutrition Assessment  INTERVENTION:   -Needs weight for admission (last recorded 8/31)  -Ensure Plus High Protein po BID, each supplement provides 350 kcal and 20 grams of protein.   -Multivitamin with minerals daily  NUTRITION DIAGNOSIS:   Increased nutrient needs related to post-op healing, hip fracture as evidenced by estimated needs.  GOAL:   Patient will meet greater than or equal to 90% of their needs  MONITOR:   PO intake, Supplement acceptance, Labs, Weight trends, I & O's  REASON FOR ASSESSMENT:   Consult Hip fracture protocol  ASSESSMENT:   81 y.o. 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  He went to the OR on 12/10 total right hip replacement.  Patient is s/p total right hip replacement 12/10. Pt currently on regular diet, consuming 0-100% of meals. Will order Ensure supplements to provide additional kcals and protein to aid in post-op healing. Per MD note, will likely d/c 12/12.  Per chart review, no weight was recorded for this admission. Last weight recorded 8/31: 190 lbs.  Medications: Colace, Lasix, Miralax  Labs reviewed:  Low Na  NUTRITION - FOCUSED PHYSICAL EXAM:  Unable to complete, working remotely  Diet Order:   Diet Order             Diet regular Room service appropriate? Yes; Fluid consistency: Thin  Diet effective now                   EDUCATION NEEDS:   No education needs have been identified at this time  Skin:  Skin Assessment: Skin Integrity Issues: Skin Integrity Issues:: Incisions Incisions: 12/10 right hip  Last BM:  12/9  Height:   Ht Readings from Last 1 Encounters:  09/25/21 '6\' 2"'$  (1.88 m)    Weight:   Wt Readings from Last 1 Encounters:  09/25/21 86.5 kg    BMI:  There is no height or weight on file to calculate  BMI.  Estimated Nutritional Needs:   Kcal:  2100-2300  Protein:  95-105g  Fluid:  2.1L/day  Clayton Bibles, MS, RD, LDN Inpatient Clinical Dietitian Contact information available via Amion

## 2022-01-05 NOTE — Evaluation (Signed)
Physical Therapy Evaluation Patient Details Name: Jacob Weber MRN: [id number] DOB: 1940-06-02 Today's Date: 01/05/2022  History of Present Illness  81 y.o. 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 Dx of R femoral neck fx, s/p R AA-THA 01/04/22.  Clinical Impression  Pt is s/p THA resulting in the deficits listed below (see PT Problem List). Pt ambulated 150' with RW, no loss of balance. Initiated THA HEP. He is progressing well with mobility. I expect he'll do well HEP and no f/u PT following acute stay. Pt will benefit from skilled PT to increase their independence and safety with mobility to allow discharge to the venue listed below.         Recommendations for follow up therapy are one component of a multi-disciplinary discharge planning process, led by the attending physician.  Recommendations may be updated based on patient status, additional functional criteria and insurance authorization.  Follow Up Recommendations No PT follow up      Assistance Recommended at Discharge Set up Supervision/Assistance  Patient can return home with the following  A little help with bathing/dressing/bathroom;Assistance with cooking/housework;Assist for transportation;Help with stairs or ramp for entrance    Equipment Recommendations Rolling walker (2 wheels)  Recommendations for Other Services       Functional Status Assessment Patient has had a recent decline in their functional status and demonstrates the ability to make significant improvements in function in a reasonable and predictable amount of time.     Precautions / Restrictions Precautions Precautions: Fall Precaution Comments: no other falls in past 6 months Restrictions Weight Bearing Restrictions: No Other Position/Activity Restrictions:  WBAT      Mobility  Bed Mobility Overal bed mobility: Modified Independent             General bed mobility comments: HOB up, used rail    Transfers Overall transfer level: Needs assistance Equipment used: Rolling walker (2 wheels) Transfers: Sit to/from Stand Sit to Stand: From elevated surface, Min guard           General transfer comment: VCs hand placement    Ambulation/Gait Ambulation/Gait assistance: Supervision Gait Distance (Feet): 150 Feet Assistive device: Rolling walker (2 wheels) Gait Pattern/deviations: Step-to pattern, Decreased step length - left, Decreased step length - right, Trunk flexed Gait velocity: decr     General Gait Details: VCs sequencing and to lift head, no loss of balance  Stairs            Wheelchair Mobility    Modified Rankin (Stroke Patients Only)       Balance Overall balance assessment: Modified Independent                                           Pertinent Vitals/Pain Pain Assessment Pain Assessment: No/denies pain    Home Living Family/patient expects to be discharged to:: Private residence Living Arrangements: Spouse/significant other Available Help at Discharge: Family;Available PRN/intermittently Type of Home: House Home Access: Stairs to enter   Entrance Stairs-Number of Steps: 1   Home Layout: One level Home Equipment: Standard Walker Additional Comments: son and daughter are available to assist (daughter is a Marine scientist)    Prior Function Prior Level of Function : Independent/Modified Independent;Driving  Mobility Comments: independent ADLs Comments: independent     Hand Dominance        Extremity/Trunk Assessment   Upper Extremity Assessment Upper Extremity Assessment: Overall WFL for tasks assessed    Lower Extremity Assessment Lower Extremity Assessment: RLE deficits/detail RLE Deficits / Details: hip flexion AAROM ~35*, knee ext at least 3/5 RLE  Sensation: WNL RLE Coordination: WNL    Cervical / Trunk Assessment Cervical / Trunk Assessment: Normal  Communication   Communication: No difficulties  Cognition Arousal/Alertness: Awake/alert Behavior During Therapy: WFL for tasks assessed/performed Overall Cognitive Status: Within Functional Limits for tasks assessed                                          General Comments      Exercises Total Joint Exercises Ankle Circles/Pumps: AROM, Both, 10 reps, Supine Heel Slides: AAROM, Right, 10 reps, Supine Hip ABduction/ADduction: AAROM, Right, 5 reps, Supine Long Arc Quad: AROM, Right, 5 reps, Seated   Assessment/Plan    PT Assessment Patient needs continued PT services  PT Problem List Decreased activity tolerance;Decreased mobility;Decreased knowledge of use of DME       PT Treatment Interventions Gait training;Therapeutic activities;Therapeutic exercise;Patient/family education;Stair training    PT Goals (Current goals can be found in the Care Plan section)  Acute Rehab PT Goals Patient 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: A Little Help needed to walk in hospital room?: A Little Help needed climbing 3-5 steps with a railing? : A Little 6 Click Score: 19    End of Session Equipment Utilized During Treatment: Gait belt Activity Tolerance:  Patient tolerated treatment well Patient left: in chair;with chair alarm set;with call bell/phone within reach;with family/visitor present Nurse Communication: Mobility status PT Visit Diagnosis: Difficulty in walking, not elsewhere classified (R26.2);Pain Pain - Right/Left: Right Pain - part of body: Hip    Time: 0737-1062 PT Time Calculation (min) (ACUTE ONLY): 32 min   Charges:   PT Evaluation $PT Eval Moderate Complexity: 1 Mod PT Treatments $Gait Training: 8-22 mins        Blondell Reveal Kistler PT 01/05/2022  Acute Rehabilitation Services  Office [phone]

## 2022-01-05 NOTE — Progress Notes (Signed)
PROGRESS NOTE  Jacob Weber  DOB: [birth year]  PCP: Algis Greenhouse, MD NFA:[id number]  DOA: 01/03/2022  LOS: 2 days  Hospital Day: 3  Brief narrative: Jacob Weber is a 81 y.o. male with PMH significant HTN, HLD, CAD/CABG, chronic combined systolic and diastolic CHF, AAA, COPD, hypothyroidism, essential tremor, GERD, trigeminal neuralgia and neck mass. 12//9, patient was brought in from home after a fall.  He apparently tripped over an extension cord, fell and immediately started hurting on the right hip  In the ED, hemodynamically stable Imaging showed an acute nondisplaced, noncommitted right femoral neck fracture with mild varus angulation. 12/10, underwent right total hip replacement  Subjective: Patient was seen and examined this morning.  Pleasant elderly Caucasian male.  Sitting up in recliner.  Not in distress.  No new symptoms.  Family at bedside.   Chart reviewed In the last 24 hours, hemodynamically stable, blood pressure in normal range, breathing on 2 L/min WBC count up to 15.1 today, BUN/creatinine 26/1.21   Assessment and plan: Closed right hip fracture  Secondary to mechanical fall 10/10, underwent right total hip replacement Pain control per orthopedics.  Currently on Norco Noted DVT prophylaxis with aspirin 81 mg twice daily Up with therapy.  Chronic, systolic and diastolic CHF Essential hypertension Euvolemic Last echo 2021 with normal EF, grade 1 diastolic dysfunction Continue Coreg, Lasix.  Stop IV fluid  CAD/CABG AAA, HLD No chest pain or shortness of breath Continue medical management with coreg, ASA. Allergy to statin.  Declined nonstatin medicine. 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 Has appointment with sarcoma surgeon at [facility] next week   Acquired  hypothyroidism Continue synthroid 146mg daily    Acute idiopathic gout of left foot No current flair, continue allopurinol    GERD Continue protonix prn   Goals of care   Code Status: Full Code    Mobility: Encourage ambulation.  Working with PT  Infusions:   methocarbamol (ROBAXIN) IV      Scheduled Meds:  allopurinol  100 mg Oral QHS   allopurinol  300 mg Oral q AM   aspirin  81 mg Oral BID   carvedilol  12.5 mg Oral BID   docusate sodium  100 mg Oral BID   feeding supplement  237 mL Oral BID BM   furosemide  40 mg Oral Daily   levothyroxine  100 mcg Oral Q0600   multivitamin with minerals  1 tablet Oral Daily   mupirocin ointment  1 Application Nasal BID   polyethylene glycol  17 g Oral BID    PRN meds: acetaminophen, bisacodyl, diphenhydrAMINE, HYDROcodone-acetaminophen, HYDROcodone-acetaminophen, menthol-cetylpyridinium **OR** phenol, methocarbamol **OR** methocarbamol (ROBAXIN) IV, metoCLOPramide **OR** metoCLOPramide (REGLAN) injection, morphine injection, ondansetron **OR** ondansetron (ZOFRAN) IV, pantoprazole   Skin assessment:     Nutritional status:  There is no height or weight on file to calculate BMI.  Nutrition Problem: Increased nutrient needs Etiology: post-op healing, hip fracture Signs/Symptoms: estimated needs     Diet:  Diet Order             Diet regular Room service appropriate? Yes; Fluid consistency: Thin  Diet effective now                   DVT prophylaxis:  SCDs Start: 01/04/22 1420 Place TED hose Start: 01/04/22 1420 SCDs Start: 01/03/22 1611  Antimicrobials: None Fluid: Currently on NS at 75 mill per hour.  Stopped Consultants: Orthopedics Family Communication: Son at bedside  Status is: Inpatient  Continue in-hospital care because: POD 1.  Recovering postop Level of care: Med-Surg   Dispo: The patient is from: Home              Anticipated d/c is to: Home tomorrow per orthopedics              Patient  currently is not medically stable to d/c.   Difficult to place patient No    Antimicrobials: Anti-infectives (From admission, onward)    Start     Dose/Rate Route Frequency Ordered Stop   01/04/22 1800  ceFAZolin (ANCEF) IVPB 2g/100 mL premix        2 g 200 mL/hr over 30 Minutes Intravenous Every 6 hours 01/04/22 1419 01/04/22 2350   01/04/22 1100  ceFAZolin (ANCEF) IVPB 2g/100 mL premix        2 g 200 mL/hr over 30 Minutes Intravenous On call to O.R. 01/04/22 1047 01/04/22 1205   01/04/22 1025  ceFAZolin (ANCEF) 2-4 GM/100ML-% IVPB       Note to Pharmacy: Hassie Bruce A: cabinet override      01/04/22 1025 01/04/22 1214   01/04/22 0915  ceFAZolin (ANCEF) IVPB 2g/100 mL premix  Status:  Discontinued        2 g 200 mL/hr over 30 Minutes Intravenous On call to O.R. 01/04/22 0827 01/04/22 0833       Objective: Vitals:   01/05/22 0944 01/05/22 1229  BP: 120/69 128/80  Pulse: 92 98  Resp: 16 16  Temp: 98 F (36.7 C) 97.9 F (36.6 C)  SpO2: 95% 95%    Intake/Output Summary (Last 24 hours) at 01/05/2022 1330 Last data filed at 01/05/2022 0900 Gross per 24 hour  Intake 1965.09 ml  Output 2000 ml  Net -34.91 ml   There were no vitals filed for this visit. Weight change:  There is no height or weight on file to calculate BMI.   Physical Exam: General exam: Pleasant, elderly Caucasian male.  Not in physical distress Skin: No rashes, lesions or ulcers. HEENT: Atraumatic, normocephalic, no obvious bleeding Lungs: Clear to auscultation bilaterally CVS: Regular rate and rhythm, no murmur GI/Abd soft, nontender, nondistended, bowel sound present CNS: Alert, awake, oriented x 3 Psychiatry: Mood appropriate Extremities: No pedal edema, no calf tenderness  Data Review: I have personally reviewed the laboratory data and studies available.  F/u labs ordered Unresulted Labs (From admission, onward)     Start     Ordered   01/06/22 0263  Basic metabolic panel  Tomorrow  morning,   R        01/05/22 1330   01/05/22 0500  CBC  Daily,   R      01/04/22 1419            Signed, Terrilee Croak, MD [facility] 01/05/2022

## 2022-01-06 DIAGNOSIS — S72001A Fracture of unspecified part of neck of right femur, initial encounter for closed fracture: Secondary | ICD-10-CM | POA: Diagnosis not present

## 2022-01-06 LAB — BASIC METABOLIC PANEL
Anion gap: 7 (ref 5–15)
BUN: 32 mg/dL — ABNORMAL HIGH (ref 8–23)
CO2: 23 mmol/L (ref 22–32)
Calcium: 8.4 mg/dL — ABNORMAL LOW (ref 8.9–10.3)
Chloride: 106 mmol/L (ref 98–111)
Creatinine, Ser: 0.96 mg/dL (ref 0.61–1.24)
GFR, Estimated: >60 mL/min (ref >60)
Glucose, Bld: 169 mg/dL — ABNORMAL HIGH (ref 70–99)
Potassium: 4 mmol/L (ref 3.5–5.1)
Sodium: 136 mmol/L (ref 135–145)

## 2022-01-06 LAB — CBC
HCT: 32.2 % — ABNORMAL LOW (ref 39.0–52.0)
Hemoglobin: 11.2 g/dL — ABNORMAL LOW (ref 13.0–17.0)
MCH: 33.5 pg (ref 26.0–34.0)
MCHC: 34.8 g/dL (ref 30.0–36.0)
MCV: 96.4 fL (ref 80.0–100.0)
Platelets: 129 10*3/uL — ABNORMAL LOW (ref 150–400)
RBC: 3.34 MIL/uL — ABNORMAL LOW (ref 4.22–5.81)
RDW: 13.5 % (ref 11.5–15.5)
WBC: 15.9 10*3/uL — ABNORMAL HIGH (ref 4.0–10.5)
nRBC: 0 % (ref 0.0–0.2)

## 2022-01-06 MED ORDER — METHOCARBAMOL 500 MG PO TABS: 500.0000 mg | ORAL_TABLET | Freq: Four times a day (QID) | ORAL | 1 refills | Status: DC | PRN

## 2022-01-06 MED ORDER — ASPIRIN 81 MG PO CHEW: 81.0000 mg | CHEWABLE_TABLET | Freq: Two times a day (BID) | ORAL | 0 refills | Status: AC

## 2022-01-06 MED ORDER — HYDROCODONE-ACETAMINOPHEN 5-325 MG PO TABS: 1.0000 | ORAL_TABLET | Freq: Four times a day (QID) | ORAL | 0 refills | Status: DC | PRN

## 2022-01-06 MED ORDER — POLYETHYLENE GLYCOL 3350 17 G PO PACK: 17.0000 g | PACK | Freq: Two times a day (BID) | ORAL | 0 refills | Status: DC

## 2022-01-06 NOTE — Progress Notes (Signed)
Reviewed written D/C instructions with patient and all questions answered. Pt verbalized understanding. Pt left with all belongings and in stable condition.

## 2022-01-06 NOTE — Care Management Important Message (Signed)
Important Message  Patient Details IM Letter given. Name: MATTIX IMHOF MRN: [id number] Date of Birth: 1940-11-22   Medicare Important Message Given:  Yes     Kerin Salen 01/06/2022, 9:24 AM

## 2022-01-06 NOTE — Anesthesia Postprocedure Evaluation (Signed)
Anesthesia Post Note  Patient: Jacob Weber  Procedure(s) Performed: TOTAL HIP ARTHROPLASTY ANTERIOR APPROACH (Right: Hip)     Patient location during evaluation: PACU Anesthesia Type: General Level of consciousness: awake and alert Pain management: pain level controlled Vital Signs Assessment: post-procedure vital signs reviewed and stable Respiratory status: spontaneous breathing, nonlabored ventilation, respiratory function stable and patient connected to nasal cannula oxygen Cardiovascular status: blood pressure returned to baseline and stable Postop Assessment: no apparent nausea or vomiting Anesthetic complications: no   No notable events documented.  Last Vitals:  Vitals:   01/05/22 2030 01/06/22 0550  BP: 121/76 125/82  Pulse: 76 90  Resp: 18 18  Temp: 36.6 C 36.5 C  SpO2: 96% 96%    Last Pain:  Vitals:   01/06/22 0550  TempSrc: Oral  PainSc:                  Fraser S

## 2022-01-06 NOTE — Progress Notes (Signed)
Patient ID: Jacob Weber, male   DOB: [birth year], 81 y.o.   MRN: [id number] Subjective: 2 Days Post-Op Procedure(s) (LRB): TOTAL HIP ARTHROPLASTY ANTERIOR APPROACH (Right)    Patient reports pain as mild. Doing well. Did well with therapy yesterday  Objective:   VITALS:   Vitals:   01/05/22 2030 01/06/22 0550  BP: 121/76 125/82  Pulse: 76 90  Resp: 18 18  Temp: 97.8 F (36.6 C) 97.7 F (36.5 C)  SpO2: 96% 96%    Neurovascular intact Incision: dressing C/D/I  LABS Recent Labs    01/04/22 0422 01/05/22 0410 01/06/22 0419  HGB 14.6 12.3* 11.2*  HCT 41.9 35.6* 32.2*  WBC 9.5 15.1* 15.9*  PLT 152 130* 129*    Recent Labs    01/04/22 0422 01/05/22 0410 01/06/22 0419  NA 136 133* 136  K 3.5 3.8 4.0  BUN 20 26* 32*  CREATININE 1.05 1.21 0.96  GLUCOSE 142* 174* 169*    Recent Labs    01/03/22 1942  INR 1.1     Assessment/Plan: 2 Days Post-Op Procedure(s) (LRB): TOTAL HIP ARTHROPLASTY ANTERIOR APPROACH (Right)   Up with therapy Likely home today after stairs with therapy RTC in 2 weeks

## 2022-01-06 NOTE — Progress Notes (Signed)
Physical Therapy Treatment Patient Details Name: Jacob Weber MRN: [id number] DOB: 1941/01/11 Today's Date: 01/06/2022   History of Present Illness 81 y.o. 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 Dx of R femoral neck fx, s/p R AA-THA 01/04/22.    PT Comments    Pt has met PT goals and is ready to DC home from a PT standpoint. He ambulated 250' with RW, completed stair training, and demonstrates good understanding of HEP.   Recommendations for follow up therapy are one component of a multi-disciplinary discharge planning process, led by the attending physician.  Recommendations may be updated based on patient status, additional functional criteria and insurance authorization.  Follow Up Recommendations  No PT follow up     Assistance Recommended at Discharge Set up Supervision/Assistance  Patient can return home with the following A little help with bathing/dressing/bathroom;Assistance with cooking/housework;Assist for transportation;Help with stairs or ramp for entrance   Equipment Recommendations  Rolling walker (2 wheels);BSC/3in1    Recommendations for Other Services       Precautions / Restrictions Precautions Precautions: Fall Precaution Comments: no other falls in past 6 months Restrictions Weight Bearing Restrictions: No Other Position/Activity Restrictions: WBAT     Mobility  Bed Mobility Overal bed mobility: Modified Independent             General bed mobility comments: HOB up, used rail    Transfers Overall transfer level: Needs assistance Equipment used: Rolling walker (2 wheels) Transfers: Sit to/from Stand Sit to Stand: From elevated surface, Min guard           General transfer comment: VCs hand placement    Ambulation/Gait Ambulation/Gait  assistance: Supervision Gait Distance (Feet): 250 Feet Assistive device: Rolling walker (2 wheels) Gait Pattern/deviations: Step-to pattern, Decreased step length - left, Decreased step length - right, Trunk flexed Gait velocity: decr     General Gait Details: VCs sequencing and to lift head, no loss of balance   Stairs Stairs: Yes Stairs assistance: Min assist Stair Management: Step to pattern, Backwards, Two rails Number of Stairs: 1 General stair comments: VCs for sequencing, wife/daughter present, min A to stabilize RW   Wheelchair Mobility    Modified Rankin (Stroke Patients Only)       Balance Overall balance assessment: Modified Independent                                          Cognition Arousal/Alertness: Awake/alert Behavior During Therapy: WFL for tasks assessed/performed Overall Cognitive Status: Within Functional Limits for tasks assessed                                          Exercises Total Joint Exercises Ankle Circles/Pumps: AROM, Both, 10 reps, Supine Quad Sets: AROM, Both, 5 reps, Supine Short Arc Quad: AROM, Right, 10 reps, Supine Heel Slides: AAROM, Right, 10 reps, Supine Hip ABduction/ADduction: AAROM, Right, 5 reps, Supine Long Arc Quad: AROM, Right, Seated, 10 reps    General Comments        Pertinent Vitals/Pain Pain Assessment Pain Score: 0-No pain Pain Location: R hip Pain Descriptors / Indicators: Tightness  Pain Intervention(s): Limited activity within patient's tolerance, Monitored during session, Repositioned    Home Living                          Prior Function            PT Goals (current goals can now be found in the care plan section) Acute Rehab PT Goals Patient 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, gardening PT Goal Formulation: With patient/family Time For Goal Achievement: 01/12/22 Potential to Achieve Goals: Good Progress towards PT goals: Goals  met/education completed, patient discharged from PT    Frequency    Min 6X/week      PT Plan Current plan remains appropriate    Co-evaluation              AM-PAC PT "6 Clicks" Mobility   Outcome Measure  Help needed turning from your back to your side while in a flat bed without using bedrails?: None Help needed moving from lying on your back to sitting on the side of a flat bed without using bedrails?: A Little Help needed moving to and from a bed to a chair (including a wheelchair)?: None Help needed standing up from a chair using your arms (e.g., wheelchair or bedside chair)?: None Help needed to walk in hospital room?: None Help needed climbing 3-5 steps with a railing? : A Little 6 Click Score: 22    End of Session Equipment Utilized During Treatment: Gait belt Activity Tolerance: Patient tolerated treatment well Patient left: with call bell/phone within reach;in chair;with family/visitor present Nurse Communication: Mobility status PT Visit Diagnosis: Difficulty in walking, not elsewhere classified (R26.2);History of falling (Z91.81) Pain - Right/Left: Right Pain - part of body: Hip     Time: 6226-3335 PT Time Calculation (min) (ACUTE ONLY): 28 min  Charges:  $Gait Training: 8-22 mins $Therapeutic Exercise: 8-22 mins                     Blondell Reveal Kistler PT 01/06/2022  Acute Rehabilitation Services  Office [phone]

## 2022-01-06 NOTE — Discharge Summary (Signed)
Physician Discharge Summary  Jacob Weber QMG:[id number] DOB: 11-Nov-1940 DOA: 01/03/2022  PCP: Algis Greenhouse, MD  Admit date: 01/03/2022 Discharge date: 01/06/2022  Admitted From: home Discharge disposition: Home  Recommendations at discharge:  Pain medicines as needed For the next 4 weeks he will be on aspirin 81 mg twice daily after which he will will switch back to aspirin 81 mg daily that he was taking before.  Brief narrative: Jacob Weber is a 81 y.o. male with PMH significant HTN, HLD, CAD/CABG, chronic combined systolic and diastolic CHF, AAA, COPD, hypothyroidism, essential tremor, GERD, trigeminal neuralgia and neck mass. 12//9, patient was brought in from home after a fall.  He apparently tripped over an extension cord, fell and immediately started hurting on the right hip  In the ED, hemodynamically stable Imaging showed an acute nondisplaced, noncommitted right femoral neck fracture with mild varus angulation. 12/10, underwent right total hip replacement  Subjective: Patient was seen and examined this morning.   Propped up in bed.  Not in distress per no new symptoms.  Ready to go home today.  Hospital course: Closed right hip fracture  Secondary to mechanical fall 10/10, underwent right total hip replacement Pain control per orthopedics.  Currently on Norco Noted DVT prophylaxis with aspirin 81 mg twice daily for next 4 weeks after which he will switch back to aspirin 81 mg daily that he was taking before  Chronic, systolic and diastolic CHF Essential hypertension Euvolemic Last echo 2021 with normal EF, grade 1 diastolic dysfunction Continue Coreg, Lasix.  Stop IV fluid  CAD/CABG AAA, HLD No chest pain or shortness of breath Continue medical management with coreg.  Plan of aspirin as above. Allergy to statin.  Declined nonstatin medicine. 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 Has appointment with sarcoma surgeon at [facility] next week   Acquired hypothyroidism Continue synthroid 150mg daily    Acute idiopathic gout of left foot No current flair, continue allopurinol    GERD Continue protonix prn   Wounds:  - Incision (Closed) 01/04/22 Hip (Active)  Date First Assessed/Time First Assessed: 01/04/22 1230   Location: Hip    Assessments 01/04/2022  1:22 PM 01/06/2022  9:00 AM  Dressing Type Other (Comment) Silver hydrofiber  Dressing Clean, Dry, Intact Clean, Dry, Intact  Site / Wound Assessment Dressing in place / Unable to assess Dressing in place / Unable to assess  Drainage Amount None None     No associated orders.    Discharge Exam:   Vitals:   01/05/22 0944 01/05/22 1229 01/05/22 2030 01/06/22 0550  BP: 120/69 128/80 121/76 125/82  Pulse: 92 98 76 90  Resp: '16 16 18 18  '$ Temp: 98 F (36.7 C) 97.9 F (36.6 C) 97.8 F (36.6 C) 97.7 F (36.5 C)  TempSrc:  Oral Oral Oral  SpO2: 95% 95% 96% 96%    There is no height or weight on file to calculate BMI.  General exam: Pleasant, elderly Caucasian male.  Not in physical distress. Skin: No rashes, lesions or ulcers. HEENT: Atraumatic, normocephalic, no obvious bleeding Lungs: Clear to auscultation bilaterally CVS: Regular rate and rhythm, no murmur GI/Abd soft, nontender, nondistended, bowel sound present CNS: Alert, awake, oriented x 3 Psychiatry: Mood appropriate Extremities: No pedal edema, no calf tenderness  Follow ups:    Follow-up Information  Paralee Cancel, MD. Schedule an appointment as soon as possible for a visit in 2 week(s).   Specialty: Orthopedic Surgery Contact information: [address] [place] [address] [phone]         Algis Greenhouse, MD Follow up.   Specialty: Family Medicine Contact information: [address] [place]  [place] [address] [phone]                 Discharge Instructions:   Discharge Instructions     Call MD for:  difficulty breathing, headache or visual disturbances   Complete by: As directed    Call MD for:  extreme fatigue   Complete by: As directed    Call MD for:  hives   Complete by: As directed    Call MD for:  persistant dizziness or light-headedness   Complete by: As directed    Call MD for:  persistant nausea and vomiting   Complete by: As directed    Call MD for:  severe uncontrolled pain   Complete by: As directed    Call MD for:  temperature >100.4   Complete by: As directed    Diet general   Complete by: As directed    Discharge instructions   Complete by: As directed    Recommendations at discharge:   Pain medicines as needed  For the next 4 weeks he will be on aspirin 81 mg twice daily after which he will will switch back to aspirin 81 mg daily that he was taking before.  General discharge instructions: Follow with Primary MD Garlon Hatchet Jaymes Graff, MD in 7 days  Please request your PCP  to go over your hospital tests, procedures, radiology results at the follow up. Please get your medicines reviewed and adjusted.  Your PCP may decide to repeat certain labs or tests as needed. Do not drive, operate heavy machinery, perform activities at heights, swimming or participation in water activities or provide baby sitting services if your were admitted for syncope or siezures until you have seen by Primary MD or a Neurologist and advised to do so again. [place] Controlled Substance Reporting System database was reviewed. Do not drive, operate heavy machinery, perform activities at heights, swim, participate in water activities or provide baby-sitting services while on medications for pain, sleep and mood until your outpatient physician has reevaluated you and advised to do so again.  You are strongly recommended to comply with the dose, frequency and duration of  prescribed medications. Activity: As tolerated with Full fall precautions use walker/cane & assistance as needed Avoid using any recreational substances like cigarette, tobacco, alcohol, or non-prescribed drug. If you experience worsening of your admission symptoms, develop shortness of breath, life threatening emergency, suicidal or homicidal thoughts you must seek medical attention immediately by calling 911 or calling your MD immediately  if symptoms less severe. You must read complete instructions/literature along with all the possible adverse reactions/side effects for all the medicines you take and that have been prescribed to you. Take any new medicine only after you have completely understood and accepted all the possible adverse reactions/side effects.  Wear Seat belts while driving. You were cared for by a hospitalist during your hospital stay. If you have any questions about your discharge medications or the care you received while you were in the hospital after you are discharged, you can call the unit and ask to speak with the hospitalist or the covering physician. Once you are discharged, your primary care physician  will handle any further medical issues. Please note that NO REFILLS for any discharge medications will be authorized once you are discharged, as it is imperative that you return to your primary care physician (or establish a relationship with a primary care physician if you do not have one).   Discharge wound care:   Complete by: As directed    Increase activity slowly   Complete by: As directed        Discharge Medications:   Allergies as of 01/06/2022       Reactions   Ace Inhibitors Other (See Comments)   Muscle aches, cough   Rosuvastatin    Other reaction(s): Muscle pain   Statins Other (See Comments)   Felt bad        Medication List     STOP taking these medications    aspirin EC 81 MG tablet Replaced by: aspirin 81 MG chewable tablet        TAKE these medications    allopurinol 100 MG tablet Commonly known as: ZYLOPRIM Take 100 mg by mouth at bedtime.   allopurinol 300 MG tablet Commonly known as: ZYLOPRIM Take 300 mg by mouth in the morning.   aspirin 81 MG chewable tablet Chew 1 tablet (81 mg total) by mouth 2 (two) times daily for 28 days. Replaces: aspirin EC 81 MG tablet   carvedilol 25 MG tablet Commonly known as: COREG Take 0.5 tablets (12.5 mg total) by mouth 2 (two) times daily.   colchicine 0.6 MG tablet Take 0.6 mg by mouth 2 (two) times daily as needed. Gout   dicyclomine 10 MG capsule Commonly known as: BENTYL take 1 capsule by mouth four times a day if needed for INTESTINAL SPASMS   furosemide 40 MG tablet Commonly known as: LASIX TAKE 1 TABLET BY MOUTH ONCE (1) DAILY 1   HYDROcodone-acetaminophen 5-325 MG tablet Commonly known as: NORCO/VICODIN Take 1 tablet by mouth every 6 (six) hours as needed for severe pain.   levothyroxine 100 MCG tablet Commonly known as: SYNTHROID Take 100 mcg by mouth daily.   methocarbamol 500 MG tablet Commonly known as: ROBAXIN Take 1 tablet (500 mg total) by mouth every 6 (six) hours as needed for muscle spasms (muscle pain).   nitroGLYCERIN 0.4 MG SL tablet Commonly known as: NITROSTAT Place 0.4 mg under the tongue every 5 (five) minutes as needed for chest pain.   pantoprazole 40 MG tablet Commonly known as: PROTONIX Take 40 mg by mouth daily as needed (acid reflux).   polyethylene glycol 17 g packet Commonly known as: MIRALAX / GLYCOLAX Take 17 g by mouth 2 (two) times daily.   potassium chloride SA 20 MEQ tablet Commonly known as: KLOR-CON M TAKE 1 TABLET BY MOUTH ONCE (1) DAILY   Travatan Z 0.004 % Soln ophthalmic solution Generic drug: Travoprost (BAK Free) Place 1 drop into both eyes at bedtime.   VITAMIN B 12 PO Take 1 tablet by mouth daily.               Durable Medical Equipment  (From admission, onward)            Start     Ordered   01/05/22 1533  For home use only DME 3 n 1  Once        01/05/22 1532   01/05/22 1127  For home use only DME Walker rolling  Once       Question Answer Comment  Walker: With [place]  Patient needs a walker to treat with the following condition Difficulty in walking, not elsewhere classified      01/05/22 1126              Discharge Care Instructions  (From admission, onward)           Start     Ordered   01/06/22 0000  Discharge wound care:        01/06/22 1206             The results of significant diagnostics from this hospitalization (including imaging, microbiology, ancillary and laboratory) are listed below for reference.    Procedures and Diagnostic Studies:   DG Pelvis Portable  Result Date: 01/04/2022 CLINICAL DATA:  Follow-up total hip arthroplasty EXAM: PORTABLE PELVIS 1-2 VIEWS COMPARISON:  Earlier same day FINDINGS: Total hip arthroplasty on the right. Components appear well positioned. No radiographically detectable complication. IMPRESSION: Good appearance following total hip arthroplasty on the right. Electronically Signed   By: Nelson Chimes M.D.   On: 01/04/2022 14:57   DG HIP UNILAT WITH PELVIS 1V RIGHT  Result Date: 01/04/2022 CLINICAL DATA:  Intraoperative fluoroscopy, right hip arthroplasty EXAM: DG HIP (WITH OR WITHOUT PELVIS) 1V RIGHT; DG C-ARM 1-60 MIN-NO REPORT COMPARISON:  01/03/2022 FLUOROSCOPY: Air kerma 1.48 mGy FINDINGS: Intraoperative fluoroscopic images of the right hip demonstrate partially completed right hip total arthroplasty. No perihardware fracture or component malpositioning. IMPRESSION: Intraoperative fluoroscopic images of the right hip demonstrate partially completed right hip total arthroplasty. No perihardware fracture or component malpositioning. Electronically Signed   By: Delanna Ahmadi M.D.   On: 01/04/2022 13:17   DG C-Arm 1-60 Min-No Report  Result Date: 01/04/2022 Fluoroscopy was utilized  by the requesting physician.  No radiographic interpretation.   Chest Portable 1 View  Result Date: 01/03/2022 CLINICAL DATA:  Preop for hip surgery. EXAM: PORTABLE CHEST 1 VIEW COMPARISON:  08/12/2020. FINDINGS: Stable changes from prior CABG surgery. Cardiac silhouette is normal in size. No mediastinal or hilar masses. Mild linear scarring at the left lung base. Lungs otherwise clear. No convincing pleural effusion and no pneumothorax. Stable partly imaged reverse right shoulder prosthesis. IMPRESSION: No acute cardiopulmonary disease. Electronically Signed   By: Lajean Manes M.D.   On: 01/03/2022 16:41   DG Hip Unilat W or Wo Pelvis 2-3 Views Right  Result Date: 01/03/2022 CLINICAL DATA:  Fall.  Right hip pain. EXAM: DG HIP (WITH OR WITHOUT PELVIS) 2-3V RIGHT COMPARISON:  None Available. FINDINGS: Nondisplaced, non comminuted fracture of the mid right femoral neck with mild varus angulation. No other fractures.  No bone lesions. Hip joints, SI joints and pubic symphysis are normally spaced and aligned. Bilateral iliac and femoral artery vascular calcifications. Soft tissues otherwise unremarkable. IMPRESSION: 1. Nondisplaced and non comminuted right femoral neck fracture with mild varus angulation. Electronically Signed   By: Lajean Manes M.D.   On: 01/03/2022 13:24     Labs:   Basic Metabolic Panel: Recent Labs  Lab 01/03/22 1247 01/04/22 0422 01/05/22 0410 01/06/22 0419  NA 138 136 133* 136  K 3.9 3.5 3.8 4.0  CL 102 102 101 106  CO2 '26 25 22 23  '$ GLUCOSE 129* 142* 174* 169*  BUN 19 20 26* 32*  CREATININE 1.15 1.05 1.21 0.96  CALCIUM 9.0 9.2 8.6* 8.4*   GFR CrCl cannot be calculated (Unknown ideal weight.). Liver Function Tests: No results for input(s): "AST", "ALT", "ALKPHOS", "BILITOT", "PROT", "ALBUMIN" in the last 168 hours. No results for input(s): "  LIPASE", "AMYLASE" in the last 168 hours. No results for input(s): "AMMONIA" in the last 168 hours. Coagulation  profile Recent Labs  Lab 01/03/22 1942  INR 1.1    CBC: Recent Labs  Lab 01/03/22 1247 01/04/22 0422 01/05/22 0410 01/06/22 0419  WBC 8.0 9.5 15.1* 15.9*  HGB 14.2 14.6 12.3* 11.2*  HCT 40.9 41.9 35.6* 32.2*  MCV 98.1 98.1 97.0 96.4  PLT 151 152 130* 129*   Cardiac Enzymes: No results for input(s): "CKTOTAL", "CKMB", "CKMBINDEX", "TROPONINI" in the last 168 hours. BNP: Invalid input(s): "POCBNP" CBG: No results for input(s): "GLUCAP" in the last 168 hours. D-Dimer No results for input(s): "DDIMER" in the last 72 hours. Hgb A1c No results for input(s): "HGBA1C" in the last 72 hours. Lipid Profile No results for input(s): "CHOL", "HDL", "LDLCALC", "TRIG", "CHOLHDL", "LDLDIRECT" in the last 72 hours. Thyroid function studies Recent Labs    01/03/22 1942  TSH 5.277*   Anemia work up No results for input(s): "VITAMINB12", "FOLATE", "FERRITIN", "TIBC", "IRON", "RETICCTPCT" in the last 72 hours. Microbiology Recent Results (from the past 240 hour(s))  Surgical PCR screen     Status: Abnormal   Collection Time: 01/03/22 10:46 PM   Specimen: Nasal Mucosa; Nasal Swab  Result Value Ref Range Status   MRSA, PCR NEGATIVE NEGATIVE Final   Staphylococcus aureus POSITIVE (A) NEGATIVE Final    Comment: (NOTE) The Xpert SA Assay (FDA approved for NASAL specimens in patients 10 years of age and older), is one component of a comprehensive surveillance program. It is not intended to diagnose infection nor to guide or monitor treatment. Performed at [facility], [place] [address] [place], [place] [address]     Time coordinating discharge: 35 minutes  Signed: Marlowe Aschoff Melik Blancett  [facility] 01/06/2022, 4:40 PM

## 2022-01-08 ENCOUNTER — Encounter (HOSPITAL_COMMUNITY): Payer: Self-pay | Admitting: Orthopedic Surgery

## 2022-02-11 DIAGNOSIS — D492 Neoplasm of unspecified behavior of bone, soft tissue, and skin: Secondary | ICD-10-CM

## 2022-02-11 HISTORY — DX: Neoplasm of unspecified behavior of bone, soft tissue, and skin: D49.2

## 2022-03-26 NOTE — Progress Notes (Deleted)
Cardiology Office Note:    Date:  03/27/2022   ID:  Jacob Weber, DOB November 09, 1940, MRN [id number]  PCP:  Jacob Greenhouse, MD  Cardiologist:  Jacob More, MD    Referring MD: Jacob Greenhouse, MD    ASSESSMENT:    1. Coronary artery disease involving native coronary artery of native heart with angina pectoris ([place])   2. Hypertensive heart disease with heart failure ([place])   3. Mixed hyperlipidemia   4. Statin intolerance   5. Ascending aorta enlargement (HCC)    PLAN:    In order of problems listed above:  Jacob Weber continues to do well she has mild CAD no angina continue medical therapy including aspirin beta-blocker and currently not on statin BP at target continue current antihypertensive diuretic beta-blocker   Next appointment: 6 months   Medication Adjustments/Labs and Tests Ordered: Current medicines are reviewed at length with the patient today.  Concerns regarding medicines are outlined above.  No orders of the defined types were placed in this encounter.  No orders of the defined types were placed in this encounter.  Chief complaint follow-up atrial fibrillation   History of Present Illness:    Jacob Weber is a 82 y.o. male with a hx of CAD with remote CABG 1997 heart failure with ejection fraction low normal 50 to 55% dyslipidemia statin intolerance hypertensive heart disease with heart failure COPD lung nodule and moderately enlarged ascending aorta 43 mmHg last seen 09/25/2021. Compliance with diet, lifestyle and medications: Yes  In the fall she had a respiratory illness and afterwards he was having trouble with chronic cough and reflux and a thyroid nodule.  She took a month of PPI and her blood pressure went up?  Due to absorption now back to target Fortunately no episodes of atrial fibrillation palpitations syncope edema or chest pain  No side effects from flecainide Past Medical History:  Diagnosis Date   Acquired hypothyroidism 03/13/2015   Acute  bronchitis due to other specified organisms 04/22/2016   Angina pectoris ([place]) 03/29/2016   Ascending aortic aneurysm ([place]) 06/11/2016   Overview:  2018: CT 4.2 cm   Cardiomyopathy, secondary ([place]) 12/04/2014   Chronic combined systolic and diastolic heart failure ([place]) 12/04/2014   Chronic coronary artery disease 12/04/2014   Overview:  1. MPS 08/11/2010 with EF 56% and mild apical and septal iscjhemia 2. Cath 03/17/2011 with CTO of LAD and RCA, LTA is patent to D, SVG is patent to the RCA, SVG to LAD is chronically occluded, EF 58%   Chronic cough 06/23/2016   Overview:  Onset 2015.  CT chest mild changes.    Last Assessment & Plan:  Here for evaluation of his upper airway from pulmonology due to chronic cough. Recently had his PPI increased to twice a day and was started on an inhaler.  He has noticed improvement in his cough, but it still bothersome.  No ACE inhibitor.  Cough seems to be much worse in the spring. EXAM by indirect laryngoscopy shows normal true vocal cords move well on phonation.  3+ edema of the arytenoid. PLAN: Reassured his larynx looks okay.  Mild arytenoid edema suggestive of reflux-induced irritation.  I agree with everything that has been done.  Let us see how he does with his new inhaler twice a day PPI therapy for the month of June.  If he still persists with a bothersome cough, he will call to set up allergy testing.  Call sooner if symptoms are worsening.  Coronary artery disease    Disease of thyroid gland 10/15/2016   Emphysema of lung ([place]) 06/11/2016   Overview:  2018: mild changes on CT, remote cigs   Esophagitis, erosive 03/29/2016   Overview:  (2012) DX/RX: EGD, CHRONIC PPI NO GI RECHECK;   Essential tremor 03/29/2016   GERD (gastroesophageal reflux disease)    Hyperlipidemia 12/04/2014   Hypertension    Hypertensive heart disease with heart failure ([place]) 12/04/2014   Irritable bowel syndrome without diarrhea 02/28/2015   MI (myocardial infarction) ([place]) 10/15/2016   Mixed  hyperlipidemia 12/04/2014   Pneumonitis 06/10/2016   Overview:  2018   Preventative health care 04/11/2015   Screening for prostate cancer 04/12/2015   Overview:  2017: DRE declined   Secondary cardiomyopathy ([place]) 12/04/2014   Solitary pulmonary nodule 06/11/2016   Overview:  09/10/16: CT 9 mm LLL, stable on f/u - recheck February 2019   Thyroid disease    Trigeminal neuralgia 03/29/2016    Past Surgical History:  Procedure Laterality Date   CHOLECYSTECTOMY     CORONARY ANGIOPLASTY WITH STENT PLACEMENT     CORONARY ARTERY BYPASS GRAFT  x 5   TOTAL HIP ARTHROPLASTY Right 01/04/2022   Procedure: TOTAL HIP ARTHROPLASTY ANTERIOR APPROACH;  Surgeon: Paralee Cancel, MD;  Location: [facility];  Service: Orthopedics;  Laterality: Right;   TOTAL KNEE ARTHROPLASTY Left    TOTAL SHOULDER REPLACEMENT Right     Current Medications: Current Meds  Medication Sig   allopurinol (ZYLOPRIM) 100 MG tablet Take 100 mg by mouth at bedtime.   allopurinol (ZYLOPRIM) 300 MG tablet Take 300 mg by mouth in the morning.   aspirin 81 MG chewable tablet Chew 81 mg by mouth daily.   bimatoprost (LUMIGAN) 0.03 % ophthalmic solution Place 1 drop into both eyes at bedtime.   carvedilol (COREG) 25 MG tablet Take 0.5 tablets (12.5 mg total) by mouth 2 (two) times daily.   colchicine 0.6 MG tablet Take 0.6 mg by mouth 2 (two) times daily as needed. Gout   Cyanocobalamin (VITAMIN B 12 PO) Take 1 tablet by mouth daily.   dicyclomine (BENTYL) 10 MG capsule take 1 capsule by mouth four times a day if needed for INTESTINAL SPASMS   furosemide (LASIX) 40 MG tablet TAKE 1 TABLET BY MOUTH ONCE (1) DAILY 1   levothyroxine (SYNTHROID, LEVOTHROID) 100 MCG tablet Take 100 mcg by mouth daily.   methocarbamol (ROBAXIN) 500 MG tablet Take 1 tablet (500 mg total) by mouth every 6 (six) hours as needed for muscle spasms (muscle pain).   nitroGLYCERIN (NITROSTAT) 0.4 MG SL tablet Place 0.4 mg under the tongue every 5 (five) minutes as needed for  chest pain.   pantoprazole (PROTONIX) 40 MG tablet Take 40 mg by mouth daily as needed (acid reflux).   polyethylene glycol (MIRALAX / GLYCOLAX) 17 g packet Take 17 g by mouth 2 (two) times daily.   potassium chloride SA (KLOR-CON M) 20 MEQ tablet TAKE 1 TABLET BY MOUTH ONCE (1) DAILY     Allergies:   Ace inhibitors, Rosuvastatin, and Statins   Social History   Socioeconomic History   Marital status: Married    Spouse name: Not on file   Number of children: Not on file   Years of education: Not on file   Highest education level: Not on file  Occupational History   Not on file  Tobacco Use   Smoking status: Former    Types: Cigarettes    Quit date: 12/05/1979  Years since quitting: 42.3   Smokeless tobacco: Never  Vaping Use   Vaping Use: Never used  Substance and Sexual Activity   Alcohol use: No   Drug use: No   Sexual activity: Not on file  Other Topics Concern   Not on file  Social History Narrative   Not on file   Social Determinants of Health   Financial Resource Strain: Not on file  Food Insecurity: Unknown (01/03/2022)   Hunger Vital Sign    Worried About Running Out of Food in the Last Year: Patient refused    [place] in the Last Year: Patient refused  Transportation Needs: No Transportation Needs (01/03/2022)   PRAPARE - Hydrologist (Medical): No    Lack of Transportation (Non-Medical): No  Physical Activity: Not on file  Stress: Not on file  Social Connections: Not on file     Family History: The patient's family history includes Asthma in his sister; CAD in his mother; Stroke in his father; Thyroid cancer in his sister. ROS:   Please see the history of present illness.    All other systems reviewed and are negative.  EKGs/Labs/Other Studies Reviewed:    The following studies were reviewed today:  Cardiac Studies & Procedures       ECHOCARDIOGRAM  ECHOCARDIOGRAM COMPLETE  08/11/2019  Narrative ECHOCARDIOGRAM REPORT    Patient Name:   LUX CULLINAN Date of Exam: 08/11/2019 Medical Rec #:  [id number]       Height:       74.5 in Accession #:    [id number]      Weight:       199.0 lb Date of Birth:  [birth year]       BSA:          2.179 m Patient Age:    15 years        BP:           124/70 mmHg Patient Gender: M               HR:           70 bpm. Exam Location:  [facility]  Procedure: 2D Echo and Intracardiac Opacification Agent  Indications:    Chronic combined systolic and diastolic heart failure ([place]) [I50.42 (ICD-10-CM)]  History:        Patient has prior history of Echocardiogram examinations, most recent 12/22/2016. Cardiomegaly, CAD, Prior CABG, Ascending aortic aneurysm, Signs/Symptoms:Hypertensive Heart Disease; Risk Factors:Dyslipidemia.  Sonographer:    Luane School Referring Phys: [phone] Richardo Priest   Sonographer Comments: Suboptimal apical window. IMPRESSIONS   1. Left ventricular ejection fraction, by estimation, is 50 to 55%. The left ventricle has low normal function. The left ventricle has no regional wall motion abnormalities. Left ventricular diastolic parameters are consistent with Grade I diastolic dysfunction (impaired relaxation). 2. Right ventricular systolic function is normal. The right ventricular size is normal. There is normal pulmonary artery systolic pressure. 3. The mitral valve is normal in structure. No evidence of mitral valve regurgitation. No evidence of mitral stenosis. 4. The aortic valve is normal in structure. Aortic valve regurgitation is not visualized. No aortic stenosis is present. 5. There is moderate dilatation of the ascending aorta measuring 43 mm. 6. The inferior vena cava is normal in size with greater than 50% respiratory variability, suggesting right atrial pressure of 3 mmHg.  FINDINGS Left Ventricle: Left ventricular ejection fraction, by estimation, is 50 to 55%. The left  ventricle has low  normal function. The left ventricle has no regional wall motion abnormalities. Definity contrast agent was given IV to delineate the left ventricular endocardial borders. The left ventricular internal cavity size was normal in size. There is no left ventricular hypertrophy. Left ventricular diastolic parameters are consistent with Grade I diastolic dysfunction (impaired relaxation).  Right Ventricle: The right ventricular size is normal. No increase in right ventricular wall thickness. Right ventricular systolic function is normal. There is normal pulmonary artery systolic pressure. The tricuspid regurgitant velocity is 2.09 m/s, and with an assumed right atrial pressure of 3 mmHg, the estimated right ventricular systolic pressure is 123XX123 mmHg.  Left Atrium: Left atrial size was normal in size.  Right Atrium: Right atrial size was normal in size.  Pericardium: There is no evidence of pericardial effusion.  Mitral Valve: The mitral valve is normal in structure. Normal mobility of the mitral valve leaflets. No evidence of mitral valve regurgitation. No evidence of mitral valve stenosis.  Tricuspid Valve: The tricuspid valve is normal in structure. Tricuspid valve regurgitation is mild . No evidence of tricuspid stenosis.  Aortic Valve: The aortic valve is normal in structure. Aortic valve regurgitation is not visualized. No aortic stenosis is present.  Pulmonic Valve: The pulmonic valve was normal in structure. Pulmonic valve regurgitation is not visualized. No evidence of pulmonic stenosis.  Aorta: The aortic root is normal in size and structure. There is moderate dilatation of the ascending aorta measuring 43 mm.  Venous: The inferior vena cava is normal in size with greater than 50% respiratory variability, suggesting right atrial pressure of 3 mmHg.  IAS/Shunts: No atrial level shunt detected by color flow Doppler.   LEFT VENTRICLE PLAX 2D LVIDd:         5.50 cm     Diastology LVIDs:          4.80 cm     LV e' lateral:   6.53 cm/s LV PW:         1.20 cm     LV E/e' lateral: 7.6 LV IVS:        1.20 cm     LV e' medial:    4.90 cm/s LVOT diam:     2.50 cm     LV E/e' medial:  10.1 LV SV:         76 LV SV Index:   35 LVOT Area:     4.91 cm  LV Volumes (MOD) LV vol d, MOD A4C: 64.8 ml LV vol s, MOD A4C: 32.6 ml LV SV MOD A4C:     64.8 ml  RIGHT VENTRICLE            IVC RV S prime:     5.66 cm/s  IVC diam: 1.50 cm TAPSE (M-mode): 1.4 cm  LEFT ATRIUM             Index       RIGHT ATRIUM           Index LA diam:        4.30 cm 1.97 cm/m  RA Area:     16.00 cm LA Vol (A2C):   44.6 ml 20.47 ml/m RA Volume:   43.10 ml  19.78 ml/m LA Vol (A4C):   50.0 ml 22.95 ml/m LA Biplane Vol: 51.0 ml 23.41 ml/m AORTIC VALVE LVOT Vmax:   68.00 cm/s LVOT Vmean:  48.600 cm/s LVOT VTI:    0.155 m  AORTA Ao Root diam: 4.00 cm Ao Asc  diam:  4.30 cm  MITRAL VALVE               TRICUSPID VALVE MV Area (PHT): 2.20 cm    TR Peak grad:   17.5 mmHg MV Decel Time: 345 msec    TR Vmax:        209.00 cm/s MV E velocity: 49.50 cm/s MV A velocity: 80.50 cm/s  SHUNTS MV E/A ratio:  0.61        Systemic VTI:  0.16 m Systemic Diam: 2.50 cm  Jenne Campus MD Electronically signed by Jenne Campus MD Signature Date/Time: 08/11/2019/4:59:55 PM    Final             EKG:  EKG ordered today and personally reviewed.  The ekg ordered today demonstrates sinus rhythm poor R wave progression 10/06/2021 cholesterol 173 LDL 100 and Recent Labs: 01/03/2022: B Natriuretic Peptide 101.7; TSH 5.277 01/06/2022: BUN 32; Creatinine, Ser 0.96; Hemoglobin 11.2; Platelets 129; Potassium 4.0; Sodium 136  Physical Exam:    VS:  BP 120/70 (BP Location: Left Arm, Patient Position: Sitting, Cuff Size: Normal)   Pulse 78   Ht '6\' 2"'$  (1.88 m)   Wt 189 lb (85.7 kg)   SpO2 97%   BMI 24.27 kg/m     Wt Readings from Last 3 Encounters:  03/27/22 189 lb (85.7 kg)  09/25/21 190 lb 12.8 oz (86.5 kg)   03/24/21 190 lb (86.2 kg)     GEN:  Well nourished, well developed in no acute distress HEENT: Normal NECK: No JVD; No carotid bruits LYMPHATICS: No lymphadenopathy CARDIAC: RRR, no murmurs, rubs, gallops RESPIRATORY:  Clear to auscultation without rales, wheezing or rhonchi  ABDOMEN: Soft, non-tender, non-distended MUSCULOSKELETAL:  No edema; No deformity  SKIN: Warm and dry NEUROLOGIC:  Alert and oriented x 3 PSYCHIATRIC:  Normal affect    Signed, Jacob More, MD  03/27/2022 9:37 AM    [place]

## 2022-03-27 ENCOUNTER — Ambulatory Visit: Payer: Medicare Other | Attending: Cardiology | Admitting: Cardiology

## 2022-03-27 ENCOUNTER — Encounter: Payer: Self-pay | Admitting: Cardiology

## 2022-03-27 VITALS — BP 120/70 | HR 78 | Ht 74.0 in | Wt 189.0 lb

## 2022-03-27 DIAGNOSIS — Z789 Other specified health status: Secondary | ICD-10-CM | POA: Diagnosis not present

## 2022-03-27 DIAGNOSIS — H9193 Unspecified hearing loss, bilateral: Secondary | ICD-10-CM | POA: Insufficient documentation

## 2022-03-27 DIAGNOSIS — E782 Mixed hyperlipidemia: Secondary | ICD-10-CM | POA: Diagnosis not present

## 2022-03-27 DIAGNOSIS — I25119 Atherosclerotic heart disease of native coronary artery with unspecified angina pectoris: Secondary | ICD-10-CM | POA: Diagnosis not present

## 2022-03-27 DIAGNOSIS — I110 Hypertensive heart disease with heart failure: Secondary | ICD-10-CM | POA: Diagnosis not present

## 2022-03-27 HISTORY — DX: Unspecified hearing loss, bilateral: H91.93

## 2022-03-27 NOTE — Patient Instructions (Signed)
Medication Instructions:  Your physician recommends that you continue on your current medications as directed. Please refer to the Current Medication list given to you today.  *If you need a refill on your cardiac medications before your next appointment, please call your pharmacy*   Lab Work: None If you have labs (blood work) drawn today and your tests are completely normal, you will receive your results only by: [place] (if you have MyChart) OR A paper copy in the mail If you have any lab test that is abnormal or we need to change your treatment, we will call you to review the results.   Testing/Procedures: None   Follow-Up: At [facility], you and your health needs are our priority.  As part of our continuing mission to provide you with exceptional heart care, we have created designated Provider Care Teams.  These Care Teams include your primary Cardiologist (physician) and Advanced Practice Providers (APPs -  Physician Assistants and Nurse Practitioners) who all work together to provide you with the care you need, when you need it.  We recommend signing up for the patient portal called "MyChart".  Sign up information is provided on this After Visit Summary.  MyChart is used to connect with patients for Virtual Visits (Telemedicine).  Patients are able to view lab/test results, encounter notes, upcoming appointments, etc.  Non-urgent messages can be sent to your provider as well.   To learn more about what you can do with MyChart, go to [email or web address].    Your next appointment:   6 month(s)  Provider:   Shirlee More, MD    Other Instructions None

## 2022-03-27 NOTE — Progress Notes (Signed)
Cardiology Office Note:    Date:  03/27/2022   ID:  Jacob Weber, DOB 16-May-1940, MRN [id number]  PCP:  Algis Greenhouse, MD  Cardiologist:  Shirlee More, MD    Referring MD: Algis Greenhouse, MD    ASSESSMENT:    1. Coronary artery disease involving native coronary artery of native heart with angina pectoris ([place])   2. Hypertensive heart disease with heart failure ([place])   3. Mixed hyperlipidemia   4. Statin intolerance    PLAN:    In order of problems listed above:  Andrue continues to do well with CAD continue his medical therapy including aspirin beta-blocker has been statin intolerant and has declined alternate lipid-lowering therapy Stable no fluid overload continue his current loop diuretic carvedilol   Next appointment: 6 months   Medication Adjustments/Labs and Tests Ordered: Current medicines are reviewed at length with the patient today.  Concerns regarding medicines are outlined above.  No orders of the defined types were placed in this encounter.  No orders of the defined types were placed in this encounter.   Chief Complaint  Patient presents with   Follow-up    History of Present Illness:    Jacob Weber is a 82 y.o. male with a hx of CAD with remote CABG 1997 heart failure with ejection fraction low normal 50 to 55% dyslipidemia statin intolerance hypertensive heart disease with heart failure COPD lung nodule and moderately enlarged ascending aorta 43 mmHg last seen 09/25/2021.  Compliance with diet, lifestyle and medications: Yes  In the interim he has had a fall with hip fracture and did well with ORIF \.   EKG this admission showed sinus rhythm first-degree AV block occasional PVC otherwise normal he is recovered no angina palpitation or syncope Past Medical History:  Diagnosis Date   Acquired hypothyroidism 03/13/2015   Acute bronchitis due to other specified organisms 04/22/2016   Angina pectoris ([place]) 03/29/2016   Ascending aortic aneurysm  ([place]) 06/11/2016   Overview:  2018: CT 4.2 cm   Cardiomyopathy, secondary ([place]) 12/04/2014   Chronic combined systolic and diastolic heart failure ([place]) 12/04/2014   Chronic coronary artery disease 12/04/2014   Overview:  1. MPS 08/11/2010 with EF 56% and mild apical and septal iscjhemia 2. Cath 03/17/2011 with CTO of LAD and RCA, LTA is patent to D, SVG is patent to the RCA, SVG to LAD is chronically occluded, EF 58%   Chronic cough 06/23/2016   Overview:  Onset 2015.  CT chest mild changes.    Last Assessment & Plan:  Here for evaluation of his upper airway from pulmonology due to chronic cough. Recently had his PPI increased to twice a day and was started on an inhaler.  He has noticed improvement in his cough, but it still bothersome.  No ACE inhibitor.  Cough seems to be much worse in the spring. EXAM by indirect laryngoscopy shows normal true vocal cords move well on phonation.  3+ edema of the arytenoid. PLAN: Reassured his larynx looks okay.  Mild arytenoid edema suggestive of reflux-induced irritation.  I agree with everything that has been done.  Let us see how he does with his new inhaler twice a day PPI therapy for the month of June.  If he still persists with a bothersome cough, he will call to set up allergy testing.  Call sooner if symptoms are worsening.   Coronary artery disease    Disease of thyroid gland 10/15/2016   Emphysema of lung ([place])  06/11/2016   Overview:  2018: mild changes on CT, remote cigs   Esophagitis, erosive 03/29/2016   Overview:  (2012) DX/RX: EGD, CHRONIC PPI NO GI RECHECK;   Essential tremor 03/29/2016   GERD (gastroesophageal reflux disease)    Hyperlipidemia 12/04/2014   Hypertension    Hypertensive heart disease with heart failure ([place]) 12/04/2014   Irritable bowel syndrome without diarrhea 02/28/2015   MI (myocardial infarction) ([place]) 10/15/2016   Mixed hyperlipidemia 12/04/2014   Pneumonitis 06/10/2016   Overview:  2018   Preventative health care 04/11/2015    Screening for prostate cancer 04/12/2015   Overview:  2017: DRE declined   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/04/2014   Solitary pulmonary nodule 06/11/2016   Overview:  09/10/16: CT 9 mm LLL, stable on f/u - recheck February 2019   Thyroid disease    Trigeminal neuralgia 03/29/2016    Past Surgical History:  Procedure Laterality Date   CHOLECYSTECTOMY     CORONARY ANGIOPLASTY WITH STENT PLACEMENT     CORONARY ARTERY BYPASS GRAFT  x 5   TOTAL HIP ARTHROPLASTY Right 01/04/2022   Procedure: TOTAL HIP ARTHROPLASTY ANTERIOR APPROACH;  Surgeon: Paralee Cancel, MD;  Location: [facility];  Service: Orthopedics;  Laterality: Right;   TOTAL KNEE ARTHROPLASTY Left    TOTAL SHOULDER REPLACEMENT Right     Current Medications: Current Meds  Medication Sig   allopurinol (ZYLOPRIM) 100 MG tablet Take 100 mg by mouth at bedtime.   allopurinol (ZYLOPRIM) 300 MG tablet Take 300 mg by mouth in the morning.   aspirin 81 MG chewable tablet Chew 81 mg by mouth daily.   bimatoprost (LUMIGAN) 0.03 % ophthalmic solution Place 1 drop into both eyes at bedtime.   carvedilol (COREG) 25 MG tablet Take 0.5 tablets (12.5 mg total) by mouth 2 (two) times daily.   colchicine 0.6 MG tablet Take 0.6 mg by mouth 2 (two) times daily as needed. Gout   Cyanocobalamin (VITAMIN B 12 PO) Take 1 tablet by mouth daily.   dicyclomine (BENTYL) 10 MG capsule take 1 capsule by mouth four times a day if needed for INTESTINAL SPASMS   furosemide (LASIX) 40 MG tablet TAKE 1 TABLET BY MOUTH ONCE (1) DAILY 1   levothyroxine (SYNTHROID, LEVOTHROID) 100 MCG tablet Take 100 mcg by mouth daily.   methocarbamol (ROBAXIN) 500 MG tablet Take 1 tablet (500 mg total) by mouth every 6 (six) hours as needed for muscle spasms (muscle pain).   nitroGLYCERIN (NITROSTAT) 0.4 MG SL tablet Place 0.4 mg under the tongue every 5 (five) minutes as needed for chest pain.   pantoprazole (PROTONIX) 40 MG tablet Take 40 mg by mouth daily as needed (acid reflux).    polyethylene glycol (MIRALAX / GLYCOLAX) 17 g packet Take 17 g by mouth 2 (two) times daily.   potassium chloride SA (KLOR-CON M) 20 MEQ tablet TAKE 1 TABLET BY MOUTH ONCE (1) DAILY     Allergies:   Ace inhibitors, Rosuvastatin, and Statins   Social History   Socioeconomic History   Marital status: Married    Spouse name: Not on file   Number of children: Not on file   Years of education: Not on file   Highest education level: Not on file  Occupational History   Not on file  Tobacco Use   Smoking status: Former    Types: Cigarettes    Quit date: 12/05/1979    Years since quitting: 42.3   Smokeless tobacco: Never  Vaping Use   Vaping Use:  Never used  Substance and Sexual Activity   Alcohol use: No   Drug use: No   Sexual activity: Not on file  Other Topics Concern   Not on file  Social History Narrative   Not on file   Social Determinants of Health   Financial Resource Strain: Not on file  Food Insecurity: Unknown (01/03/2022)   Hunger Vital Sign    Worried About Running Out of Food in the Last Year: Patient refused    [place] in the Last Year: Patient refused  Transportation Needs: No Transportation Needs (01/03/2022)   PRAPARE - Hydrologist (Medical): No    Lack of Transportation (Non-Medical): No  Physical Activity: Not on file  Stress: Not on file  Social Connections: Not on file     Family History: The patient's family history includes Asthma in his sister; CAD in his mother; Stroke in his father; Thyroid cancer in his sister. ROS:   Please see the history of present illness.    All other systems reviewed and are negative.  EKGs/Labs/Other Studies Reviewed:    The following studies were reviewed today:  Cardiac Studies & Procedures       ECHOCARDIOGRAM  ECHOCARDIOGRAM COMPLETE 08/11/2019  Narrative ECHOCARDIOGRAM REPORT    Patient Name:   TARIUS CONARY Date of Exam: 08/11/2019 Medical Rec #:  [id number]        Height:       74.5 in Accession #:    [id number]      Weight:       199.0 lb Date of Birth:  10-17-1940       BSA:          2.179 m Patient Age:    82 years        BP:           124/70 mmHg Patient Gender: M               HR:           70 bpm. Exam Location:  [facility]  Procedure: 2D Echo and Intracardiac Opacification Agent  Indications:    Chronic combined systolic and diastolic heart failure ([place]) [I50.42 (ICD-10-CM)]  History:        Patient has prior history of Echocardiogram examinations, most recent 12/22/2016. Cardiomegaly, CAD, Prior CABG, Ascending aortic aneurysm, Signs/Symptoms:Hypertensive Heart Disease; Risk Factors:Dyslipidemia.  Sonographer:    Luane School Referring Phys: [phone] Richardo Priest   Sonographer Comments: Suboptimal apical window. IMPRESSIONS   1. Left ventricular ejection fraction, by estimation, is 50 to 55%. The left ventricle has low normal function. The left ventricle has no regional wall motion abnormalities. Left ventricular diastolic parameters are consistent with Grade I diastolic dysfunction (impaired relaxation). 2. Right ventricular systolic function is normal. The right ventricular size is normal. There is normal pulmonary artery systolic pressure. 3. The mitral valve is normal in structure. No evidence of mitral valve regurgitation. No evidence of mitral stenosis. 4. The aortic valve is normal in structure. Aortic valve regurgitation is not visualized. No aortic stenosis is present. 5. There is moderate dilatation of the ascending aorta measuring 43 mm. 6. The inferior vena cava is normal in size with greater than 50% respiratory variability, suggesting right atrial pressure of 3 mmHg.  FINDINGS Left Ventricle: Left ventricular ejection fraction, by estimation, is 50 to 55%. The left ventricle has low normal function. The left ventricle has no regional wall motion abnormalities. Definity contrast  agent was given IV to delineate the  left ventricular endocardial borders. The left ventricular internal cavity size was normal in size. There is no left ventricular hypertrophy. Left ventricular diastolic parameters are consistent with Grade I diastolic dysfunction (impaired relaxation).  Right Ventricle: The right ventricular size is normal. No increase in right ventricular wall thickness. Right ventricular systolic function is normal. There is normal pulmonary artery systolic pressure. The tricuspid regurgitant velocity is 2.09 m/s, and with an assumed right atrial pressure of 3 mmHg, the estimated right ventricular systolic pressure is [id number] mmHg.  Left Atrium: Left atrial size was normal in size.  Right Atrium: Right atrial size was normal in size.  Pericardium: There is no evidence of pericardial effusion.  Mitral Valve: The mitral valve is normal in structure. Normal mobility of the mitral valve leaflets. No evidence of mitral valve regurgitation. No evidence of mitral valve stenosis.  Tricuspid Valve: The tricuspid valve is normal in structure. Tricuspid valve regurgitation is mild . No evidence of tricuspid stenosis.  Aortic Valve: The aortic valve is normal in structure. Aortic valve regurgitation is not visualized. No aortic stenosis is present.  Pulmonic Valve: The pulmonic valve was normal in structure. Pulmonic valve regurgitation is not visualized. No evidence of pulmonic stenosis.  Aorta: The aortic root is normal in size and structure. There is moderate dilatation of the ascending aorta measuring 43 mm.  Venous: The inferior vena cava is normal in size with greater than 50% respiratory variability, suggesting right atrial pressure of 3 mmHg.  IAS/Shunts: No atrial level shunt detected by color flow Doppler.   LEFT VENTRICLE PLAX 2D LVIDd:         5.50 cm     Diastology LVIDs:         4.80 cm     LV e' lateral:   6.53 cm/s LV PW:         1.20 cm     LV E/e' lateral: 7.6 LV IVS:        1.20 cm     LV e'  medial:    4.90 cm/s LVOT diam:     2.50 cm     LV E/e' medial:  10.1 LV SV:         76 LV SV Index:   35 LVOT Area:     4.91 cm  LV Volumes (MOD) LV vol d, MOD A4C: 64.8 ml LV vol s, MOD A4C: 32.6 ml LV SV MOD A4C:     64.8 ml  RIGHT VENTRICLE            IVC RV S prime:     5.66 cm/s  IVC diam: 1.50 cm TAPSE (M-mode): 1.4 cm  LEFT ATRIUM             Index       RIGHT ATRIUM           Index LA diam:        4.30 cm 1.97 cm/m  RA Area:     16.00 cm LA Vol (A2C):   44.6 ml 20.47 ml/m RA Volume:   43.10 ml  19.78 ml/m LA Vol (A4C):   50.0 ml 22.95 ml/m LA Biplane Vol: 51.0 ml 23.41 ml/m AORTIC VALVE LVOT Vmax:   68.00 cm/s LVOT Vmean:  48.600 cm/s LVOT VTI:    0.155 m  AORTA Ao Root diam: 4.00 cm Ao Asc diam:  4.30 cm  MITRAL VALVE  TRICUSPID VALVE MV Area (PHT): 2.20 cm    TR Peak grad:   17.5 mmHg MV Decel Time: 345 msec    TR Vmax:        209.00 cm/s MV E velocity: 49.50 cm/s MV A velocity: 80.50 cm/s  SHUNTS MV E/A ratio:  0.61        Systemic VTI:  0.16 m Systemic Diam: 2.50 cm  Jenne Campus MD Electronically signed by Jenne Campus MD Signature Date/Time: 08/11/2019/4:59:55 PM    Final              Recent Labs: 01/03/2022: B Natriuretic Peptide 101.7; TSH 5.277 01/06/2022: BUN 32; Creatinine, Ser 0.96; Hemoglobin 11.2; Platelets 129; Potassium 4.0; Sodium 136  Recent Lipid Panel No results found for: "CHOL", "TRIG", "HDL", "CHOLHDL", "VLDL", "LDLCALC", "LDLDIRECT"  Physical Exam:    VS:  BP 120/70 (BP Location: Left Arm, Patient Position: Sitting, Cuff Size: Normal)   Pulse 78   Ht '6\' 2"'$  (1.88 m)   Wt 189 lb (85.7 kg)   SpO2 97%   BMI 24.27 kg/m     Wt Readings from Last 3 Encounters:  03/27/22 189 lb (85.7 kg)  09/25/21 190 lb 12.8 oz (86.5 kg)  03/24/21 190 lb (86.2 kg)     GEN:  Well nourished, well developed in no acute distress HEENT: Normal NECK: No JVD; No carotid bruits LYMPHATICS: No  lymphadenopathy CARDIAC: RRR, no murmurs, rubs, gallops RESPIRATORY:  Clear to auscultation without rales, wheezing or rhonchi  ABDOMEN: Soft, non-tender, non-distended MUSCULOSKELETAL:  No edema; No deformity  SKIN: Warm and dry NEUROLOGIC:  Alert and oriented x 3 PSYCHIATRIC:  Normal affect    Signed, Shirlee More, MD  03/27/2022 9:39 AM    [place]

## 2022-06-20 ENCOUNTER — Other Ambulatory Visit: Payer: Self-pay | Admitting: Cardiology

## 2022-10-05 ENCOUNTER — Other Ambulatory Visit: Payer: Self-pay | Admitting: Cardiology

## 2022-10-12 NOTE — Progress Notes (Unsigned)
Cardiology Office Note:    Date:  10/13/2022   ID:  Jacob Weber, DOB [birth year], MRN [id number]  PCP:  Olive Bass, MD  Cardiologist:  Norman Herrlich, MD    Referring MD: Olive Bass, MD    ASSESSMENT:    1. Coronary artery disease involving native coronary artery of native heart with angina pectoris (HCC)   2. Hypertensive heart disease with heart failure (HCC)   3. Mixed hyperlipidemia   4. Statin intolerance   5. Aneurysm of ascending aorta without rupture ([facility])    PLAN:    In order of problems listed above:  Stable CAD following remote CABG having no anginal discomfort continue treatment with aspirin beta-blocker and he will not except lipid-lowering therapy previously statin intolerant.  Stable EKG.  This time I do not think he requires an ischemia evaluation No fluid overload can continue his current loop diuretic and beta-blocker Poorly controlled will not accept lipid-lowering therapy previously statin intolerant Stable most recent echo cardiogram   Next appointment: 1 year   Medication Adjustments/Labs and Tests Ordered: Current medicines are reviewed at length with the patient today.  Concerns regarding medicines are outlined above.  Orders Placed This Encounter  Procedures   EKG 12-Lead   No orders of the defined types were placed in this encounter.    History of Present Illness:    Jacob Weber is a 82 y.o. male with a hx of CAD with remote CABG 1997 heart failure with low normal ejection Weber of 50 to 55% dyslipidemia and statin intolerance hypertensive heart disease with heart failure COPD lung nodule and moderate enlargement of the ascending aorta 43 mmHg last seen 03/27/2022.  Compliance with diet, lifestyle and medications: Yes  Overall is done well remains very active he is an outdoorsman he says he is tired at the end of the day but no exercise intolerance edema shortness of breath chest pain palpitation or syncope I again  approached about lipid-lowering therapy nonstatin's he is not interested. Past Medical History:  Diagnosis Date   Acquired hypothyroidism 03/13/2015   Acute bronchitis due to other specified organisms 04/22/2016   Angina pectoris ([facility]) 03/29/2016   Ascending aortic aneurysm ([facility]) 06/11/2016   Overview:  2018: CT 4.2 cm   Cardiomyopathy, secondary ([facility]) 12/04/2014   Chronic combined systolic and diastolic heart failure ([facility]) 12/04/2014   Chronic coronary artery disease 12/04/2014   Overview:  1. MPS 08/11/2010 with EF 56% and mild apical and septal iscjhemia 2. Cath 03/17/2011 with CTO of LAD and RCA, LTA is patent to D, SVG is patent to the RCA, SVG to LAD is chronically occluded, EF 58%   Chronic cough 06/23/2016   Overview:  Onset 2015.  CT chest mild changes.    Last Assessment & Plan:  Here for evaluation of his upper airway from pulmonology due to chronic cough. Recently had his PPI increased to twice a day and was started on an inhaler.  He has noticed improvement in his cough, but it still bothersome.  No ACE inhibitor.  Cough seems to be much worse in the spring. EXAM by indirect laryngoscopy shows normal true vocal cords move well on phonation.  3+ edema of the arytenoid. PLAN: Reassured his larynx looks okay.  Mild arytenoid edema suggestive of reflux-induced irritation.  I agree with everything that has been done.  Let us see how he does with his new inhaler twice a day PPI therapy for the month of June.  If he  still persists with a bothersome cough, he will call to set up allergy testing.  Call sooner if symptoms are worsening.   Coronary artery disease    Disease of thyroid gland 10/15/2016   Emphysema of lung (HCC) 06/11/2016   Overview:  2018: mild changes on CT, remote cigs   Esophagitis, erosive 03/29/2016   Overview:  (2012) DX/RX: EGD, CHRONIC PPI NO GI RECHECK;   Essential tremor 03/29/2016   GERD (gastroesophageal reflux disease)    Hyperlipidemia 12/04/2014   Hypertension     Hypertensive heart disease with heart failure (HCC) 12/04/2014   Irritable bowel syndrome without diarrhea 02/28/2015   MI (myocardial infarction) ([facility]) 10/15/2016   Mixed hyperlipidemia 12/04/2014   Pneumonitis 06/10/2016   Overview:  2018   Preventative health care 04/11/2015   Screening for prostate cancer 04/12/2015   Overview:  2017: DRE declined   Secondary cardiomyopathy (HCC) 12/04/2014   Solitary pulmonary nodule 06/11/2016   Overview:  09/10/16: CT 9 mm LLL, stable on f/u - recheck February 2019   Thyroid disease    Trigeminal neuralgia 03/29/2016    Current Medications: No outpatient medications have been marked as taking for the 10/13/22 encounter (Office Visit) with Baldo Daub, MD.      EKGs/Labs/Other Studies Reviewed:    The following studies were reviewed today:  EKG Interpretation Date/Time:  Tuesday October 13 2022 13:05:06 EDT Ventricular Rate:  67 PR Interval:  242 QRS Duration:  86 QT Interval:  398 QTC Calculation: 420 R Axis:   -2  Text Interpretation: Sinus rhythm with 1st degree A-V block Septal infarct , age undetermined When compared with ECG of 04-Jan-2022 01:41, Premature ventricular complexes are no longer Present Septal infarct is now Present T wave inversion now evident in Inferior leads Confirmed by Norman Herrlich ([id number]) on 10/13/2022 1:06:28 PM   Recent Labs: 01/03/2022: B Natriuretic Peptide 101.7; TSH 5.277 01/06/2022: BUN 32; Creatinine, Ser 0.96; Hemoglobin 11.2; Platelets 129; Potassium 4.0; Sodium 136  Recent Lipid Panel No results found for: "CHOL", "TRIG", "HDL", "CHOLHDL", "VLDL", "LDLCALC", "LDLDIRECT"  Physical Exam:    VS:  There were no vitals taken for this visit.    Wt Readings from Last 3 Encounters:  03/27/22 189 lb (85.7 kg)  09/25/21 190 lb 12.8 oz (86.5 kg)  03/24/21 190 lb (86.2 kg)     GEN:  Well nourished, well developed in no acute distress HEENT: Normal NECK: No JVD; No carotid bruits LYMPHATICS: No  lymphadenopathy CARDIAC: RRR, no murmurs, rubs, gallops RESPIRATORY:  Clear to auscultation without rales, wheezing or rhonchi  ABDOMEN: Soft, non-tender, non-distended MUSCULOSKELETAL:  No edema; No deformity  SKIN: Warm and dry NEUROLOGIC:  Alert and oriented x 3 PSYCHIATRIC:  Normal affect    Signed, Norman Herrlich, MD  10/13/2022 1:24 PM    [facility]

## 2022-10-13 ENCOUNTER — Ambulatory Visit: Payer: Medicare Other | Attending: Cardiology | Admitting: Cardiology

## 2022-10-13 DIAGNOSIS — I7121 Aneurysm of the ascending aorta, without rupture: Secondary | ICD-10-CM | POA: Insufficient documentation

## 2022-10-13 DIAGNOSIS — E782 Mixed hyperlipidemia: Secondary | ICD-10-CM | POA: Insufficient documentation

## 2022-10-13 DIAGNOSIS — I25119 Atherosclerotic heart disease of native coronary artery with unspecified angina pectoris: Secondary | ICD-10-CM | POA: Insufficient documentation

## 2022-10-13 DIAGNOSIS — Z789 Other specified health status: Secondary | ICD-10-CM | POA: Insufficient documentation

## 2022-10-13 DIAGNOSIS — I110 Hypertensive heart disease with heart failure: Secondary | ICD-10-CM | POA: Diagnosis present

## 2022-10-13 NOTE — Patient Instructions (Signed)
Medication Instructions:  Your physician recommends that you continue on your current medications as directed. Please refer to the Current Medication list given to you today.  *If you need a refill on your cardiac medications before your next appointment, please call your pharmacy*   Lab Work: Your physician recommends that you return for lab work in:   Labs today: BMP  If you have labs (blood work) drawn today and your tests are completely normal, you will receive your results only by: MyChart Message (if you have MyChart) OR A paper copy in the mail If you have any lab test that is abnormal or we need to change your treatment, we will call you to review the results.   Testing/Procedures: None   Follow-Up: At [facility], you and your health needs are our priority.  As part of our continuing mission to provide you with exceptional heart care, we have created designated Provider Care Teams.  These Care Teams include your primary Cardiologist (physician) and Advanced Practice Providers (APPs -  Physician Assistants and Nurse Practitioners) who all work together to provide you with the care you need, when you need it.  We recommend signing up for the patient portal called "MyChart".  Sign up information is provided on this After Visit Summary.  MyChart is used to connect with patients for Virtual Visits (Telemedicine).  Patients are able to view lab/test results, encounter notes, upcoming appointments, etc.  Non-urgent messages can be sent to your provider as well.   To learn more about what you can do with MyChart, go to [email or web address].    Your next appointment:   6 month(s)  Provider:   Norman Herrlich, MD    Other Instructions None

## 2023-04-04 ENCOUNTER — Other Ambulatory Visit: Payer: Self-pay | Admitting: Cardiology

## 2023-04-12 ENCOUNTER — Encounter: Payer: Self-pay | Admitting: Cardiology

## 2023-04-12 DIAGNOSIS — G720 Drug-induced myopathy: Secondary | ICD-10-CM

## 2023-04-12 HISTORY — DX: Drug-induced myopathy: G72.0

## 2023-04-12 NOTE — Progress Notes (Unsigned)
 Cardiology Office Note:  .   Date:  04/13/2023  ID:  Jacob Weber, DOB 03/04/1940, MRN [id number] PCP: Olive Bass, MD  [facility] Providers Cardiologist:  None    History of Present Illness: .   Jacob Weber is a 83 y.o. male with a past medical history of HFpEF, ascending aortic aneurysm, CAD s/p CABG 1997, hypertension, emphysema, GERD, IBS, thyroid disease, history of trigeminal neuralgia, dyslipidemia with statin intolerance.  08/11/2019 echo EF 50 to 55%, grade 1 DD, moderate dilatation ascending aorta 43 mm 11/09/2017 AAA duplex no evidence of abdominal aneurysm 12/22/2016 echo EF 40 to 45%, grade 1 DD, mild  mitral regurgitation  Most recently evaluated by Dr. Dulce Sellar on 10/13/2022, was stable from a cardiac perspective, no changes were made to plan of care and he was advised he can follow-up in 6 months.  He presents today for follow-up of his heart failure and CAD.  He has been doing well from a cardiac perspective, he offers no formal complaints.  He is staying active in his home.  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     ROS: Review of Systems  All other systems reviewed and are negative.    Studies Reviewed: .        Cardiac Studies & Procedures   ______________________________________________________________________________________________     ECHOCARDIOGRAM  ECHOCARDIOGRAM COMPLETE 08/11/2019  Narrative ECHOCARDIOGRAM REPORT    Patient Name:   Jacob Weber Date of Exam: 08/11/2019 Medical Rec #:  [id number]       Height:       74.5 in Accession #:    [id number]      Weight:       199.0 lb Date of Birth:  [birth year]       BSA:          2.179 m Patient Age:    79 years        BP:           124/70 mmHg Patient Gender: M               HR:           70 bpm. Exam Location:  [place]  Procedure: 2D Echo  and Intracardiac Opacification Agent  Indications:    Chronic combined systolic and diastolic heart failure (HCC) [I50.42 (ICD-10-CM)]  History:        Patient has prior history of Echocardiogram examinations, most recent 12/22/2016. Cardiomegaly, CAD, Prior CABG, Ascending aortic aneurysm, Signs/Symptoms:Hypertensive Heart Disease; Risk Factors:Dyslipidemia.  Sonographer:    Louie Boston Referring Phys: [phone] Baldo Daub   Sonographer Comments: Suboptimal apical window. IMPRESSIONS   1. Left ventricular ejection Weber, by estimation, is 50 to 55%. The left ventricle has low normal function. The left ventricle has no regional wall motion abnormalities. Left ventricular diastolic parameters are consistent with Grade I diastolic dysfunction (impaired relaxation). 2. Right ventricular systolic function is normal. The right ventricular size is normal. There is normal pulmonary artery systolic pressure. 3. The mitral valve is normal in structure. No evidence of mitral valve regurgitation. No evidence of mitral stenosis. 4. The aortic valve is normal in structure. Aortic valve regurgitation is not visualized. No aortic stenosis is present. 5. There is moderate dilatation of the ascending aorta measuring 43 mm. 6. The inferior vena cava is normal in size with greater than 50%  respiratory variability, suggesting right atrial pressure of 3 mmHg.  FINDINGS Left Ventricle: Left ventricular ejection Weber, by estimation, is 50 to 55%. The left ventricle has low normal function. The left ventricle has no regional wall motion abnormalities. Definity contrast agent was given IV to delineate the left ventricular endocardial borders. The left ventricular internal cavity size was normal in size. There is no left ventricular hypertrophy. Left ventricular diastolic parameters are consistent with Grade I diastolic dysfunction (impaired relaxation).  Right Ventricle: The right ventricular size is  normal. No increase in right ventricular wall thickness. Right ventricular systolic function is normal. There is normal pulmonary artery systolic pressure. The tricuspid regurgitant velocity is 2.09 m/s, and with an assumed right atrial pressure of 3 mmHg, the estimated right ventricular systolic pressure is 20.5 mmHg.  Left Atrium: Left atrial size was normal in size.  Right Atrium: Right atrial size was normal in size.  Pericardium: There is no evidence of pericardial effusion.  Mitral Valve: The mitral valve is normal in structure. Normal mobility of the mitral valve leaflets. No evidence of mitral valve regurgitation. No evidence of mitral valve stenosis.  Tricuspid Valve: The tricuspid valve is normal in structure. Tricuspid valve regurgitation is mild . No evidence of tricuspid stenosis.  Aortic Valve: The aortic valve is normal in structure. Aortic valve regurgitation is not visualized. No aortic stenosis is present.  Pulmonic Valve: The pulmonic valve was normal in structure. Pulmonic valve regurgitation is not visualized. No evidence of pulmonic stenosis.  Aorta: The aortic root is normal in size and structure. There is moderate dilatation of the ascending aorta measuring 43 mm.  Venous: The inferior vena cava is normal in size with greater than 50% respiratory variability, suggesting right atrial pressure of 3 mmHg.  IAS/Shunts: No atrial level shunt detected by color flow Doppler.   LEFT VENTRICLE PLAX 2D LVIDd:         5.50 cm     Diastology LVIDs:         4.80 cm     LV e' lateral:   6.53 cm/s LV PW:         1.20 cm     LV E/e' lateral: 7.6 LV IVS:        1.20 cm     LV e' medial:    4.90 cm/s LVOT diam:     2.50 cm     LV E/e' medial:  10.1 LV SV:         76 LV SV Index:   35 LVOT Area:     4.91 cm  LV Volumes (MOD) LV vol d, MOD A4C: 64.8 ml LV vol s, MOD A4C: 32.6 ml LV SV MOD A4C:     64.8 ml  RIGHT VENTRICLE            IVC RV S prime:     5.66 cm/s  IVC  diam: 1.50 cm TAPSE (M-mode): 1.4 cm  LEFT ATRIUM             Index       RIGHT ATRIUM           Index LA diam:        4.30 cm 1.97 cm/m  RA Area:     16.00 cm LA Vol (A2C):   44.6 ml 20.47 ml/m RA Volume:   43.10 ml  19.78 ml/m LA Vol (A4C):   50.0 ml 22.95 ml/m LA Biplane Vol: 51.0 ml 23.41 ml/m AORTIC VALVE LVOT Vmax:  68.00 cm/s LVOT Vmean:  48.600 cm/s LVOT VTI:    0.155 m  AORTA Ao Root diam: 4.00 cm Ao Asc diam:  4.30 cm  MITRAL VALVE               TRICUSPID VALVE MV Area (PHT): 2.20 cm    TR Peak grad:   17.5 mmHg MV Decel Time: 345 msec    TR Vmax:        209.00 cm/s MV E velocity: 49.50 cm/s MV A velocity: 80.50 cm/s  SHUNTS MV E/A ratio:  0.61        Systemic VTI:  0.16 m Systemic Diam: 2.50 cm  Gypsy Balsam MD Electronically signed by Gypsy Balsam MD Signature Date/Time: 08/11/2019/4:59:55 PM    Final          ______________________________________________________________________________________________      Risk Assessment/Calculations:             Physical Exam:   VS:  BP 116/68   Pulse 76   Ht 6\' 2"  (1.88 m)   Wt 185 lb (83.9 kg)   SpO2 96%   BMI 23.75 kg/m    Wt Readings from Last 3 Encounters:  04/13/23 185 lb (83.9 kg)  03/27/22 189 lb (85.7 kg)  09/25/21 190 lb 12.8 oz (86.5 kg)    GEN: Well nourished, well developed in no acute distress NECK: No JVD; No carotid bruits CARDIAC: RRR, no murmurs, rubs, gallops RESPIRATORY:  Clear to auscultation without rales, wheezing or rhonchi  ABDOMEN: Soft, non-tender, non-distended EXTREMITIES:  No edema; No deformity   ASSESSMENT AND PLAN: .   CAD-s/p CABG 1997. Stable with no anginal symptoms. No indication for ischemic evaluation.  Continue aspirin 81 mg daily, continue Coreg 12.5 mg twice daily, continue nitroglycerin as needed.  HFpEF-NYHA class I, euvolemic, continue Coreg 12.5 mg twice daily, continue Lasix 40 mg daily.  He is asymptomatic and does not want to consider any  further GDMT at this time.  Ascending aortic dilatation-most recent imaging in 2021 revealed stable dilatation and unchanged from prior imaging.  We discussed surveillance with repeat echocardiogram however he does not feel is warranted as he would not be inclined to entertain any surgical interventions.  His blood pressure is well-controlled he is a non-smoker.  Dyslipidemia-statin intolerant, also not willing to consider any lipid-lowering agents.  He did politely declines evidence cholesterol checked.       Dispo: Follow-up in 6 months.  Signed, Flossie Dibble, NP

## 2023-04-13 ENCOUNTER — Ambulatory Visit: Payer: Medicare Other | Attending: Cardiology | Admitting: Cardiology

## 2023-04-13 ENCOUNTER — Encounter: Payer: Self-pay | Admitting: Cardiology

## 2023-04-13 VITALS — BP 116/68 | HR 76 | Ht 74.0 in | Wt 185.0 lb

## 2023-04-13 DIAGNOSIS — I25119 Atherosclerotic heart disease of native coronary artery with unspecified angina pectoris: Secondary | ICD-10-CM | POA: Diagnosis not present

## 2023-04-13 DIAGNOSIS — I5032 Chronic diastolic (congestive) heart failure: Secondary | ICD-10-CM | POA: Diagnosis not present

## 2023-04-13 DIAGNOSIS — E782 Mixed hyperlipidemia: Secondary | ICD-10-CM

## 2023-04-13 DIAGNOSIS — I7121 Aneurysm of the ascending aorta, without rupture: Secondary | ICD-10-CM | POA: Diagnosis not present

## 2023-04-13 DIAGNOSIS — I110 Hypertensive heart disease with heart failure: Secondary | ICD-10-CM

## 2023-04-13 NOTE — Patient Instructions (Signed)
 Medication Instructions:  Your physician recommends that you continue on your current medications as directed. Please refer to the Current Medication list given to you today.  *If you need a refill on your cardiac medications before your next appointment, please call your pharmacy*   Lab Work: NONE If you have labs (blood work) drawn today and your tests are completely normal, you will receive your results only by: MyChart Message (if you have MyChart) OR A paper copy in the mail If you have any lab test that is abnormal or we need to change your treatment, we will call you to review the results.   Testing/Procedures: NONE   Follow-Up: At [facility], you and your health needs are our priority.  As part of our continuing mission to provide you with exceptional heart care, we have created designated Provider Care Teams.  These Care Teams include your primary Cardiologist (physician) and Advanced Practice Providers (APPs -  Physician Assistants and Nurse Practitioners) who all work together to provide you with the care you need, when you need it.  We recommend signing up for the patient portal called "MyChart".  Sign up information is provided on this After Visit Summary.  MyChart is used to connect with patients for Virtual Visits (Telemedicine).  Patients are able to view lab/test results, encounter notes, upcoming appointments, etc.  Non-urgent messages can be sent to your provider as well.   To learn more about what you can do with MyChart, go to [email or web address].    Your next appointment:   6 month(s)  Provider:   Norman Herrlich, MD    Other Instructions

## 2023-05-19 DIAGNOSIS — H401133 Primary open-angle glaucoma, bilateral, severe stage: Secondary | ICD-10-CM | POA: Diagnosis not present

## 2023-09-13 ENCOUNTER — Other Ambulatory Visit: Payer: Self-pay

## 2023-09-13 MED ORDER — CARVEDILOL 25 MG PO TABS: 12.5000 mg | ORAL_TABLET | Freq: Two times a day (BID) | ORAL | 1 refills | Status: DC

## 2023-09-24 DIAGNOSIS — H401133 Primary open-angle glaucoma, bilateral, severe stage: Secondary | ICD-10-CM | POA: Diagnosis not present

## 2023-09-30 ENCOUNTER — Other Ambulatory Visit: Payer: Self-pay | Admitting: Cardiology

## 2023-10-07 DIAGNOSIS — H524 Presbyopia: Secondary | ICD-10-CM | POA: Diagnosis not present

## 2023-10-07 DIAGNOSIS — I2510 Atherosclerotic heart disease of native coronary artery without angina pectoris: Secondary | ICD-10-CM | POA: Insufficient documentation

## 2023-10-08 NOTE — Progress Notes (Unsigned)
 Cardiology Office Note:  .   Date:  10/12/2023  ID:  Jacob Weber, DOB 1940-07-26, MRN [id number] PCP: No primary care provider on file.  [facility] Providers Cardiologist:  Redell Leiter, MD    History of Present Illness: .   Jacob Weber is a 83 y.o. male with a past medical history of HFpEF, ascending aortic aneurysm, CAD s/p CABG 1997, hypertension, emphysema, GERD, IBS, thyroid disease, history of trigeminal neuralgia, dyslipidemia with statin intolerance.  08/11/2019 echo EF 50 to 55%, grade 1 DD, moderate dilatation ascending aorta 43 mm 11/09/2017 AAA duplex no evidence of abdominal aneurysm 12/22/2016 echo EF 40 to 45%, grade 1 DD, mild  mitral regurgitation  He is a longstanding patient of Dr. Leiter, initially establishing with him in 2016 for the evaluation and management of his coronary artery disease, with history of bypass surgery in 1997.  In 2021 he had an echo revealing moderate dilatation of his ascending aorta at 43 mm but has since decided he did not want any further workup for this.  Evaluated by Dr. Leiter on 10/13/2022, stable from a cardiac perspective no changes were made to medications or plan of care and he was advised he can follow-up in 1 year.  Most recently was evaluated by myself on 04/13/2023, he had been doing stable from a cardiac perspective without any formal complaints, staying very physically active around his home and most bothered by issues with glaucoma, discussed surveillance of his ascending aortic aneurysm but he was not interested in this nor was he interested in any lipid-lowering medications no changes were made to medications or plan of care and was advised to follow-up in 6 months.  He presents today for follow-up.  He has been doing well since he was last evaluated in our office, no formal complaints from a cardiac perspective.  We discussed the dilatation of his ascending aorta, he does not recall us  discussing this in the past but he  is amenable today for repeat imaging.  [facility] will [facility] completed next week.  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 He denies chest pain, palpitations, dyspnea, pnd, orthopnea, n, v, dizziness, syncope, edema, weight gain, or early satiety.    ROS: Review of Systems  Eyes:        Glaucoma  Musculoskeletal:  Positive for back pain.  All other systems reviewed and are negative.    Studies Reviewed: Jacob Weber   EKG Interpretation Date/Time:  Tuesday October 12 2023 10:24:20 EDT Ventricular Rate:  65 PR Interval:  250 QRS Duration:  92 QT Interval:  422 QTC Calculation: 438 R Axis:   -19  Text Interpretation: Sinus rhythm with 1st degree A-V block Low voltage QRS When compared with ECG of 13-Oct-2022 13:05, No significant change was found Confirmed by Carlin Nest [phone]) on 10/12/2023 10:29:36 AM    Cardiac Studies & Procedures   ______________________________________________________________________________________________     ECHOCARDIOGRAM  ECHOCARDIOGRAM COMPLETE 08/11/2019  Narrative ECHOCARDIOGRAM REPORT    Patient Name:   Jacob Weber Date of Exam: 08/11/2019 Medical Rec #:  [id number]       Height:       74.5 in Accession #:    [id number]      Weight:       199.0 lb Date of Birth:  10/24/1940       BSA:  2.179 m Patient Age:    79 years        BP:           124/70 mmHg Patient Gender: M               HR:           70 bpm. Exam Location:  [place]  Procedure: 2D Echo and Intracardiac Opacification Agent  Indications:    Chronic combined systolic and diastolic heart failure (HCC) [I50.42 (ICD-10-CM)]  History:        Patient has prior history of Echocardiogram examinations, most recent 12/22/2016. Cardiomegaly, CAD, Prior CABG, Ascending aortic aneurysm, Signs/Symptoms:Hypertensive Heart Disease; Risk Factors:Dyslipidemia.  Sonographer:     Jacob Silvas Referring Phys: [phone] REDELL JINNY LEITER   Sonographer Comments: Suboptimal apical window. IMPRESSIONS   1. Left ventricular ejection fraction, by estimation, is 50 to 55%. The left ventricle has low normal function. The left ventricle has no regional wall motion abnormalities. Left ventricular diastolic parameters are consistent with Grade I diastolic dysfunction (impaired relaxation). 2. Right ventricular systolic function is normal. The right ventricular size is normal. There is normal pulmonary artery systolic pressure. 3. The mitral valve is normal in structure. No evidence of mitral valve regurgitation. No evidence of mitral stenosis. 4. The aortic valve is normal in structure. Aortic valve regurgitation is not visualized. No aortic stenosis is present. 5. There is moderate dilatation of the ascending aorta measuring 43 mm. 6. The inferior vena cava is normal in size with greater than 50% respiratory variability, suggesting right atrial pressure of 3 mmHg.  FINDINGS Left Ventricle: Left ventricular ejection fraction, by estimation, is 50 to 55%. The left ventricle has low normal function. The left ventricle has no regional wall motion abnormalities. Definity  contrast agent was given IV to delineate the left ventricular endocardial borders. The left ventricular internal cavity size was normal in size. There is no left ventricular hypertrophy. Left ventricular diastolic parameters are consistent with Grade I diastolic dysfunction (impaired relaxation).  Right Ventricle: The right ventricular size is normal. No increase in right ventricular wall thickness. Right ventricular systolic function is normal. There is normal pulmonary artery systolic pressure. The tricuspid regurgitant velocity is 2.09 m/s, and with an assumed right atrial pressure of 3 mmHg, the estimated right ventricular systolic pressure is 20.5 mmHg.  Left Atrium: Left atrial size was normal in size.  Right Atrium:  Right atrial size was normal in size.  Pericardium: There is no evidence of pericardial effusion.  Mitral Valve: The mitral valve is normal in structure. Normal mobility of the mitral valve leaflets. No evidence of mitral valve regurgitation. No evidence of mitral valve stenosis.  Tricuspid Valve: The tricuspid valve is normal in structure. Tricuspid valve regurgitation is mild . No evidence of tricuspid stenosis.  Aortic Valve: The aortic valve is normal in structure. Aortic valve regurgitation is not visualized. No aortic stenosis is present.  Pulmonic Valve: The pulmonic valve was normal in structure. Pulmonic valve regurgitation is not visualized. No evidence of pulmonic stenosis.  Aorta: The aortic root is normal in size and structure. There is moderate dilatation of the ascending aorta measuring 43 mm.  Venous: The inferior vena cava is normal in size with greater than 50% respiratory variability, suggesting right atrial pressure of 3 mmHg.  IAS/Shunts: No atrial level shunt detected by color flow Doppler.   LEFT VENTRICLE PLAX 2D LVIDd:         5.50 cm     Diastology  LVIDs:         4.80 cm     LV e' lateral:   6.53 cm/s LV PW:         1.20 cm     LV E/e' lateral: 7.6 LV IVS:        1.20 cm     LV e' medial:    4.90 cm/s LVOT diam:     2.50 cm     LV E/e' medial:  10.1 LV SV:         76 LV SV Index:   35 LVOT Area:     4.91 cm  LV Volumes (MOD) LV vol d, MOD A4C: 64.8 ml LV vol s, MOD A4C: 32.6 ml LV SV MOD A4C:     64.8 ml  RIGHT VENTRICLE            IVC RV S prime:     5.66 cm/s  IVC diam: 1.50 cm TAPSE (M-mode): 1.4 cm  LEFT ATRIUM             Index       RIGHT ATRIUM           Index LA diam:        4.30 cm 1.97 cm/m  RA Area:     16.00 cm LA Vol (A2C):   44.6 ml 20.47 ml/m RA Volume:   43.10 ml  19.78 ml/m LA Vol (A4C):   50.0 ml 22.95 ml/m LA Biplane Vol: 51.0 ml 23.41 ml/m AORTIC VALVE LVOT Vmax:   68.00 cm/s LVOT Vmean:  48.600 cm/s LVOT VTI:     0.155 m  AORTA Ao Root diam: 4.00 cm Ao Asc diam:  4.30 cm  MITRAL VALVE               TRICUSPID VALVE MV Area (PHT): 2.20 cm    TR Peak grad:   17.5 mmHg MV Decel Time: 345 msec    TR Vmax:        209.00 cm/s MV E velocity: 49.50 cm/s MV A velocity: 80.50 cm/s  SHUNTS MV E/A ratio:  0.61        Systemic VTI:  0.16 m Systemic Diam: 2.50 cm  Lamar Fitch MD Electronically signed by Lamar Fitch MD Signature Date/Time: 08/11/2019/4:59:55 PM    Final          ______________________________________________________________________________________________     EKG Interpretation Date/Time:  Tuesday October 12 2023 10:24:20 EDT Ventricular Rate:  65 PR Interval:  250 QRS Duration:  92 QT Interval:  422 QTC Calculation: 438 R Axis:   -19  Text Interpretation: Sinus rhythm with 1st degree A-V block Low voltage QRS When compared with ECG of 13-Oct-2022 13:05, No significant change was found Confirmed by Carlin Nest [phone]) on 10/12/2023 10:29:36 AM   Risk Assessment/Calculations:            Physical Exam:   VS:  BP 118/66   Pulse 65   Ht 6' 2 (1.88 m)   Wt 183 lb 3.2 oz (83.1 kg)   SpO2 99%   BMI 23.52 kg/m    Wt Readings from Last 3 Encounters:  10/12/23 183 lb 3.2 oz (83.1 kg)  04/13/23 185 lb (83.9 kg)  03/27/22 189 lb (85.7 kg)    GEN: Well nourished, well developed in no acute distress NECK: No JVD; No carotid bruits CARDIAC: RRR, no murmurs, rubs, gallops RESPIRATORY:  Clear to auscultation without rales, wheezing or rhonchi  ABDOMEN: Soft, non-tender, non-distended EXTREMITIES:  No edema; No deformity   ASSESSMENT AND PLAN: .   CAD-s/p CABG 1997. Stable with no anginal symptoms. No indication for ischemic evaluation.  Continue aspirin  81 mg daily, continue Coreg  25 mg twice daily, continue nitroglycerin as needed-- has not needed.  HFpEF-NYHA class I, euvolemic, continue Coreg  25 mg twice daily, continue Lasix  40 mg daily.  He is asymptomatic  and does not want to consider any further GDMT at this time.  Ascending aortic dilatation-most recent imaging in 2021 revealed stable dilatation and unchanged from prior imaging.  He is amenable to repeat imaging this visit, will arrange for CT chest w/o.   Dyslipidemia-statin intolerant, also not willing to consider any lipid-lowering agents.  He did politely declines evidence cholesterol checked.  Hypertension-blood pressure is well-controlled today at 118/66, continue Coreg  25 mg twice daily.       Dispo: CT chest for surveillance of dilatation of ascending aorta, follow-up in 6 months.  Signed, Delon JAYSON Hoover, NP

## 2023-10-12 ENCOUNTER — Ambulatory Visit: Attending: Cardiology | Admitting: Cardiology

## 2023-10-12 ENCOUNTER — Encounter: Payer: Self-pay | Admitting: Cardiology

## 2023-10-12 VITALS — BP 118/66 | HR 65 | Ht 74.0 in | Wt 183.2 lb

## 2023-10-12 DIAGNOSIS — E782 Mixed hyperlipidemia: Secondary | ICD-10-CM | POA: Diagnosis not present

## 2023-10-12 DIAGNOSIS — I7121 Aneurysm of the ascending aorta, without rupture: Secondary | ICD-10-CM | POA: Diagnosis not present

## 2023-10-12 DIAGNOSIS — I5032 Chronic diastolic (congestive) heart failure: Secondary | ICD-10-CM | POA: Diagnosis not present

## 2023-10-12 DIAGNOSIS — I25119 Atherosclerotic heart disease of native coronary artery with unspecified angina pectoris: Secondary | ICD-10-CM | POA: Diagnosis not present

## 2023-10-12 DIAGNOSIS — I10 Essential (primary) hypertension: Secondary | ICD-10-CM

## 2023-10-12 NOTE — Patient Instructions (Addendum)
 Medication Instructions:  Your physician recommends that you continue on your current medications as directed. Please refer to the Current Medication list given to you today.  *If you need a refill on your cardiac medications before your next appointment, please call your pharmacy*  Lab Work: None If you have labs (blood work) drawn today and your tests are completely normal, you will receive your results only by: MyChart Message (if you have MyChart) OR A paper copy in the mail If you have any lab test that is abnormal or we need to change your treatment, we will call you to review the results.  Testing/Procedures: Non-Cardiac CT scanning, (CAT scanning), is a noninvasive, special x-ray that produces cross-sectional images of the body using x-rays and a computer. CT scans help physicians diagnose and treat medical conditions. For some CT exams, a contrast material is used to enhance visibility in the area of the body being studied. CT scans provide greater clarity and reveal more details than regular x-ray exams.   Follow-Up: At [facility], you and your health needs are our priority.  As part of our continuing mission to provide you with exceptional heart care, our providers are all part of one team.  This team includes your primary Cardiologist (physician) and Advanced Practice Providers or APPs (Physician Assistants and Nurse Practitioners) who all work together to provide you with the care you need, when you need it.  Your next appointment:   6 month(s)  Provider:   Redell Leiter, MD  We recommend signing up for the patient portal called MyChart.  Sign up information is provided on this After Visit Summary.  MyChart is used to connect with patients for Virtual Visits (Telemedicine).  Patients are able to view lab/test results, encounter notes, upcoming appointments, etc.  Non-urgent messages can be sent to your provider as well.   To learn more about what you can do with  MyChart, go to [email or web address].   Other Instructions None

## 2023-10-14 LAB — LAB REPORT - SCANNED
A1c: 5.7
EGFR: 55

## 2023-10-29 ENCOUNTER — Encounter (HOSPITAL_BASED_OUTPATIENT_CLINIC_OR_DEPARTMENT_OTHER): Payer: Self-pay | Admitting: Radiology

## 2023-10-29 ENCOUNTER — Ambulatory Visit (HOSPITAL_BASED_OUTPATIENT_CLINIC_OR_DEPARTMENT_OTHER)
Admission: RE | Admit: 2023-10-29 | Discharge: 2023-10-29 | Disposition: A | Discharge status: Home / Self Care | Source: Ambulatory Visit | Attending: Cardiology | Admitting: Cardiology

## 2023-10-29 DIAGNOSIS — J432 Centrilobular emphysema: Secondary | ICD-10-CM | POA: Diagnosis not present

## 2023-10-29 DIAGNOSIS — E782 Mixed hyperlipidemia: Secondary | ICD-10-CM | POA: Diagnosis not present

## 2023-10-29 DIAGNOSIS — I7121 Aneurysm of the ascending aorta, without rupture: Secondary | ICD-10-CM

## 2023-10-29 DIAGNOSIS — I25119 Atherosclerotic heart disease of native coronary artery with unspecified angina pectoris: Secondary | ICD-10-CM

## 2023-10-29 DIAGNOSIS — I5032 Chronic diastolic (congestive) heart failure: Secondary | ICD-10-CM | POA: Diagnosis not present

## 2023-11-02 ENCOUNTER — Ambulatory Visit: Payer: Self-pay | Admitting: Cardiology

## 2023-11-03 ENCOUNTER — Other Ambulatory Visit: Payer: Self-pay

## 2023-11-18 ENCOUNTER — Emergency Department (HOSPITAL_COMMUNITY)

## 2023-11-18 ENCOUNTER — Encounter (HOSPITAL_COMMUNITY): Payer: Self-pay

## 2023-11-18 ENCOUNTER — Inpatient Hospital Stay (HOSPITAL_COMMUNITY)
Admission: EM | Admit: 2023-11-18 | Discharge: 2023-11-20 | DRG: 322 | Disposition: A | Discharge status: Home / Self Care | Attending: Internal Medicine | Admitting: Internal Medicine

## 2023-11-18 ENCOUNTER — Other Ambulatory Visit: Payer: Self-pay

## 2023-11-18 DIAGNOSIS — Z955 Presence of coronary angioplasty implant and graft: Secondary | ICD-10-CM

## 2023-11-18 DIAGNOSIS — I110 Hypertensive heart disease with heart failure: Secondary | ICD-10-CM | POA: Diagnosis present

## 2023-11-18 DIAGNOSIS — E079 Disorder of thyroid, unspecified: Secondary | ICD-10-CM | POA: Diagnosis present

## 2023-11-18 DIAGNOSIS — E782 Mixed hyperlipidemia: Secondary | ICD-10-CM | POA: Diagnosis present

## 2023-11-18 DIAGNOSIS — I25119 Atherosclerotic heart disease of native coronary artery with unspecified angina pectoris: Secondary | ICD-10-CM | POA: Diagnosis present

## 2023-11-18 DIAGNOSIS — Z87891 Personal history of nicotine dependence: Secondary | ICD-10-CM | POA: Diagnosis not present

## 2023-11-18 DIAGNOSIS — Z79890 Hormone replacement therapy (postmenopausal): Secondary | ICD-10-CM | POA: Diagnosis not present

## 2023-11-18 DIAGNOSIS — K219 Gastro-esophageal reflux disease without esophagitis: Secondary | ICD-10-CM | POA: Diagnosis present

## 2023-11-18 DIAGNOSIS — K2100 Gastro-esophageal reflux disease with esophagitis, without bleeding: Secondary | ICD-10-CM | POA: Diagnosis present

## 2023-11-18 DIAGNOSIS — I214 Non-ST elevation (NSTEMI) myocardial infarction: Secondary | ICD-10-CM | POA: Diagnosis present

## 2023-11-18 DIAGNOSIS — I10 Essential (primary) hypertension: Secondary | ICD-10-CM | POA: Diagnosis present

## 2023-11-18 DIAGNOSIS — Z888 Allergy status to other drugs, medicaments and biological substances status: Secondary | ICD-10-CM | POA: Diagnosis not present

## 2023-11-18 DIAGNOSIS — N200 Calculus of kidney: Secondary | ICD-10-CM | POA: Diagnosis present

## 2023-11-18 DIAGNOSIS — I25110 Atherosclerotic heart disease of native coronary artery with unstable angina pectoris: Secondary | ICD-10-CM | POA: Diagnosis not present

## 2023-11-18 DIAGNOSIS — Z96611 Presence of right artificial shoulder joint: Secondary | ICD-10-CM | POA: Diagnosis present

## 2023-11-18 DIAGNOSIS — Z8249 Family history of ischemic heart disease and other diseases of the circulatory system: Secondary | ICD-10-CM

## 2023-11-18 DIAGNOSIS — I429 Cardiomyopathy, unspecified: Secondary | ICD-10-CM | POA: Diagnosis present

## 2023-11-18 DIAGNOSIS — Z96641 Presence of right artificial hip joint: Secondary | ICD-10-CM | POA: Diagnosis present

## 2023-11-18 DIAGNOSIS — G250 Essential tremor: Secondary | ICD-10-CM | POA: Diagnosis present

## 2023-11-18 DIAGNOSIS — H409 Unspecified glaucoma: Secondary | ICD-10-CM | POA: Diagnosis present

## 2023-11-18 DIAGNOSIS — H9193 Unspecified hearing loss, bilateral: Secondary | ICD-10-CM | POA: Diagnosis present

## 2023-11-18 DIAGNOSIS — Z825 Family history of asthma and other chronic lower respiratory diseases: Secondary | ICD-10-CM | POA: Diagnosis not present

## 2023-11-18 DIAGNOSIS — I700 Atherosclerosis of aorta: Secondary | ICD-10-CM | POA: Diagnosis not present

## 2023-11-18 DIAGNOSIS — Z823 Family history of stroke: Secondary | ICD-10-CM

## 2023-11-18 DIAGNOSIS — J439 Emphysema, unspecified: Secondary | ICD-10-CM | POA: Diagnosis present

## 2023-11-18 DIAGNOSIS — Z7982 Long term (current) use of aspirin: Secondary | ICD-10-CM

## 2023-11-18 DIAGNOSIS — K589 Irritable bowel syndrome without diarrhea: Secondary | ICD-10-CM | POA: Diagnosis present

## 2023-11-18 DIAGNOSIS — R079 Chest pain, unspecified: Secondary | ICD-10-CM | POA: Diagnosis not present

## 2023-11-18 DIAGNOSIS — I5042 Chronic combined systolic (congestive) and diastolic (congestive) heart failure: Secondary | ICD-10-CM | POA: Diagnosis present

## 2023-11-18 DIAGNOSIS — G500 Trigeminal neuralgia: Secondary | ICD-10-CM | POA: Diagnosis present

## 2023-11-18 DIAGNOSIS — Z96652 Presence of left artificial knee joint: Secondary | ICD-10-CM | POA: Diagnosis present

## 2023-11-18 DIAGNOSIS — I25810 Atherosclerosis of coronary artery bypass graft(s) without angina pectoris: Secondary | ICD-10-CM | POA: Diagnosis present

## 2023-11-18 DIAGNOSIS — Z808 Family history of malignant neoplasm of other organs or systems: Secondary | ICD-10-CM

## 2023-11-18 DIAGNOSIS — I440 Atrioventricular block, first degree: Secondary | ICD-10-CM | POA: Diagnosis present

## 2023-11-18 DIAGNOSIS — E785 Hyperlipidemia, unspecified: Secondary | ICD-10-CM | POA: Diagnosis present

## 2023-11-18 DIAGNOSIS — N179 Acute kidney failure, unspecified: Secondary | ICD-10-CM | POA: Diagnosis present

## 2023-11-18 DIAGNOSIS — I2510 Atherosclerotic heart disease of native coronary artery without angina pectoris: Secondary | ICD-10-CM | POA: Diagnosis present

## 2023-11-18 DIAGNOSIS — E039 Hypothyroidism, unspecified: Secondary | ICD-10-CM | POA: Diagnosis present

## 2023-11-18 DIAGNOSIS — Z79899 Other long term (current) drug therapy: Secondary | ICD-10-CM | POA: Diagnosis not present

## 2023-11-18 DIAGNOSIS — I7121 Aneurysm of the ascending aorta, without rupture: Secondary | ICD-10-CM | POA: Diagnosis present

## 2023-11-18 DIAGNOSIS — I2582 Chronic total occlusion of coronary artery: Secondary | ICD-10-CM | POA: Diagnosis present

## 2023-11-18 DIAGNOSIS — N202 Calculus of kidney with calculus of ureter: Secondary | ICD-10-CM | POA: Diagnosis not present

## 2023-11-18 DIAGNOSIS — Z7902 Long term (current) use of antithrombotics/antiplatelets: Secondary | ICD-10-CM

## 2023-11-18 DIAGNOSIS — I252 Old myocardial infarction: Secondary | ICD-10-CM

## 2023-11-18 DIAGNOSIS — Z951 Presence of aortocoronary bypass graft: Secondary | ICD-10-CM | POA: Diagnosis not present

## 2023-11-18 DIAGNOSIS — R918 Other nonspecific abnormal finding of lung field: Secondary | ICD-10-CM | POA: Diagnosis not present

## 2023-11-18 HISTORY — DX: Non-ST elevation (NSTEMI) myocardial infarction: I21.4

## 2023-11-18 LAB — BASIC METABOLIC PANEL WITH GFR
Anion gap: 12 (ref 5–15)
BUN: 22 mg/dL (ref 8–23)
CO2: 23 mmol/L (ref 22–32)
Calcium: 9 mg/dL (ref 8.9–10.3)
Chloride: 102 mmol/L (ref 98–111)
Creatinine, Ser: 1.4 mg/dL — ABNORMAL HIGH (ref 0.61–1.24)
GFR, Estimated: 50 mL/min — ABNORMAL LOW (ref >60)
Glucose, Bld: 127 mg/dL — ABNORMAL HIGH (ref 70–99)
Potassium: 4.4 mmol/L (ref 3.5–5.1)
Sodium: 137 mmol/L (ref 135–145)

## 2023-11-18 LAB — CBC
HCT: 36.5 % — ABNORMAL LOW (ref 39.0–52.0)
HCT: 40.2 % (ref 39.0–52.0)
Hemoglobin: 13.1 g/dL (ref 13.0–17.0)
Hemoglobin: 13.8 g/dL (ref 13.0–17.0)
MCH: 34.6 pg — ABNORMAL HIGH (ref 26.0–34.0)
MCH: 35.5 pg — ABNORMAL HIGH (ref 26.0–34.0)
MCHC: 34.3 g/dL (ref 30.0–36.0)
MCHC: 35.9 g/dL (ref 30.0–36.0)
MCV: 100.8 fL — ABNORMAL HIGH (ref 80.0–100.0)
MCV: 98.9 fL (ref 80.0–100.0)
Platelets: 132 K/uL — ABNORMAL LOW (ref 150–400)
Platelets: 148 K/uL — ABNORMAL LOW (ref 150–400)
RBC: 3.69 MIL/uL — ABNORMAL LOW (ref 4.22–5.81)
RBC: 3.99 MIL/uL — ABNORMAL LOW (ref 4.22–5.81)
RDW: 13.6 % (ref 11.5–15.5)
RDW: 13.6 % (ref 11.5–15.5)
WBC: 6.2 K/uL (ref 4.0–10.5)
WBC: 7.8 K/uL (ref 4.0–10.5)
nRBC: 0 % (ref 0.0–0.2)
nRBC: 0 % (ref 0.0–0.2)

## 2023-11-18 LAB — COMPREHENSIVE METABOLIC PANEL WITH GFR
ALT: 16 U/L (ref 0–44)
AST: 27 U/L (ref 15–41)
Albumin: 3.8 g/dL (ref 3.5–5.0)
Alkaline Phosphatase: 43 U/L (ref 38–126)
Anion gap: 15 (ref 5–15)
BUN: 22 mg/dL (ref 8–23)
CO2: 20 mmol/L — ABNORMAL LOW (ref 22–32)
Calcium: 9 mg/dL (ref 8.9–10.3)
Chloride: 101 mmol/L (ref 98–111)
Creatinine, Ser: 1.43 mg/dL — ABNORMAL HIGH (ref 0.61–1.24)
GFR, Estimated: 48 mL/min — ABNORMAL LOW (ref >60)
Glucose, Bld: 125 mg/dL — ABNORMAL HIGH (ref 70–99)
Potassium: 4.4 mmol/L (ref 3.5–5.1)
Sodium: 136 mmol/L (ref 135–145)
Total Bilirubin: 1.1 mg/dL (ref 0.0–1.2)
Total Protein: 7 g/dL (ref 6.5–8.1)

## 2023-11-18 LAB — TROPONIN I (HIGH SENSITIVITY)
Troponin I (High Sensitivity): 114 ng/L (ref <18)
Troponin I (High Sensitivity): 502 ng/L (ref <18)
Troponin I (High Sensitivity): 1490 ng/L (ref <18)
Troponin I (High Sensitivity): 2074 ng/L (ref <18)

## 2023-11-18 LAB — LIPASE, BLOOD: Lipase: 29 U/L (ref 11–51)

## 2023-11-18 LAB — BRAIN NATRIURETIC PEPTIDE: B Natriuretic Peptide: 125.1 pg/mL — ABNORMAL HIGH (ref 0.0–100.0)

## 2023-11-18 MED ORDER — HEPARIN SODIUM (PORCINE) 5000 UNIT/ML IJ SOLN
5000.0000 [IU] | Freq: Three times a day (TID) | INTRAMUSCULAR | Status: DC
Start: 2023-11-18 — End: 2023-11-18

## 2023-11-18 MED ORDER — CARVEDILOL 12.5 MG PO TABS
12.5000 mg | ORAL_TABLET | Freq: Two times a day (BID) | ORAL | Status: DC
  Administered 2023-11-18 – 2023-11-20 (×4): 12.5 mg via ORAL
  Filled 2023-11-18 (×4): qty 1

## 2023-11-18 MED ORDER — LEVOTHYROXINE SODIUM 100 MCG PO TABS
100.0000 ug | ORAL_TABLET | Freq: Every day | ORAL | Status: DC
  Administered 2023-11-19 – 2023-11-20 (×2): 100 ug via ORAL
  Filled 2023-11-18 (×2): qty 1

## 2023-11-18 MED ORDER — LATANOPROST 0.005 % OP SOLN
1.0000 [drp] | Freq: Every day | OPHTHALMIC | Status: DC
Start: 2023-11-18 — End: 2023-11-20
  Administered 2023-11-18 – 2023-11-19 (×2): 1 [drp] via OPHTHALMIC
  Filled 2023-11-18 (×2): qty 2.5

## 2023-11-18 MED ORDER — IOHEXOL 350 MG/ML SOLN
75.0000 mL | Freq: Once | INTRAVENOUS | Status: AC | PRN
  Administered 2023-11-18: 75 mL via INTRAVENOUS

## 2023-11-18 MED ORDER — ASPIRIN 81 MG PO CHEW
81.0000 mg | CHEWABLE_TABLET | Freq: Every day | ORAL | Status: DC
  Administered 2023-11-20: 81 mg via ORAL
  Filled 2023-11-18 (×2): qty 1

## 2023-11-18 MED ORDER — HEPARIN BOLUS VIA INFUSION
4000.0000 [IU] | Freq: Once | INTRAVENOUS | Status: AC
  Administered 2023-11-18: 4000 [IU] via INTRAVENOUS
  Filled 2023-11-18: qty 4000

## 2023-11-18 MED ORDER — ALLOPURINOL 100 MG PO TABS
100.0000 mg | ORAL_TABLET | Freq: Every day | ORAL | Status: DC
  Administered 2023-11-18 – 2023-11-19 (×2): 100 mg via ORAL
  Filled 2023-11-18 (×2): qty 1

## 2023-11-18 MED ORDER — ACETAMINOPHEN 325 MG PO TABS: 650.0000 mg | ORAL_TABLET | ORAL | Status: DC | PRN

## 2023-11-18 MED ORDER — FUROSEMIDE 20 MG PO TABS: 40.0000 mg | ORAL_TABLET | Freq: Every day | ORAL | Status: DC

## 2023-11-18 MED ORDER — ONDANSETRON HCL 4 MG/2ML IJ SOLN: 4.0000 mg | Freq: Four times a day (QID) | INTRAMUSCULAR | Status: DC | PRN

## 2023-11-18 MED ORDER — ALLOPURINOL 300 MG PO TABS
300.0000 mg | ORAL_TABLET | Freq: Every morning | ORAL | Status: DC
  Administered 2023-11-20: 300 mg via ORAL
  Filled 2023-11-18 (×2): qty 1

## 2023-11-18 MED ORDER — HEPARIN (PORCINE) 25000 UT/250ML-% IV SOLN
1100.0000 [IU]/h | INTRAVENOUS | Status: DC
  Administered 2023-11-18 – 2023-11-19 (×2): 1100 [IU]/h via INTRAVENOUS
  Filled 2023-11-18 (×2): qty 250

## 2023-11-18 MED ORDER — PANTOPRAZOLE SODIUM 40 MG PO TBEC: 40.0000 mg | DELAYED_RELEASE_TABLET | Freq: Every day | ORAL | Status: DC | PRN

## 2023-11-18 MED ORDER — ALUM & MAG HYDROXIDE-SIMETH 200-200-20 MG/5ML PO SUSP: 30.0000 mL | Freq: Once | ORAL | Status: DC

## 2023-11-18 MED ORDER — ASPIRIN 81 MG PO CHEW
81.0000 mg | CHEWABLE_TABLET | ORAL | Status: AC
  Administered 2023-11-19: 81 mg via ORAL
  Filled 2023-11-18: qty 1

## 2023-11-18 MED ORDER — SODIUM CHLORIDE 0.9 % IV SOLN: INTRAVENOUS | Status: DC

## 2023-11-18 NOTE — Consult Note (Signed)
 Cardiology Consultation   Patient ID: LAUREANO HETZER MRN: [id number]; DOB: [birth year]  Admit date: 11/18/2023 Date of Consult: 11/18/2023  PCP:  Ofilia Lamar LITTIE, MD   [facility] Providers Cardiologist:  Redell Leiter, MD        Patient Profile: NEWTON FRUTIGER is a 83 y.o. 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  who is being seen 11/18/2023 for the evaluation of chest pain at the request of Fairy Gravely MD.  History of Present Illness: Mr. Sassano has an extensive CAD history with CABG in 1997. He has done well with medical therapy with no further interventions. He was last seen by Delon Hoover NP 09/2023. At that time he was doing well an asymptomatic from a cardiac standpoint. Patient was to have CT imaging to follow-up on hiss ascending aorta aneurysm. He was also offered further lipid management, which patient declined.  Outpatient CT chest showed unchanged TAA 43cm. Extensive 3 vessel coronary artery calcifications. Emphysema.   Presented to the ED today for epigastric pain so intense he almost passed out. In EMS he was given ASA load and 1 SL nitroglycerin with improvement in pain. Patient reported associated nausea.  BP: 124/70   HR 69 ECG: sinus rhythm with 1st degree AV block, TWI in III, avF [seen on prior] VR 64 CXR shows atelectasis vs scarring of left lung base CTA C/A/P: stable TAA, no evidence of dissection or PE. Renal artery stenosis [50-70%], and aortic atherosclerosis. Renal calculus.  Pertinent lab work:  Cr 1.4 [up from baseline] otherwise unremarkable CMP and CBC Troponin 114 ->502  During interview patient was getting placed on IV heparin.  On interview, patient shared that he has been having ongoing epigastric pain for over a month.  He notes that  the pain is normally worse after eating.  Has been taking tagamet which has been helping previously.  Denied chest pain and anginal symptoms.  Today after he had cereal with banana had intense epigastric pain described as a burning.  He took 4 of his GERD medication pills.  He then called EMS.  They gave him an aspirin  load and sublingual nitroglycerin, he is unsure which medication improved his symptoms.  He has a history of a hernia.  Has noted he has been belching more often. Patient denied change in his shortness of breath at baseline.  Denied orthopnea, PND, peripheral edema.  Denied palpitations.  Again denied chest pain. He did take his morning medications   Past Medical History:  Diagnosis Date   Acquired hallux rigidus of right foot 10/09/2020   Acquired hypothyroidism 03/13/2015   Acute bronchitis due to other specified organisms 04/22/2016   Acute idiopathic gout of left foot 11/02/2019   Acute respiratory failure with hypoxia (HCC) 01/04/2022   Postoperative.  Will recommend IS and will wean from oxygen.     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 MPS 08/11/2010 with EF 56% and mild apical and septal iscjhemia 2. Cath 03/17/2011 with CTO of LAD and  RCA, LTA is patent to D, SVG is patent to the RCA, SVG to LAD is chronically occluded, EF 58%   Chronic cough 06/23/2016   Overview:  Onset 2015.  CT chest mild changes.    Last Assessment & Plan:  Here for evaluation of his upper airway from pulmonology due to chronic cough. Recently had his PPI increased to twice a day and was started on an inhaler.  He has noticed improvement in his cough, but it  still bothersome.  No ACE inhibitor.  Cough seems to be much worse in the spring. EXAM by indirect laryngoscopy shows normal true vocal cords move well on phonation.  3+ edema of the arytenoid. PLAN: Reassured his larynx looks okay.  Mild arytenoid edema suggestive of reflux-induced irritation.  I agree with everything that has been done.  Let us  see how he does with his new inhaler twice a day PPI therapy for the month of June.  If he still persists with a bothersome cough, he will call to set up allergy testing.  Call sooner if symptoms are worsening.   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 2015: onset Formatting of this note might be different from the original. 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 11/18/2021 : left post     Neuralgia 10/30/2020   Formatting of this note might be different from the original. 10/30/2020: left preauricular Formatting of this note might be different from the original. 10/30/2020: left preauricular     Plantar fasciitis of left foot 06/06/2020   Pneumonitis 06/10/2016   Overview:  2018   Prediabetes 11/18/2021   Formatting of  this note might be different from the original. 09/2021: 121/5.8 Formatting of this note might be different from the original. 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 06/14/2020 : right 5th toe Formatting of this note might be different from the original. 06/14/2020 : right 5th toe     Solitary pulmonary nodule 06/11/2016   Overview:  09/10/16: CT 9 mm LLL, stable on f/u - recheck February 2019   Thyroid disease    Toenail fungus 05/18/2017   Formatting of this note might be different from the original. 2019: Formatting of this note might be different from the original. 2019:  Trigeminal neuralgia 03/29/2016   Tumor of soft tissue of neck 02/11/2022    Past Surgical History:  Procedure Laterality Date   CHOLECYSTECTOMY     CORONARY ANGIOPLASTY WITH STENT PLACEMENT     CORONARY ARTERY BYPASS GRAFT  x 5   TOTAL HIP ARTHROPLASTY Right 01/04/2022   Procedure: TOTAL HIP ARTHROPLASTY ANTERIOR APPROACH;  Surgeon: Ernie Cough, MD;  Location: [facility];  Service: Orthopedics;  Laterality: Right;   TOTAL KNEE ARTHROPLASTY Left    TOTAL SHOULDER REPLACEMENT Right        Scheduled Meds:  Continuous Infusions:  PRN Meds:   Allergies:    Allergies  Allergen Reactions   Ace Inhibitors Other (See Comments)    Muscle aches, cough  Product containing angiotensin-converting enzyme inhibitor (product)   Rosuvastatin     Other reaction(s): Muscle pain   Statins Other (See Comments)    Felt bad    Social History:   Social History   Socioeconomic History   Marital status: Married    Spouse name: Not on file   Number of children:  Not on file   Years of education: Not on file   Highest education level: Not on file  Occupational History   Not on file  Tobacco Use   Smoking status: Former    Current packs/day: 0.00    Types: Cigarettes    Quit date: 12/05/1979    Years since quitting: 43.9   Smokeless tobacco: Never  Vaping Use   Vaping status: Never Used  Substance and Sexual Activity   Alcohol use: No   Drug use: No   Sexual activity: Not on file  Other Topics Concern   Not on file  Social History Narrative   Not on file   Social Drivers of Health   Financial Resource Strain: Not on file  Food Insecurity: Low Risk  (11/26/2022)   Received from [facility]   Hunger Vital Sign    Within the past 12 months, you worried that your food would run out before you got money to buy more: Never true    Within the past 12 months, the food you bought just didn't last and you didn't have money to get more. : Never true  Transportation Needs: No Transportation Needs (11/26/2022)   Received from [facility]    In the past 12 months, has lack of reliable transportation kept you from medical appointments, meetings, work or from getting things needed for daily living? : No  Physical Activity: Not on file  Stress: Not on file  Social Connections: Not on file  Intimate Partner Violence: Not At Risk (01/03/2022)   Humiliation, Afraid, Rape, and Kick questionnaire    Fear of Current or Ex-Partner: No    Emotionally Abused: No    Physically Abused: No    Sexually Abused: No    Family History:   Family History  Problem Relation Age of Onset   Thyroid cancer Sister    Asthma Sister    CAD Mother    Stroke Father      ROS:  Please see the history of present illness.  All other ROS reviewed and negative.     Physical Exam/Data: Vitals:   11/18/23 1257 11/18/23 1415 11/18/23 1430 11/18/23 1445  BP: 124/70 121/74 117/73 120/72  Pulse: 69 64 64 63  Resp: 18 (!) 28 (!) 24 (!) 21  SpO2: 98%  100% 100% 100%   No intake or output data in the 24  hours ending 11/18/23 1608    10/12/2023   10:26 AM 04/13/2023   10:21 AM 03/27/2022    9:24 AM  Last 3 Weights  Weight (lbs) 183 lb 3.2 oz 185 lb 189 lb  Weight (kg) 83.099 kg 83.915 kg 85.73 kg     There is no height or weight on file to calculate BMI.  General: Older gentleman in no acute distress HEENT: normal Neck: no JVD Vascular: Distal pulses 2+ bilaterally Cardiac:  normal S1, S2; RRR; no murmur  Lungs:  clear to auscultation bilaterally, no wheezing, rhonchi or rales.  Chest wall not tender to palpation. Abd: soft, tender to epigastrium with palpation Ext: no edema Musculoskeletal:  No deformities, BUE and BLE strength normal and equal Skin: warm and dry  Neuro:  CNs 2-12 intact, no focal abnormalities noted Psych:  Normal affect   EKG:  The EKG was personally reviewed and demonstrates:  see hpi Telemetry:  Telemetry was personally reviewed and demonstrates: Sinus rhythm with T WI.  First-degree AV block.  Multiple 1.5-second pauses.  HR 65  Relevant CV Studies: Echocardiogram 07/2019 IMPRESSIONS     1. Left ventricular ejection fraction, by estimation, is 50 to 55%. The  left ventricle has low normal function. The left ventricle has no regional  wall motion abnormalities. Left ventricular diastolic parameters are  consistent with Grade I diastolic  dysfunction (impaired relaxation).   2. Right ventricular systolic function is normal. The right ventricular  size is normal. There is normal pulmonary artery systolic pressure.   3. The mitral valve is normal in structure. No evidence of mitral valve  regurgitation. No evidence of mitral stenosis.   4. The aortic valve is normal in structure. Aortic valve regurgitation is  not visualized. No aortic stenosis is present.   5. There is moderate dilatation of the ascending aorta measuring 43 mm.   6. The inferior vena cava is normal in size with greater than 50%   respiratory variability, suggesting right atrial pressure of 3 mmHg.   Laboratory Data: High Sensitivity Troponin:   Recent Labs  Lab 11/18/23 1311  TROPONINIHS 114*     Chemistry Recent Labs  Lab 11/18/23 1311  NA 136  137  K 4.4  4.4  CL 101  102  CO2 20*  23  GLUCOSE 125*  127*  BUN 22  22  CREATININE 1.43*  1.40*  CALCIUM 9.0  9.0  GFRNONAA 48*  50*  ANIONGAP 15  12    Recent Labs  Lab 11/18/23 1311  PROT 7.0  ALBUMIN 3.8  AST 27  ALT 16  ALKPHOS 43  BILITOT 1.1   Hematology Recent Labs  Lab 11/18/23 1311  WBC 7.8  RBC 3.99*  HGB 13.8  HCT 40.2  MCV 100.8*  MCH 34.6*  MCHC 34.3  RDW 13.6  PLT 148*    Radiology/Studies:  CT Angio Chest/Abd/Pel for Dissection W and/or Wo Contrast Result Date: 11/18/2023 CLINICAL DATA:  Chest pain, abdominal pain EXAM: CT ANGIOGRAPHY CHEST, ABDOMEN AND PELVIS TECHNIQUE: Non-contrast CT of the chest was initially obtained. Multidetector CT imaging through the chest, abdomen and pelvis was performed using the standard protocol during bolus administration of intravenous contrast. Multiplanar reconstructed images and MIPs were obtained and reviewed to evaluate the vascular anatomy. RADIATION DOSE REDUCTION: This exam was performed according to the departmental dose-optimization program which includes automated exposure control, adjustment of the mA and/or kV according to patient size and/or use of iterative reconstruction technique. CONTRAST:  75mL  OMNIPAQUE IOHEXOL 350 MG/ML SOLN COMPARISON:  10/29/2023 FINDINGS: CTA CHEST FINDINGS Cardiovascular: 4.1 cm ascending thoracic aortic aneurysm. No evidence of thoracic aortic dissection. There is diffuse aortic atherosclerosis. Great vessels are widely patent. The heart is unremarkable without pericardial effusion. There is extensive atherosclerosis of the coronary vasculature. Postsurgical changes from prior CABG. There is technically adequate opacification of the central  pulmonary vasculature. No evidence of central or segmental pulmonary emboli. Mediastinum/Nodes: No enlarged mediastinal, hilar, or axillary lymph nodes. Thyroid gland, trachea, and esophagus demonstrate no significant findings. Lungs/Pleura: No acute airspace disease, effusion, or pneumothorax. Upper lobe predominant emphysema. Stable bibasilar scarring. Stable 7 mm left lower lobe nodule image 143/8, benign given long-term stability since at least 2020. No specific follow-up recommended. Central airways are patent. Musculoskeletal: No acute or destructive bony abnormalities. Right shoulder arthroplasty. Reconstructed images demonstrate no additional findings. Review of the MIP images confirms the above findings. CTA ABDOMEN AND PELVIS FINDINGS VASCULAR Aorta: Normal caliber aorta without aneurysm, dissection, vasculitis or significant stenosis. Diffuse atherosclerosis. Celiac: Patent without evidence of aneurysm, dissection, vasculitis or significant stenosis. SMA: Patent without evidence of aneurysm, dissection, vasculitis or significant stenosis. Renals: There is significant atherosclerosis at the origin of the bilateral renal arteries, right greater than left. On the right, estimated stenosis 50-70%, and on the left estimated less than 50%. No aneurysm, dissection, vasculitis, or fibromuscular dysplasia. IMA: Patent without evidence of aneurysm, dissection, vasculitis or significant stenosis. Inflow: Patent without evidence of aneurysm, dissection, vasculitis or significant stenosis. Veins: No obvious venous abnormality within the limitations of this arterial phase study. Review of the MIP images confirms the above findings. NON-VASCULAR Hepatobiliary: No focal liver abnormality is seen. Status post cholecystectomy. No biliary dilatation. Pancreas: Unremarkable. No pancreatic ductal dilatation or surrounding inflammatory changes. Spleen: Normal in size without focal abnormality. Adrenals/Urinary Tract: There is  a 4 mm nonobstructing calculus at the right UVJ. There are 3 additional punctate less than 3 mm nonobstructing right renal calculi. No left-sided calculi or obstruction. Mild bilateral renal cortical thinning. The adrenals and bladder are unremarkable. Stomach/Bowel: No bowel obstruction or ileus. No bowel wall thickening or inflammatory change. Lymphatic: No pathologic adenopathy. Reproductive: Prostate is unremarkable. Other: No free fluid or free intraperitoneal gas. No abdominal wall hernia. Musculoskeletal: Right hip arthroplasty. No acute or destructive bony abnormalities. Reconstructed images demonstrate no additional findings. Review of the MIP images confirms the above findings. IMPRESSION: Vascular: 1. 4.1 cm ascending thoracic aortic aneurysm. Recommend annual imaging followup by CTA or MRA. This recommendation follows 2010 ACCF/AHA/AATS/ACR/ASA/SCA/SCAI/SIR/STS/SVM Guidelines for the Diagnosis and Management of Patients with Thoracic Aortic Disease. Circulation. 2010; 121: [id number]. Aortic aneurysm NOS (ICD10-I71.9) 2. No evidence of aortic dissection. 3. No evidence of pulmonary embolus. 4. 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 5.  Aortic Atherosclerosis (ICD10-I70.0). Nonvascular: 1. Nonobstructing 4 mm right UVJ calculus. Other punctate nonobstructing right renal calculi as above. 2.  Emphysema (ICD10-J43.9). Electronically Signed   By: Ozell Daring M.D.   On: 11/18/2023 15:14   DG Chest 2 View Result Date: 11/18/2023 EXAM: 2 VIEW(S) XRAY OF THE CHEST 11/18/2023 01:28:00 PM COMPARISON: 01/03/2022. CLINICAL HISTORY: Chest pain. FINDINGS: LUNGS AND PLEURA: Subsegmental linear atelectasis vs scarring at the left lung base. No focal pulmonary opacity. No overt pulmonary edema. No pleural effusion. No pneumothorax. HEART AND MEDIASTINUM: No acute abnormality of the cardiac and mediastinal silhouettes. Aortic atherosclerosis. Prior median sternotomy and CABG. BONES AND  SOFT TISSUES: Right reverse shoulder arthroplasty. Diffuse osseous demineralization.  IMPRESSION: 1. Subsegmental linear atelectasis vs scarring at the left lung base. Otherwise, no acute cardiopulmonary findings. 2. Aortic atherosclerosis. Electronically signed by: Shahmeer Marcelino MD 11/18/2023 02:01 PM EDT RP Workstation: [id number]     Assessment and Plan: NSTEMI CAD s/p CABG in 1997 AKI [Cr 1.4] Patient presenting with epigastric pain with associated nausea. Was given ASA load and 1 SL nitroglycerin with positive effect on pain though had also just taken GERD medication as well.  ECG unchanged from previous Troponin 114->502.  Continue to trend troponin Echocardiogram pending  Patient symptomology is atypical as he is having epigastric pain worse with palpation and after eating.  He does have a history of GERD with erosive esophagitis. Will give GI cocktail.  While he could be having a GERD episode, given his troponin trend and his coronary artery history would like to pursue ischemic evaluation.  He has not had an ischemic evaluation since 1997 based on chart review, and it is likely that by now one of his vein grafts has gone down. Additionally he has not been cholesterol management.  Unlikely to be due to mesenteric angina given CTA results.   Will pursue cardiac catheterization tomorrow. With his AKI and CT contrast administration today, will following closely and give IV fluids tomorrow. NPO at midnight.   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  On exam appears euvolemic Denied HF symptoms. Will order BNP.  Patient has an allergy to ACEi  Will hold his home furosemide , will give diuresis as needed Coreg  as above  Dyslipidemia Not on cholesterol lowering medications 2/2 intolerance Patient declined further testing or intervention at most recent outpatient visit Lipid panel  and lipoprotein pending Will continue to discuss cholesterol-lowering medications with patient this admission.  Hypertension BP: 124/70 Medication as above  TAA Stable on CT, continue to monitor outpatient  Per primary AKI Essential tremor Emphysema GERD Hypothyroidism Glaucoma Neuralgia  Risk Assessment/Risk Scores:    TIMI Risk Score for Unstable Angina or Non-ST Elevation MI:   The patient's TIMI risk score is 5, which indicates a 26% risk of all cause mortality, new or recurrent myocardial infarction or need for urgent revascularization in the next 14 days.  [place]  Heart Association (NYHA) Functional Class NYHA Class II    For questions or updates, please contact [facility] Please consult [email or web address] for contact info under      Signed, Leontine LOISE Salen, PA-C  11/18/2023 4:08 PM

## 2023-11-18 NOTE — Progress Notes (Signed)
   11/18/23 2116  Vitals  Temp 97.7 F (36.5 C)  Temp Source Oral  BP 121/77  MAP (mmHg) 90  BP Location Left Arm  BP Method Automatic  Patient Position (if appropriate) Sitting  Pulse Rate 74  ECG Heart Rate 76  Resp 19  Level of Consciousness  Level of Consciousness Alert  MEWS COLOR  MEWS Score Color Green  Oxygen Therapy  SpO2 99 %  O2 Device Room Air  Pain Assessment  Pain Scale 0-10  Pain Score 0  Height and Weight  Height 6' 2 (1.88 m)  Weight 79.7 kg  BSA (Calculated - sq m) 2.04 sq meters  BMI (Calculated) 22.55  Weight in (lb) to have BMI = 25 194.3  MEWS Score  MEWS Temp 0  MEWS Systolic 0  MEWS Pulse 0  MEWS RR 0  MEWS LOC 0  MEWS Score 0   Admitted patient to rm 3E29 from ED, pt alert and oriented x4, denies pain at this time, oriented to room, call bell placed within reach. Placed on cardiac monitor CCMD made aware.

## 2023-11-18 NOTE — H&P (Addendum)
 History and Physical   AWS SHERE FMW:[id number] DOB: [birth year] DOA: 11/18/2023  PCP: Ofilia Lamar LITTIE, MD   Patient coming from: Home  Chief Complaint: Epigastric abdominal pain  HPI: Jacob Weber is a 83 y.o. male with medical history significant of hypertension, hyperlipidemia, GERD, hypothyroidism, CAD status post CABG, ascending aortic aneurysm, chronic combined CHF, essential tremor, glaucoma, emphysema presenting with epigastric abdominal pain.  Patient reports eating a banana and taking potassium this morning and developing epigastric pain shortly after.  Reports it felt like he was going to pass out.  Did take a aspirin  and nitroglycerin with improvement in pain but time patient arrived to the ED.  Denies fevers, chills, constipation, diarrhea, nausea, vomiting.  ED Course: Vital signs in the ED stable.  Lab workup notable for CMP with bicarb 20, creatinine 1.4 from baseline of 1.1-1.2, glucose 125.  CBC with platelets 148.  Troponin 114, repeat pending.  Lipase normal.  Chest x-ray with linear atelectasis versus scarring at the left lower lobe.  CTA chest abdomen pelvis showed stable 4.1 cm ascending aortic aneurysm.  No evidence of dissection or PE.  Noted 50-70% renal artery stenosis.  Cardiology consulted in the setting of known CAD with recent CT scan showing three-vessel calcification.  Review of Systems: As per HPI otherwise all other systems reviewed and are negative.  Past Medical History:  Diagnosis Date   Acquired hallux rigidus of right foot 10/09/2020   Acquired hypothyroidism 03/13/2015   Acute bronchitis due to other specified organisms 04/22/2016   Acute idiopathic gout of left foot 11/02/2019   Acute respiratory failure with hypoxia (HCC) 01/04/2022   Postoperative.  Will recommend IS and will wean from oxygen.     Angina pectoris 03/29/2016   Ascending aortic aneurysm 06/11/2016   Overview:  2018: CT 4.2 cm   Bilateral hearing loss 03/27/2022    Capsulitis of metatarsophalangeal (MTP) joint of right foot 12/02/2017   Cardiomyopathy, secondary (HCC) 12/04/2014   Chronic combined systolic and diastolic heart failure (HCC) 12/04/2014   Chronic coronary artery disease 12/04/2014   Overview:  1. MPS 08/11/2010 with EF 56% and mild apical and septal iscjhemia 2. Cath 03/17/2011 with CTO of LAD and RCA, LTA is patent to D, SVG is patent to the RCA, SVG to LAD is chronically occluded, EF 58%   Chronic cough 06/23/2016   Overview:  Onset 2015.  CT chest mild changes.    Last Assessment & Plan:  Here for evaluation of his upper airway from pulmonology due to chronic cough. Recently had his PPI increased to twice a day and was started on an inhaler.  He has noticed improvement in his cough, but it still bothersome.  No ACE inhibitor.  Cough seems to be much worse in the spring. EXAM by indirect laryngoscopy shows normal true vocal cords move well on phonation.  3+ edema of the arytenoid. PLAN: Reassured his larynx looks okay.  Mild arytenoid edema suggestive of reflux-induced irritation.  I agree with everything that has been done.  Let us  see how he does with his new inhaler twice a day PPI therapy for the month of June.  If he still persists with a bothersome cough, he will call to set up allergy testing.  Call sooner if symptoms are worsening.   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 2015: onset Formatting of this  note might be different from the original. 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 11/18/2021 : left post     Neuralgia 10/30/2020   Formatting of this note might be different from the original. 10/30/2020: left preauricular Formatting of this note might be different from the original. 10/30/2020: left preauricular     Plantar fasciitis of left foot 06/06/2020   Pneumonitis 06/10/2016   Overview:  2018   Prediabetes 11/18/2021   Formatting of this note might be different from the original. 09/2021: 121/5.8 Formatting of this note might be different from the original. 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 06/14/2020 : right 5th toe Formatting of this note might be different from the original. 06/14/2020 : right 5th toe     Solitary pulmonary nodule 06/11/2016   Overview:  09/10/16: CT 9 mm LLL, stable on f/u - recheck February 2019   Thyroid disease    Toenail fungus 05/18/2017   Formatting of this note might be different from the original. 2019: Formatting of this note might be different from the original. 2019:     Trigeminal neuralgia 03/29/2016   Tumor of soft tissue of neck 02/11/2022    Past Surgical History:  Procedure Laterality Date   CHOLECYSTECTOMY     CORONARY ANGIOPLASTY WITH STENT PLACEMENT     CORONARY ARTERY BYPASS  GRAFT  x 5   TOTAL HIP ARTHROPLASTY Right 01/04/2022   Procedure: TOTAL HIP ARTHROPLASTY ANTERIOR APPROACH;  Surgeon: Ernie Cough, MD;  Location: [facility];  Service: Orthopedics;  Laterality: Right;   TOTAL KNEE ARTHROPLASTY Left    TOTAL SHOULDER REPLACEMENT Right     Social History  reports that he quit smoking about 43 years ago. His smoking use included cigarettes. He has never used smokeless tobacco. He reports that he does not drink alcohol and does not use drugs.  Allergies  Allergen Reactions   Ace Inhibitors Other (See Comments)    Muscle aches, cough  Product containing angiotensin-converting enzyme inhibitor (product)   Rosuvastatin     Other reaction(s): Muscle pain   Statins Other (See Comments)    Felt bad    Family History  Problem Relation Age of Onset   Thyroid cancer Sister    Asthma Sister    CAD Mother    Stroke Father   Reviewed on admission  Prior to Admission medications  Medication Sig Start Date End Date Taking? Authorizing Provider  allopurinol  (ZYLOPRIM ) 100 MG tablet Take 100 mg by mouth at bedtime. 02/26/20   [provider]  allopurinol  (ZYLOPRIM ) 300 MG tablet Take 300 mg by mouth in the morning. 12/31/20   [provider]  aspirin  81 MG chewable tablet Chew 81 mg by mouth daily. 02/28/15   [provider]  bimatoprost (LUMIGAN) 0.03 % ophthalmic solution Place 1 drop into both eyes at bedtime. 04/07/19   [provider]  carvedilol  (COREG ) 25 MG tablet Take 0.5 tablets (12.5 mg total) by mouth 2 (two) times daily. 09/13/23   Carlin Delon BROCKS, NP  colchicine 0.6 MG tablet Take 0.6 mg by mouth 2 (two) times daily as needed. Gout 02/22/21   [provider]  Cyanocobalamin (VITAMIN B 12 PO) Take 1 tablet by mouth daily.    [provider]  dicyclomine (BENTYL) 10 MG capsule take 1 capsule by mouth four times a day if needed for INTESTINAL SPASMS 03/02/16   [provider]  fluticasone (FLONASE)  50 MCG/ACT nasal spray Place 1 spray into both nostrils daily. 08/06/22 10/12/23  [provider]  furosemide  (LASIX ) 40 MG tablet TAKE ONE TABLET BY MOUTH ONCE DAILY 09/30/23   Monetta Redell PARAS, MD  levothyroxine  (SYNTHROID , LEVOTHROID) 100 MCG tablet Take 100 mcg by mouth daily.    [provider]  mometasone (ELOCON) 0.1 % cream Apply 1 Application topically daily. 07/20/22   [provider]  nitroGLYCERIN (NITROSTAT) 0.4 MG SL tablet Place 0.4 mg under the tongue every 5 (five) minutes as needed for chest pain.    [provider]  pantoprazole  (PROTONIX ) 40 MG tablet Take 40 mg by mouth daily as needed (acid reflux).    [provider]  polyethylene glycol (MIRALAX  / GLYCOLAX ) 17 g packet Take 17 g by mouth 2 (two) times daily. 01/06/22   Patti Rosina SAUNDERS, PA-C  potassium chloride  SA (KLOR-CON  M) 20 MEQ tablet TAKE ONE TABLET BY MOUTH ONCE DAILY 09/30/23   Monetta Redell PARAS, MD    Physical Exam: Vitals:   11/18/23 1257 11/18/23 1415 11/18/23 1430 11/18/23 1445  BP: 124/70 121/74 117/73 120/72  Pulse: 69 64 64 63  Resp: 18 (!) 28 (!) 24 (!) 21  SpO2: 98% 100% 100% 100%    Physical Exam Constitutional:      General: He is not in acute distress.    Appearance: Normal appearance.  HENT:     Head: Normocephalic and atraumatic.     Mouth/Throat:     Mouth: Mucous membranes are moist.     Pharynx: Oropharynx is clear.  Eyes:     Extraocular Movements: Extraocular movements intact.     Pupils: Pupils are equal, round, and reactive to light.  Cardiovascular:     Rate and Rhythm: Normal rate and regular rhythm.     Pulses: Normal pulses.     Heart sounds: Normal heart sounds.  Pulmonary:     Effort: Pulmonary effort is normal. No respiratory distress.     Breath sounds: Normal breath sounds.  Abdominal:     General: Bowel sounds are normal. There is no distension.     Palpations: Abdomen is soft.     Tenderness: There is no abdominal tenderness  (mild epigastric).  Musculoskeletal:        General: No swelling or deformity.  Skin:    General: Skin is warm and dry.  Neurological:     General: No focal  deficit present.     Mental Status: Mental status is at baseline.    Labs on Admission: I have personally reviewed following labs and imaging studies  CBC: Recent Labs  Lab 11/18/23 1311  WBC 7.8  HGB 13.8  HCT 40.2  MCV 100.8*  PLT 148*    Basic Metabolic Panel: Recent Labs  Lab 11/18/23 1311  NA 136  137  K 4.4  4.4  CL 101  102  CO2 20*  23  GLUCOSE 125*  127*  BUN 22  22  CREATININE 1.43*  1.40*  CALCIUM 9.0  9.0    GFR: CrCl cannot be calculated (Unknown ideal weight.).  Liver Function Tests: Recent Labs  Lab 11/18/23 1311  AST 27  ALT 16  ALKPHOS 43  BILITOT 1.1  PROT 7.0  ALBUMIN 3.8    Urine analysis:    Component Value Date/Time   COLORURINE YELLOW 01/04/2022 0145   APPEARANCEUR CLEAR 01/04/2022 0145   LABSPEC 1.014 01/04/2022 0145   PHURINE 5.0 01/04/2022 0145   GLUCOSEU NEGATIVE 01/04/2022 0145   HGBUR MODERATE (A) 01/04/2022 0145   BILIRUBINUR NEGATIVE 01/04/2022 0145   KETONESUR NEGATIVE 01/04/2022 0145   PROTEINUR NEGATIVE 01/04/2022 0145   UROBILINOGEN 0.2 02/07/2009 1043   NITRITE NEGATIVE 01/04/2022 0145   LEUKOCYTESUR NEGATIVE 01/04/2022 0145    Radiological Exams on Admission: CT Angio Chest/Abd/Pel for Dissection W and/or Wo Contrast Result Date: 11/18/2023 CLINICAL DATA:  Chest pain, abdominal pain EXAM: CT ANGIOGRAPHY CHEST, ABDOMEN AND PELVIS TECHNIQUE: Non-contrast CT of the chest was initially obtained. Multidetector CT imaging through the chest, abdomen and pelvis was performed using the standard protocol during bolus administration of intravenous contrast. Multiplanar reconstructed images and MIPs were obtained and reviewed to evaluate the vascular anatomy. RADIATION DOSE REDUCTION: This exam was performed according to the departmental dose-optimization  program which includes automated exposure control, adjustment of the mA and/or kV according to patient size and/or use of iterative reconstruction technique. CONTRAST:  75mL OMNIPAQUE IOHEXOL 350 MG/ML SOLN COMPARISON:  10/29/2023 FINDINGS: CTA CHEST FINDINGS Cardiovascular: 4.1 cm ascending thoracic aortic aneurysm. No evidence of thoracic aortic dissection. There is diffuse aortic atherosclerosis. Great vessels are widely patent. The heart is unremarkable without pericardial effusion. There is extensive atherosclerosis of the coronary vasculature. Postsurgical changes from prior CABG. There is technically adequate opacification of the central pulmonary vasculature. No evidence of central or segmental pulmonary emboli. Mediastinum/Nodes: No enlarged mediastinal, hilar, or axillary lymph nodes. Thyroid gland, trachea, and esophagus demonstrate no significant findings. Lungs/Pleura: No acute airspace disease, effusion, or pneumothorax. Upper lobe predominant emphysema. Stable bibasilar scarring. Stable 7 mm left lower lobe nodule image 143/8, benign given long-term stability since at least 2020. No specific follow-up recommended. Central airways are patent. Musculoskeletal: No acute or destructive bony abnormalities. Right shoulder arthroplasty. Reconstructed images demonstrate no additional findings. Review of the MIP images confirms the above findings. CTA ABDOMEN AND PELVIS FINDINGS VASCULAR Aorta: Normal caliber aorta without aneurysm, dissection, vasculitis or significant stenosis. Diffuse atherosclerosis. Celiac: Patent without evidence of aneurysm, dissection, vasculitis or significant stenosis. SMA: Patent without evidence of aneurysm, dissection, vasculitis or significant stenosis. Renals: There is significant atherosclerosis at the origin of the bilateral renal arteries, right greater than left. On the right, estimated stenosis 50-70%, and on the left estimated less than 50%. No aneurysm, dissection,  vasculitis, or fibromuscular dysplasia. IMA: Patent without evidence of aneurysm, dissection, vasculitis or significant stenosis. Inflow: Patent without evidence of aneurysm, dissection, vasculitis or significant stenosis. Veins:  No obvious venous abnormality within the limitations of this arterial phase study. Review of the MIP images confirms the above findings. NON-VASCULAR Hepatobiliary: No focal liver abnormality is seen. Status post cholecystectomy. No biliary dilatation. Pancreas: Unremarkable. No pancreatic ductal dilatation or surrounding inflammatory changes. Spleen: Normal in size without focal abnormality. Adrenals/Urinary Tract: There is a 4 mm nonobstructing calculus at the right UVJ. There are 3 additional punctate less than 3 mm nonobstructing right renal calculi. No left-sided calculi or obstruction. Mild bilateral renal cortical thinning. The adrenals and bladder are unremarkable. Stomach/Bowel: No bowel obstruction or ileus. No bowel wall thickening or inflammatory change. Lymphatic: No pathologic adenopathy. Reproductive: Prostate is unremarkable. Other: No free fluid or free intraperitoneal gas. No abdominal wall hernia. Musculoskeletal: Right hip arthroplasty. No acute or destructive bony abnormalities. Reconstructed images demonstrate no additional findings. Review of the MIP images confirms the above findings. IMPRESSION: Vascular: 1. 4.1 cm ascending thoracic aortic aneurysm. Recommend annual imaging followup by CTA or MRA. This recommendation follows 2010 ACCF/AHA/AATS/ACR/ASA/SCA/SCAI/SIR/STS/SVM Guidelines for the Diagnosis and Management of Patients with Thoracic Aortic Disease. Circulation. 2010; 121: Z733-z630. Aortic aneurysm NOS (ICD10-I71.9) 2. No evidence of aortic dissection. 3. No evidence of pulmonary embolus. 4. 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 5.  Aortic Atherosclerosis (ICD10-I70.0). Nonvascular: 1. Nonobstructing 4 mm right UVJ calculus.  Other punctate nonobstructing right renal calculi as above. 2.  Emphysema (ICD10-J43.9). Electronically Signed   By: Ozell Daring M.D.   On: 11/18/2023 15:14   DG Chest 2 View Result Date: 11/18/2023 EXAM: 2 VIEW(S) XRAY OF THE CHEST 11/18/2023 01:28:00 PM COMPARISON: 01/03/2022. CLINICAL HISTORY: Chest pain. FINDINGS: LUNGS AND PLEURA: Subsegmental linear atelectasis vs scarring at the left lung base. No focal pulmonary opacity. No overt pulmonary edema. No pleural effusion. No pneumothorax. HEART AND MEDIASTINUM: No acute abnormality of the cardiac and mediastinal silhouettes. Aortic atherosclerosis. Prior median sternotomy and CABG. BONES AND SOFT TISSUES: Right reverse shoulder arthroplasty. Diffuse osseous demineralization. IMPRESSION: 1. Subsegmental linear atelectasis vs scarring at the left lung base. Otherwise, no acute cardiopulmonary findings. 2. Aortic atherosclerosis. Electronically signed by: Harrietta Sherry MD 11/18/2023 02:01 PM EDT RP Workstation: [id number]   EKG: Independently reviewed.  Sinus rhythm at 64 bpm.  Nonspecific T wave changes.  Low voltage multiple leads.  Assessment/Plan Active Problems:   Coronary artery disease involving native coronary artery of native heart with angina pectoris   Chronic combined systolic and diastolic heart failure (HCC)   Ascending aortic aneurysm   Hypertension   Mixed hyperlipidemia   Acquired hypothyroidism   GERD (gastroesophageal reflux disease)   Emphysema of lung (HCC)   Essential tremor   Hyperlipidemia   Glaucoma   NSTEMI (non-ST elevated myocardial infarction) (HCC)   CAD NSTEMI > Patient presenting with epigastric pain that improved after taking aspirin  nitroglycerin.  Also noted to have troponin of 114 with repeat pending.  This is in the setting of known CAD with prior CABG and recent CT scan showing three-vessel calcification.  Concern for atypical angina/NSTEMI. > Not on atorvastatin due to intolerance > Cardiology  consulted in ED - Monitor on telemetry - Appreciate cardiology recommendations and assistance - Potential cath, defer to cardiology - Echocardiogram - Trend troponin - Heparin infusion - Continue home aspirin , Coreg   Missed beats > Telemetry showing missed 1-2 missed beats every 3-5 minutes. Likely associated with NSTEMI. - Continue to monitor - Check magnesium  Hypertension - Continue Lasix , Coreg   Hyperlipidemia - Not on statin as above  GERD - Continue PPI  Hypothyroidism - Continue Synthroid   Ascending aortic aneurysm - Stable on imaging  Chronic combined systolic and diastolic CHF > Echo in 2021 with EF 50 to 55%, G1 DD, normal RV function. - Echocardiogram as above - Continue home Lasix  and Coreg  as above  Glaucoma - Continue home eyedrops  DVT prophylaxis: Heparin Code Status:   Full Family Communication:  Updated at bedside Disposition Plan:   Patient is from:  Home  Anticipated DC to:  Home  Anticipated DC date:  1 to 2 days  Anticipated DC barriers: None  Consults called:  Neurology Admission status:  Observation, telemetry  Severity of Illness: The appropriate patient status for this patient is OBSERVATION. Observation status is judged to be reasonable and necessary in order to provide the required intensity of service to ensure the patient's safety. The patient's presenting symptoms, physical exam findings, and initial radiographic and laboratory data in the context of their medical condition is felt to place them at decreased risk for further clinical deterioration. Furthermore, it is anticipated that the patient will be medically stable for discharge from the hospital within 2 midnights of admission.    Marsa KATHEE Scurry MD [facility]  How to contact the [facility] Attending or Consulting provider 7A - 7P or covering provider during after hours 7P -7A, for this patient?   Check the care team in [facility] and look for a) attending/consulting [facility]  provider listed and b) the [facility] team listed Log into [email or web address] and use [facility]'s universal password to access. If you do not have the password, please contact the hospital operator. Locate the [facility] provider you are looking for under [facility] and page to a number that you can be directly reached. If you still have difficulty reaching the provider, please page the [facility] (Director on Call) for the Hospitalists listed on amion for assistance.  11/18/2023, 4:21 PM

## 2023-11-18 NOTE — ED Notes (Signed)
 Patient transported to X-ray

## 2023-11-18 NOTE — ED Provider Notes (Signed)
  Physical Exam  BP 120/72   Pulse 63   Resp (!) 21   SpO2 100%   Physical Exam  Procedures  .Critical Care  Performed by: Mannie Fairy DASEN, DO Authorized by: Mannie Fairy DASEN, DO   Critical care provider statement:    Critical care time (minutes):  33   Critical care was necessary to treat or prevent imminent or life-threatening deterioration of the following conditions:  Cardiac failure   Critical care was time spent personally by me on the following activities:  Development of treatment plan with patient or surrogate, discussions with consultants, evaluation of patient's response to treatment, examination of patient, ordering and review of laboratory studies, ordering and review of radiographic studies, ordering and performing treatments and interventions, pulse oximetry, re-evaluation of patient's condition and review of old charts   ED Course / MDM    Medical Decision Making Amount and/or Complexity of Data Reviewed Labs: ordered. Radiology: ordered.  Risk Prescription drug management. Decision regarding hospitalization.   I, Larnell Mannie, assumed care for this patient.  In brief 83 year old male here today for some epigastric pain that has since resolved.  Patient was signed out pending delta troponin, CTA of the patient's chest abdomen pelvis.  No dissection on CTA.  Patient's initial troponin elevated at 114.  I put in an order for heparin, cardiology consultation and admission for the patient.  I then went to tell the patient about his elevated troponin, my concern for NSTEMI.  Patient told me that he very much did not want to stay in the hospital, and was hoping that he could see cardiology and go home with outpatient follow-up.  I explained to the patient that my concern would be for injury to his heart but agreed to hold off on starting heparin until we had the patient's delta troponin as we did not have a baseline troponin on the patient.  When the patient's neck troponin  came back elevated at greater than 500, patient was agreeable to staying and starting heparin.       Mannie Fairy T, DO 11/18/23 HANNA

## 2023-11-18 NOTE — Progress Notes (Signed)
 PHARMACY - ANTICOAGULATION CONSULT NOTE  Pharmacy Consult for heparin Indication: chest pain/ACS  Allergies  Allergen Reactions   Ace Inhibitors Other (See Comments)    Muscle aches, cough  Product containing angiotensin-converting enzyme inhibitor (product)   Rosuvastatin     Other reaction(s): Muscle pain   Statins Other (See Comments)    Felt bad   Patient Measurements: Height: 6' 2 (188 cm) Weight: 84.9 kg (187 lb 2.7 oz) IBW/kg (Calculated) : 82.2 HEPARIN DW (KG): 84.9  Vital Signs: BP: 120/72 (10/23 1445) Pulse Rate: 63 (10/23 1445)  Labs: Recent Labs    11/18/23 1311 11/18/23 1504  HGB 13.8  --   HCT 40.2  --   PLT 148*  --   CREATININE 1.43*  1.40*  --   TROPONINIHS 114* 502*    Estimated Creatinine Clearance: 45.7 mL/min (A) (by C-G formula based on SCr of 1.4 mg/dL (H)).   Medical History: Past Medical History:  Diagnosis Date   Acquired hallux rigidus of right foot 10/09/2020   Acquired hypothyroidism 03/13/2015   Acute bronchitis due to other specified organisms 04/22/2016   Acute idiopathic gout of left foot 11/02/2019   Acute respiratory failure with hypoxia (HCC) 01/04/2022   Postoperative.  Will recommend IS and will wean from oxygen.     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 MPS 08/11/2010 with EF 56% and mild apical and septal iscjhemia 2. Cath 03/17/2011 with CTO of LAD and RCA, LTA is patent to D, SVG is patent to the RCA, SVG to LAD is chronically occluded, EF 58%   Chronic cough 06/23/2016   Overview:  Onset 2015.  CT chest mild changes.    Last Assessment & Plan:  Here for evaluation of his  upper airway from pulmonology due to chronic cough. Recently had his PPI increased to twice a day and was started on an inhaler.  He has noticed improvement in his cough, but it still bothersome.  No ACE inhibitor.  Cough seems to be much worse in the spring. EXAM by indirect laryngoscopy shows normal true vocal cords move well on phonation.  3+ edema of the arytenoid. PLAN: Reassured his larynx looks okay.  Mild arytenoid edema suggestive of reflux-induced irritation.  I agree with everything that has been done.  Let us  see how he does with his new inhaler twice a day PPI therapy for the month of June.  If he still persists with a bothersome cough, he will call to set up allergy testing.  Call sooner if symptoms are worsening.   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 2015: onset Formatting  of this note might be different from the original. 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 11/18/2021 : left post     Neuralgia 10/30/2020   Formatting of this note might be different from the original. 10/30/2020: left preauricular Formatting of this note might be different from  the original. 10/30/2020: left preauricular     Plantar fasciitis of left foot 06/06/2020   Pneumonitis 06/10/2016   Overview:  2018   Prediabetes 11/18/2021   Formatting of this note might be different from the original. 09/2021: 121/5.8 Formatting of this note might be different from the original. 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 06/14/2020 : right 5th toe Formatting of this note might be different from the original. 06/14/2020 : right 5th toe     Solitary pulmonary nodule 06/11/2016   Overview:  09/10/16: CT 9 mm LLL, stable on f/u - recheck February 2019   Thyroid disease    Toenail fungus 05/18/2017   Formatting of this note might be different from the original. 2019: Formatting of this note might be different from the original. 2019:     Trigeminal neuralgia 03/29/2016   Tumor of soft tissue of neck 02/11/2022    Medications:  Scheduled:   allopurinol   100 mg Oral QHS   [START ON 11/19/2023] allopurinol   300 mg Oral q AM   [START ON 11/19/2023] aspirin   81 mg Oral Daily   carvedilol   12.5 mg Oral BID   [START ON 11/19/2023] furosemide   40 mg Oral Daily   heparin  5,000 Units Subcutaneous Q8H   latanoprost  1 drop Both Eyes QHS   [START ON 11/19/2023] levothyroxine   100 mcg Oral Q0600   Assessment: Patient is an 83 YO M who presented to the ED with epigastric pain improved after taking 1 nitroglycerin. Troponins 114 > 502. Pharmacy consulted to dose heparin for ACS.   Patient not on anticoagulation PTA. Hgb normal and platelets low-normal at 148.   Goal of Therapy:  Heparin level 0.3-0.7 units/ml Monitor platelets  by anticoagulation protocol: Yes   Plan:  -Give heparin bolus of 4000 units. -Start heparin infusion at 1100 units/h.  -Check heparin level in 8h.  -Monitor heparin level, CBC daily  Maurilio Patten, PharmD PGY1 Pharmacy Resident [facility] 11/18/2023 4:36 PM

## 2023-11-18 NOTE — ED Triage Notes (Signed)
 Pt BIB [facility] from home with c/o of epigastric pain. Report pain so intense he felt like he was going to pass out. Took 324 aspirin  and 1 nitroglycerin prior to EMS arrival, pain improved 5/10. Pt now feeling nauseated.   [id number]

## 2023-11-18 NOTE — ED Notes (Signed)
 Following sent to hospitalist via secure chat:   FYI: Pt is having frequent missed beats, approx 1-2 missed beats every 3-5 minutes for the last hour according to cardiac monitoring review.   VSS. Pt denies new or worsening symptoms.

## 2023-11-18 NOTE — ED Provider Notes (Signed)
 [place] EMERGENCY DEPARTMENT AT [facility] Provider Note   CSN: [id number] Arrival date & time: 11/18/23  1251     Patient presents with: No chief complaint on file.   Jacob Weber is a 83 y.o. male.  {Add pertinent medical, surgical, social history, OB history to HPI:6359} 83 year old male presenting to the emergency department for evaluation of his sudden onset epigastric abdominal pain.  Patient reports that he ate a banana and took some potassium this morning.  Soon after he developed severe epigastric abdominal pain that made him feel as though he was going to pass out. Patient has a past medical history of HFpEF, an ascending aortic aneurysm, hypertension, and CAD with prior bypass in 1997.  He follows with Dr. Monetta his cardiologist and his last echo was in 2021 with a moderate dilation of his ascending aorta noted.  He does take Lasix  40 mg daily and Coreg  12.5 mg twice daily for his HFpEF. He states his abdominal pain has significantly improved since earlier and has almost resolved.  He denies any palpitations or shortness of breath.  He does report taking aspirin  324 and nitroglycerin prior to arrival.        Prior to Admission medications   Medication Sig Start Date End Date Taking? Authorizing Provider  allopurinol  (ZYLOPRIM ) 100 MG tablet Take 100 mg by mouth at bedtime. 02/26/20   [provider]  allopurinol  (ZYLOPRIM ) 300 MG tablet Take 300 mg by mouth in the morning. 12/31/20   [provider]  aspirin  81 MG chewable tablet Chew 81 mg by mouth daily. 02/28/15   [provider]  bimatoprost (LUMIGAN) 0.03 % ophthalmic solution Place 1 drop into both eyes at bedtime. 04/07/19   [provider]  carvedilol  (COREG ) 25 MG tablet Take 0.5 tablets (12.5 mg total) by mouth 2 (two) times daily. 09/13/23   Carlin Delon BROCKS, NP  colchicine 0.6 MG tablet Take 0.6 mg by mouth 2 (two) times daily as needed. Gout 02/22/21    [provider]  Cyanocobalamin (VITAMIN B 12 PO) Take 1 tablet by mouth daily.    [provider]  dicyclomine (BENTYL) 10 MG capsule take 1 capsule by mouth four times a day if needed for INTESTINAL SPASMS 03/02/16   [provider]  fluticasone (FLONASE) 50 MCG/ACT nasal spray Place 1 spray into both nostrils daily. 08/06/22 10/12/23  [provider]  furosemide  (LASIX ) 40 MG tablet TAKE ONE TABLET BY MOUTH ONCE DAILY 09/30/23   Monetta Redell PARAS, MD  levothyroxine  (SYNTHROID , LEVOTHROID) 100 MCG tablet Take 100 mcg by mouth daily.    [provider]  mometasone (ELOCON) 0.1 % cream Apply 1 Application topically daily. 07/20/22   [provider]  nitroGLYCERIN (NITROSTAT) 0.4 MG SL tablet Place 0.4 mg under the tongue every 5 (five) minutes as needed for chest pain.    [provider]  pantoprazole  (PROTONIX ) 40 MG tablet Take 40 mg by mouth daily as needed (acid reflux).    [provider]  polyethylene glycol (MIRALAX  / GLYCOLAX ) 17 g packet Take 17 g by mouth 2 (two) times daily. 01/06/22   Patti Rosina SAUNDERS, PA-C  potassium chloride  SA (KLOR-CON  M) 20 MEQ tablet TAKE ONE TABLET BY MOUTH ONCE DAILY 09/30/23   Monetta Redell PARAS, MD    Allergies: Ace inhibitors, Rosuvastatin, and Statins    Review of Systems  All other systems reviewed and are negative.   Updated Vital Signs BP 124/70 (BP Location:  Left Arm)   Pulse 69   Resp 18   SpO2 98%   Physical Exam Vitals and nursing note reviewed.  Constitutional:      General: He is not in acute distress. HENT:     Head: Atraumatic.     Mouth/Throat:     Mouth: Mucous membranes are moist.  Eyes:     Conjunctiva/sclera: Conjunctivae normal.  Cardiovascular:     Rate and Rhythm: Normal rate.     Pulses: Normal pulses.  Pulmonary:     Effort: Pulmonary effort is normal.  Abdominal:     General: Abdomen is flat.     Palpations: Abdomen is soft.  Musculoskeletal:      Cervical back: Neck supple.     Right lower leg: No edema.     Left lower leg: No edema.  Skin:    General: Skin is warm.  Neurological:     General: No focal deficit present.     Mental Status: He is alert.     (all labs ordered are listed, but only abnormal results are displayed) Labs Reviewed  BASIC METABOLIC PANEL WITH GFR  CBC  TROPONIN I (HIGH SENSITIVITY)    EKG: None  Radiology: No results found.  {Document cardiac monitor, telemetry assessment procedure when appropriate:32947} Procedures   Medications Ordered in the ED - No data to display    {Click here for ABCD2, HEART and other calculators REFRESH Note before signing:1}                              Medical Decision Making 83 year old male presenting to the emergency department for evaluation of his sudden onset epigastric discomfort.  Differential includes but limited to CAD, dissection, pancreatitis, gastric ulcer, and others. His symptoms have resolved since coming to the emergency department and his vitals have been stable. He is well-appearing here in the emergency department. Lab work did come back with an elevated troponin at 112.  We do not have any prior troponin levels to compare to.  Chest x-ray performed did not show any significant pleural effusions or cardiomegaly.  EKG was nonischemic. Hemoglobin is stable and his creatinine is slightly elevated from baseline at 1.4. We have ordered a CT aorta of the chest/abdomen/pelvis to rule out evidence of dissection.  Amount and/or Complexity of Data Reviewed Labs: ordered. Radiology: ordered. ECG/medicine tests:     Details: EKG showing sinus rhythm with prolonged PR interval and low voltage.  No ST segment abnormalities consistent with STEMI  Risk Prescription drug management.     Final diagnoses:  None    ED Discharge Orders     None

## 2023-11-18 NOTE — ED Notes (Signed)
 Dr. Seena notified of troponin rise to 1490.

## 2023-11-19 ENCOUNTER — Encounter (HOSPITAL_COMMUNITY)
Admission: EM | Disposition: A | Discharge status: Home / Self Care | Payer: Self-pay | Source: Home / Self Care | Attending: Internal Medicine

## 2023-11-19 ENCOUNTER — Observation Stay (HOSPITAL_COMMUNITY)

## 2023-11-19 DIAGNOSIS — Z7982 Long term (current) use of aspirin: Secondary | ICD-10-CM | POA: Diagnosis not present

## 2023-11-19 DIAGNOSIS — Z8249 Family history of ischemic heart disease and other diseases of the circulatory system: Secondary | ICD-10-CM | POA: Diagnosis not present

## 2023-11-19 DIAGNOSIS — H409 Unspecified glaucoma: Secondary | ICD-10-CM | POA: Diagnosis present

## 2023-11-19 DIAGNOSIS — I110 Hypertensive heart disease with heart failure: Secondary | ICD-10-CM | POA: Diagnosis present

## 2023-11-19 DIAGNOSIS — Z96652 Presence of left artificial knee joint: Secondary | ICD-10-CM | POA: Diagnosis present

## 2023-11-19 DIAGNOSIS — I440 Atrioventricular block, first degree: Secondary | ICD-10-CM | POA: Diagnosis present

## 2023-11-19 DIAGNOSIS — N179 Acute kidney failure, unspecified: Secondary | ICD-10-CM | POA: Diagnosis present

## 2023-11-19 DIAGNOSIS — I2510 Atherosclerotic heart disease of native coronary artery without angina pectoris: Secondary | ICD-10-CM | POA: Diagnosis present

## 2023-11-19 DIAGNOSIS — J439 Emphysema, unspecified: Secondary | ICD-10-CM | POA: Diagnosis present

## 2023-11-19 DIAGNOSIS — Z96611 Presence of right artificial shoulder joint: Secondary | ICD-10-CM | POA: Diagnosis present

## 2023-11-19 DIAGNOSIS — R079 Chest pain, unspecified: Secondary | ICD-10-CM | POA: Diagnosis not present

## 2023-11-19 DIAGNOSIS — K219 Gastro-esophageal reflux disease without esophagitis: Secondary | ICD-10-CM | POA: Diagnosis present

## 2023-11-19 DIAGNOSIS — E039 Hypothyroidism, unspecified: Secondary | ICD-10-CM | POA: Diagnosis present

## 2023-11-19 DIAGNOSIS — Z951 Presence of aortocoronary bypass graft: Secondary | ICD-10-CM

## 2023-11-19 DIAGNOSIS — I25810 Atherosclerosis of coronary artery bypass graft(s) without angina pectoris: Secondary | ICD-10-CM | POA: Diagnosis present

## 2023-11-19 DIAGNOSIS — Z96641 Presence of right artificial hip joint: Secondary | ICD-10-CM | POA: Diagnosis present

## 2023-11-19 DIAGNOSIS — Z87891 Personal history of nicotine dependence: Secondary | ICD-10-CM | POA: Diagnosis not present

## 2023-11-19 DIAGNOSIS — Z888 Allergy status to other drugs, medicaments and biological substances status: Secondary | ICD-10-CM | POA: Diagnosis not present

## 2023-11-19 DIAGNOSIS — I5042 Chronic combined systolic (congestive) and diastolic (congestive) heart failure: Secondary | ICD-10-CM | POA: Diagnosis present

## 2023-11-19 DIAGNOSIS — Z79899 Other long term (current) drug therapy: Secondary | ICD-10-CM | POA: Diagnosis not present

## 2023-11-19 DIAGNOSIS — E782 Mixed hyperlipidemia: Secondary | ICD-10-CM | POA: Diagnosis present

## 2023-11-19 DIAGNOSIS — I25110 Atherosclerotic heart disease of native coronary artery with unstable angina pectoris: Secondary | ICD-10-CM

## 2023-11-19 DIAGNOSIS — Z79890 Hormone replacement therapy (postmenopausal): Secondary | ICD-10-CM | POA: Diagnosis not present

## 2023-11-19 DIAGNOSIS — I7121 Aneurysm of the ascending aorta, without rupture: Secondary | ICD-10-CM | POA: Diagnosis present

## 2023-11-19 DIAGNOSIS — I429 Cardiomyopathy, unspecified: Secondary | ICD-10-CM | POA: Diagnosis present

## 2023-11-19 DIAGNOSIS — Z825 Family history of asthma and other chronic lower respiratory diseases: Secondary | ICD-10-CM | POA: Diagnosis not present

## 2023-11-19 DIAGNOSIS — I214 Non-ST elevation (NSTEMI) myocardial infarction: Secondary | ICD-10-CM | POA: Diagnosis present

## 2023-11-19 HISTORY — PX: CORONARY STENT INTERVENTION: CATH118234

## 2023-11-19 HISTORY — PX: CORONARY/GRAFT ANGIOGRAPHY: CATH118237

## 2023-11-19 LAB — ECHOCARDIOGRAM COMPLETE
AR max vel: 2.73 cm2
AV Area VTI: 3.11 cm2
AV Area mean vel: 2.78 cm2
AV Mean grad: 4 mmHg
AV Peak grad: 8.8 mmHg
Ao pk vel: 1.48 m/s
Area-P 1/2: 7.37 cm2
Calc EF: 39.7 %
Height: 74 in
MV VTI: 3.18 cm2
S' Lateral: 3.7 cm
Single Plane A2C EF: 29.2 %
Single Plane A4C EF: 49.3 %
Weight: 2803.2 [oz_av]

## 2023-11-19 LAB — BASIC METABOLIC PANEL WITH GFR
Anion gap: 11 (ref 5–15)
Anion gap: 14 (ref 5–15)
BUN: 21 mg/dL (ref 8–23)
BUN: 21 mg/dL (ref 8–23)
CO2: 22 mmol/L (ref 22–32)
CO2: 23 mmol/L (ref 22–32)
Calcium: 8.4 mg/dL — ABNORMAL LOW (ref 8.9–10.3)
Calcium: 8.7 mg/dL — ABNORMAL LOW (ref 8.9–10.3)
Chloride: 100 mmol/L (ref 98–111)
Chloride: 103 mmol/L (ref 98–111)
Creatinine, Ser: 1.23 mg/dL (ref 0.61–1.24)
Creatinine, Ser: 1.31 mg/dL — ABNORMAL HIGH (ref 0.61–1.24)
GFR, Estimated: 54 mL/min — ABNORMAL LOW (ref >60)
GFR, Estimated: 58 mL/min — ABNORMAL LOW (ref >60)
Glucose, Bld: 107 mg/dL — ABNORMAL HIGH (ref 70–99)
Glucose, Bld: 117 mg/dL — ABNORMAL HIGH (ref 70–99)
Potassium: 3.8 mmol/L (ref 3.5–5.1)
Potassium: 3.8 mmol/L (ref 3.5–5.1)
Sodium: 136 mmol/L (ref 135–145)
Sodium: 137 mmol/L (ref 135–145)

## 2023-11-19 LAB — POCT ACTIVATED CLOTTING TIME
Activated Clotting Time: 297 s
Activated Clotting Time: 308 s

## 2023-11-19 LAB — TROPONIN I (HIGH SENSITIVITY): Troponin I (High Sensitivity): 1944 ng/L (ref <18)

## 2023-11-19 LAB — LIPOPROTEIN A (LPA): Lipoprotein (a): <8.4 nmol/L (ref <75.0)

## 2023-11-19 LAB — LIPID PANEL
Cholesterol: 168 mg/dL (ref 0–200)
HDL: 40 mg/dL — ABNORMAL LOW (ref >40)
LDL Cholesterol: 107 mg/dL — ABNORMAL HIGH (ref 0–99)
Total CHOL/HDL Ratio: 4.2 ratio
Triglycerides: 104 mg/dL (ref <150)
VLDL: 21 mg/dL (ref 0–40)

## 2023-11-19 LAB — HEPARIN LEVEL (UNFRACTIONATED)
Heparin Unfractionated: 0.4 [IU]/mL (ref 0.30–0.70)
Heparin Unfractionated: 0.46 [IU]/mL (ref 0.30–0.70)

## 2023-11-19 LAB — MAGNESIUM: Magnesium: 2.2 mg/dL (ref 1.7–2.4)

## 2023-11-19 MED ORDER — ADENOSINE 6 MG/2ML IV SOLN
INTRAVENOUS | Status: AC
  Filled 2023-11-19: qty 2

## 2023-11-19 MED ORDER — SODIUM CHLORIDE 0.9% FLUSH
3.0000 mL | Freq: Two times a day (BID) | INTRAVENOUS | Status: DC
  Administered 2023-11-19 – 2023-11-20 (×2): 3 mL via INTRAVENOUS

## 2023-11-19 MED ORDER — MIDAZOLAM HCL (PF) 2 MG/2ML IJ SOLN
INTRAMUSCULAR | Status: DC | PRN
  Administered 2023-11-19: 1 mg via INTRAVENOUS

## 2023-11-19 MED ORDER — MORPHINE SULFATE (PF) 2 MG/ML IV SOLN: 2.0000 mg | INTRAVENOUS | Status: DC | PRN

## 2023-11-19 MED ORDER — SODIUM CHLORIDE 0.9% FLUSH: 3.0000 mL | INTRAVENOUS | Status: DC | PRN

## 2023-11-19 MED ORDER — ADENOSINE (DIAGNOSTIC) FOR INTRACORONARY USE
INTRAVENOUS | Status: DC | PRN
  Administered 2023-11-19 (×2): 6 ug via INTRACORONARY

## 2023-11-19 MED ORDER — CLOPIDOGREL BISULFATE 300 MG PO TABS
ORAL_TABLET | ORAL | Status: AC
  Filled 2023-11-19: qty 1

## 2023-11-19 MED ORDER — HYDRALAZINE HCL 20 MG/ML IJ SOLN: 10.0000 mg | INTRAMUSCULAR | Status: AC | PRN

## 2023-11-19 MED ORDER — SODIUM CHLORIDE 0.9 % WEIGHT BASED INFUSION
3.0000 mL/kg/h | INTRAVENOUS | Status: DC
Start: 2023-11-19 — End: 2023-11-19
  Administered 2023-11-19: 3 mL/kg/h via INTRAVENOUS

## 2023-11-19 MED ORDER — MIDAZOLAM HCL 2 MG/2ML IJ SOLN
INTRAMUSCULAR | Status: AC
  Filled 2023-11-19: qty 2

## 2023-11-19 MED ORDER — VERAPAMIL HCL 2.5 MG/ML IV SOLN
INTRAVENOUS | Status: AC
Start: 2023-11-19 — End: 2023-11-19
  Filled 2023-11-19: qty 2

## 2023-11-19 MED ORDER — CLOPIDOGREL BISULFATE 75 MG PO TABS
75.0000 mg | ORAL_TABLET | Freq: Every day | ORAL | Status: DC
  Administered 2023-11-20: 75 mg via ORAL
  Filled 2023-11-19: qty 1

## 2023-11-19 MED ORDER — IOHEXOL 350 MG/ML SOLN
INTRAVENOUS | Status: DC | PRN
  Administered 2023-11-19: 130 mL

## 2023-11-19 MED ORDER — FREE WATER
500.0000 mL | Freq: Once | Status: AC
  Administered 2023-11-19: 500 mL via ORAL

## 2023-11-19 MED ORDER — LIDOCAINE HCL (PF) 1 % IJ SOLN
INTRAMUSCULAR | Status: DC | PRN
  Administered 2023-11-19: 5 mL

## 2023-11-19 MED ORDER — FENTANYL CITRATE (PF) 100 MCG/2ML IJ SOLN
INTRAMUSCULAR | Status: DC | PRN
  Administered 2023-11-19: 25 ug via INTRAVENOUS

## 2023-11-19 MED ORDER — CLOPIDOGREL BISULFATE 300 MG PO TABS
ORAL_TABLET | ORAL | Status: DC | PRN
  Administered 2023-11-19: 600 mg via ORAL

## 2023-11-19 MED ORDER — LABETALOL HCL 5 MG/ML IV SOLN: 10.0000 mg | INTRAVENOUS | Status: AC | PRN

## 2023-11-19 MED ORDER — PERFLUTREN LIPID MICROSPHERE
1.0000 mL | INTRAVENOUS | Status: AC | PRN
  Administered 2023-11-19: 4 mL via INTRAVENOUS

## 2023-11-19 MED ORDER — FENTANYL CITRATE (PF) 100 MCG/2ML IJ SOLN
INTRAMUSCULAR | Status: AC
  Filled 2023-11-19: qty 2

## 2023-11-19 MED ORDER — HEPARIN (PORCINE) IN NACL 2000-0.9 UNIT/L-% IV SOLN
INTRAVENOUS | Status: DC | PRN
Start: 2023-11-19 — End: 2023-11-19
  Administered 2023-11-19: 1000 mL

## 2023-11-19 MED ORDER — SODIUM CHLORIDE 0.9 % WEIGHT BASED INFUSION
1.0000 mL/kg/h | INTRAVENOUS | Status: DC
Start: 2023-11-19 — End: 2023-11-19

## 2023-11-19 MED ORDER — SODIUM CHLORIDE 0.9 % IV SOLN: 250.0000 mL | INTRAVENOUS | Status: DC | PRN

## 2023-11-19 MED ORDER — EZETIMIBE 10 MG PO TABS
10.0000 mg | ORAL_TABLET | Freq: Every day | ORAL | Status: DC
  Administered 2023-11-19 – 2023-11-20 (×2): 10 mg via ORAL
  Filled 2023-11-19 (×2): qty 1

## 2023-11-19 MED ORDER — VERAPAMIL HCL 2.5 MG/ML IV SOLN
INTRAVENOUS | Status: DC | PRN
  Administered 2023-11-19: 10 mL via INTRA_ARTERIAL

## 2023-11-19 MED ORDER — HEPARIN SODIUM (PORCINE) 1000 UNIT/ML IJ SOLN
INTRAMUSCULAR | Status: AC
  Filled 2023-11-19: qty 10

## 2023-11-19 MED ORDER — HEPARIN SODIUM (PORCINE) 1000 UNIT/ML IJ SOLN
INTRAMUSCULAR | Status: DC | PRN
  Administered 2023-11-19: 4000 [IU] via INTRAVENOUS
  Administered 2023-11-19: 5000 [IU] via INTRAVENOUS

## 2023-11-19 SURGICAL SUPPLY — 19 items
BALLOON EMERGE MR 3.0X12 (BALLOONS) IMPLANT
BALLOON SAPPHIRE NC24 4.5X12 (BALLOONS) IMPLANT
BALLOON SCOREFLEX 4.0X15 (BALLOONS) IMPLANT
BALLOON TAKERU 2.0X12 (BALLOONS) IMPLANT
CATH INFINITI 5FR MULTPACK ANG (CATHETERS) IMPLANT
CATH LAUNCHER 5F AR1 (CATHETERS) IMPLANT
CATH LAUNCHER 6FR JR4 (CATHETERS) IMPLANT
DEVICE RAD COMP TR BAND LRG (VASCULAR PRODUCTS) IMPLANT
GLIDESHEATH SLEND SS 6F .021 (SHEATH) IMPLANT
GUIDEWIRE INQWIRE 1.5J.035X260 (WIRE) IMPLANT
KIT ENCORE 26 ADVANTAGE (KITS) IMPLANT
KIT HEMO VALVE WATCHDOG (MISCELLANEOUS) IMPLANT
PACK CARDIAC CATHETERIZATION (CUSTOM PROCEDURE TRAY) ×1 IMPLANT
SET ATX-X65L (MISCELLANEOUS) IMPLANT
SHEATH PROBE COVER 6X72 (BAG) IMPLANT
STENT ONYX FRONTIER 4.0X18 (Permanent Stent) IMPLANT
WIRE ASAHI PROWATER 180CM (WIRE) IMPLANT
WIRE HI TORQ BMW 190CM (WIRE) IMPLANT
WIRE HI TORQ VERSACORE-J 145CM (WIRE) IMPLANT

## 2023-11-19 NOTE — Progress Notes (Signed)
 Rounding Note   Patient Name: Jacob Weber Date of Encounter: 11/19/2023  [facility] Cardiologist: Redell Leiter, MD   Subjective - No acute events overnight - Patient reports having intermittent episodes of chest pain overnight - Troponins peaked at 2000 - Plan for [facility] today  Scheduled Meds:  allopurinol   100 mg Oral QHS   allopurinol   300 mg Oral q AM   alum & mag hydroxide-simeth  30 mL Oral Once   aspirin   81 mg Oral Daily   carvedilol   12.5 mg Oral BID   latanoprost  1 drop Both Eyes QHS   levothyroxine   100 mcg Oral Q0600   Continuous Infusions:  sodium chloride  1 mL/kg/hr (11/19/23 0444)   heparin 1,100 Units/hr (11/18/23 1749)   PRN Meds: acetaminophen , ondansetron  (ZOFRAN ) IV, pantoprazole , perflutren  lipid microspheres (DEFINITY ) IV suspension   Vital Signs  Vitals:   11/18/23 2116 11/19/23 0027 11/19/23 0346 11/19/23 0745  BP: 121/77 (!) 96/47 115/68 112/70  Pulse: 74  69 74  Resp: 19 19 18 20   Temp: 97.7 F (36.5 C) 98.5 F (36.9 C) 98.4 F (36.9 C) 97.6 F (36.4 C)  TempSrc: Oral Oral Oral Oral  SpO2: 99% 97% 96% 90%  Weight: 79.7 kg  79.5 kg   Height: 6' 2 (1.88 m)       Intake/Output Summary (Last 24 hours) at 11/19/2023 1012 Last data filed at 11/19/2023 0805 Gross per 24 hour  Intake 420.8 ml  Output 1125 ml  Net -704.2 ml      11/19/2023    3:46 AM 11/18/2023    9:16 PM 11/18/2023    5:07 PM  Last 3 Weights  Weight (lbs) 175 lb 3.2 oz 175 lb 11.2 oz 187 lb 2.7 oz  Weight (kg) 79.47 kg 79.697 kg 84.9 kg      Telemetry NSR with Quadrimeny- Personally Reviewed  ECG  No new ECG  Physical Exam  GEN: No acute distress.   Neck: No JVD Cardiac: RRR but distant heart sounds, no murmurs, rubs, or gallops.  Respiratory: Clear to auscultation bilaterally. GI: Soft, nontender, non-distended  MS: No edema; No deformity. Neuro:  Nonfocal  Psych: Normal affect   Labs High Sensitivity Troponin:   Recent Labs  Lab  11/18/23 1311 11/18/23 1504 11/18/23 1739 11/18/23 2132 11/18/23 2332  TROPONINIHS 114* 502* 1,490* 2,074* 1,944*     Chemistry Recent Labs  Lab 11/18/23 1311 11/18/23 2332 11/19/23 0646  NA 136  137 137 136  K 4.4  4.4 3.8 3.8  CL 101  102 100 103  CO2 20*  23 23 22   GLUCOSE 125*  127* 107* 117*  BUN 22  22 21 21   CREATININE 1.43*  1.40* 1.31* 1.23  CALCIUM 9.0  9.0 8.7* 8.4*  MG  --  2.2  --   PROT 7.0  --   --   ALBUMIN 3.8  --   --   AST 27  --   --   ALT 16  --   --   ALKPHOS 43  --   --   BILITOT 1.1  --   --   GFRNONAA 48*  50* 54* 58*  ANIONGAP 15  12 14 11     Lipids  Recent Labs  Lab 11/18/23 2332  CHOL 168  TRIG 104  HDL 40*  LDLCALC 107*  CHOLHDL 4.2    Hematology Recent Labs  Lab 11/18/23 1311 11/18/23 2332  WBC 7.8 6.2  RBC 3.99* 3.69*  HGB 13.8 13.1  HCT 40.2 36.5*  MCV 100.8* 98.9  MCH 34.6* 35.5*  MCHC 34.3 35.9  RDW 13.6 13.6  PLT 148* 132*   Thyroid No results for input(s): TSH, FREET4 in the last 168 hours.  BNP Recent Labs  Lab 11/18/23 1739  BNP 125.1*    DDimer No results for input(s): DDIMER in the last 168 hours.   Radiology  CT Angio Chest/Abd/Pel for Dissection W and/or Wo Contrast Result Date: 11/18/2023 CLINICAL DATA:  Chest pain, abdominal pain EXAM: CT ANGIOGRAPHY CHEST, ABDOMEN AND PELVIS TECHNIQUE: Non-contrast CT of the chest was initially obtained. Multidetector CT imaging through the chest, abdomen and pelvis was performed using the standard protocol during bolus administration of intravenous contrast. Multiplanar reconstructed images and MIPs were obtained and reviewed to evaluate the vascular anatomy. RADIATION DOSE REDUCTION: This exam was performed according to the departmental dose-optimization program which includes automated exposure control, adjustment of the mA and/or kV according to patient size and/or use of iterative reconstruction technique. CONTRAST:  75mL OMNIPAQUE IOHEXOL 350 MG/ML  SOLN COMPARISON:  10/29/2023 FINDINGS: CTA CHEST FINDINGS Cardiovascular: 4.1 cm ascending thoracic aortic aneurysm. No evidence of thoracic aortic dissection. There is diffuse aortic atherosclerosis. Great vessels are widely patent. The heart is unremarkable without pericardial effusion. There is extensive atherosclerosis of the coronary vasculature. Postsurgical changes from prior CABG. There is technically adequate opacification of the central pulmonary vasculature. No evidence of central or segmental pulmonary emboli. Mediastinum/Nodes: No enlarged mediastinal, hilar, or axillary lymph nodes. Thyroid gland, trachea, and esophagus demonstrate no significant findings. Lungs/Pleura: No acute airspace disease, effusion, or pneumothorax. Upper lobe predominant emphysema. Stable bibasilar scarring. Stable 7 mm left lower lobe nodule image 143/8, benign given long-term stability since at least 2020. No specific follow-up recommended. Central airways are patent. Musculoskeletal: No acute or destructive bony abnormalities. Right shoulder arthroplasty. Reconstructed images demonstrate no additional findings. Review of the MIP images confirms the above findings. CTA ABDOMEN AND PELVIS FINDINGS VASCULAR Aorta: Normal caliber aorta without aneurysm, dissection, vasculitis or significant stenosis. Diffuse atherosclerosis. Celiac: Patent without evidence of aneurysm, dissection, vasculitis or significant stenosis. SMA: Patent without evidence of aneurysm, dissection, vasculitis or significant stenosis. Renals: There is significant atherosclerosis at the origin of the bilateral renal arteries, right greater than left. On the right, estimated stenosis 50-70%, and on the left estimated less than 50%. No aneurysm, dissection, vasculitis, or fibromuscular dysplasia. IMA: Patent without evidence of aneurysm, dissection, vasculitis or significant stenosis. Inflow: Patent without evidence of aneurysm, dissection, vasculitis or  significant stenosis. Veins: No obvious venous abnormality within the limitations of this arterial phase study. Review of the MIP images confirms the above findings. NON-VASCULAR Hepatobiliary: No focal liver abnormality is seen. Status post cholecystectomy. No biliary dilatation. Pancreas: Unremarkable. No pancreatic ductal dilatation or surrounding inflammatory changes. Spleen: Normal in size without focal abnormality. Adrenals/Urinary Tract: There is a 4 mm nonobstructing calculus at the right UVJ. There are 3 additional punctate less than 3 mm nonobstructing right renal calculi. No left-sided calculi or obstruction. Mild bilateral renal cortical thinning. The adrenals and bladder are unremarkable. Stomach/Bowel: No bowel obstruction or ileus. No bowel wall thickening or inflammatory change. Lymphatic: No pathologic adenopathy. Reproductive: Prostate is unremarkable. Other: No free fluid or free intraperitoneal gas. No abdominal wall hernia. Musculoskeletal: Right hip arthroplasty. No acute or destructive bony abnormalities. Reconstructed images demonstrate no additional findings. Review of the MIP images confirms the above findings. IMPRESSION: Vascular: 1. 4.1 cm ascending thoracic aortic aneurysm. Recommend annual  imaging followup by CTA or MRA. This recommendation follows 2010 ACCF/AHA/AATS/ACR/ASA/SCA/SCAI/SIR/STS/SVM Guidelines for the Diagnosis and Management of Patients with Thoracic Aortic Disease. Circulation. 2010; 121: Z733-z630. Aortic aneurysm NOS (ICD10-I71.9) 2. No evidence of aortic dissection. 3. No evidence of pulmonary embolus. 4. 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 5.  Aortic Atherosclerosis (ICD10-I70.0). Nonvascular: 1. Nonobstructing 4 mm right UVJ calculus. Other punctate nonobstructing right renal calculi as above. 2.  Emphysema (ICD10-J43.9). Electronically Signed   By: Ozell Daring M.D.   On: 11/18/2023 15:14   DG Chest 2 View Result Date:  11/18/2023 EXAM: 2 VIEW(S) XRAY OF THE CHEST 11/18/2023 01:28:00 PM COMPARISON: 01/03/2022. CLINICAL HISTORY: Chest pain. FINDINGS: LUNGS AND PLEURA: Subsegmental linear atelectasis vs scarring at the left lung base. No focal pulmonary opacity. No overt pulmonary edema. No pleural effusion. No pneumothorax. HEART AND MEDIASTINUM: No acute abnormality of the cardiac and mediastinal silhouettes. Aortic atherosclerosis. Prior median sternotomy and CABG. BONES AND SOFT TISSUES: Right reverse shoulder arthroplasty. Diffuse osseous demineralization. IMPRESSION: 1. Subsegmental linear atelectasis vs scarring at the left lung base. Otherwise, no acute cardiopulmonary findings. 2. Aortic atherosclerosis. Electronically signed by: Shahmeer Marcelino MD 11/18/2023 02:01 PM EDT RP Workstation: [id number]    Cardiac Studies  TTE - pending  Patient Profile   Jacob Weber is a 83 y.o. 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   Assessment & Plan    #NSTEMI #CAD s/p CABG :: Patient presenting with atypical epigastric pain found to have markedly elevated troponins consistent with ACS.  The patient is high risk for obstructive CAD given that he has had previous CABG and is not currently taking any lipid-lowering therapy.  Plan for LHC today.  Echocardiogram results pending. - LHC today -Continue heparin per pharmacy - continue aspirin  81 mg daily - Complete echocardiogram - Lipid panel, lipoprotein a -Start Zetia  10 mg daily - Intolerant to statins -Consider PCSK9 if the patient is agreeable - Ensure cardiac rehab at discharge   #HTN #HLD - Continue Coreg  12.5 mg twice daily -Start Zetia  10 mg daily   #HFimpEF -  Follow-up echocardiogram - Continuing Coreg  - May need ACEI/ARB depending on echo results      For questions or updates, please contact [facility] Please consult [email or web address] for contact info under       Signed, Georganna Archer, MD  11/19/2023, 10:12 AM

## 2023-11-19 NOTE — Progress Notes (Signed)
 PHARMACY - ANTICOAGULATION CONSULT NOTE  Pharmacy Consult for heparin Indication: chest pain/ACS  Patient Measurements: Height: 6' 2 (188 cm) Weight: 79.7 kg (175 lb 11.2 oz) IBW/kg (Calculated) : 82.2 HEPARIN DW (KG): 79.7  Vital Signs: Temp: 97.7 F (36.5 C) (10/23 2116) Temp Source: Oral (10/23 2116) BP: 121/77 (10/23 2116) Pulse Rate: 74 (10/23 2116)  Labs: Recent Labs    11/18/23 1311 11/18/23 1504 11/18/23 1739 11/18/23 2132 11/18/23 2332  HGB 13.8  --   --   --  13.1  HCT 40.2  --   --   --  36.5*  PLT 148*  --   --   --  132*  HEPARINUNFRC  --   --   --   --  0.46  CREATININE 1.43*  1.40*  --   --   --   --   TROPONINIHS 114* 502* 1,490* 2,074*  --     Estimated Creatinine Clearance: 44.3 mL/min (A) (by C-G formula based on SCr of 1.4 mg/dL (H)).  Assessment: Patient is an 83 YO M who presented to the ED with epigastric pain improved after taking 1 nitroglycerin. Troponins 114 > 502. Pharmacy consulted to dose heparin for ACS. Patient not on anticoagulation PTA. Hgb normal and platelets low-normal at 148.  AM: heparin level therapeutic on 1100 units/hr (~5h after start). Per RN, no signs/symptoms of bleeding. CBC stable  Goal of Therapy:  Heparin level 0.3-0.7 units/ml Monitor platelets by anticoagulation protocol: Yes   Plan:  -Continue heparin infusion at 1100 units/h.  -Check confirmatory heparin level in 8h -Monitor heparin level, CBC daily -F/u plans for [facility] on 10/24  Lynwood Poplar, PharmD, BCPS Clinical Pharmacist 11/19/2023 12:10 AM

## 2023-11-19 NOTE — H&P (View-Only) (Signed)
 Rounding Note   Patient Name: Jacob Weber Date of Encounter: 11/19/2023  Jacob Weber Cardiologist: Redell Leiter, MD   Subjective - No acute events overnight - Patient reports having intermittent episodes of chest pain overnight - Troponins peaked at 2000 - Plan for [facility] today  Scheduled Meds:  allopurinol   100 mg Oral QHS   allopurinol   300 mg Oral q AM   alum & mag hydroxide-simeth  30 mL Oral Once   aspirin   81 mg Oral Daily   carvedilol   12.5 mg Oral BID   latanoprost  1 drop Both Eyes QHS   levothyroxine   100 mcg Oral Q0600   Continuous Infusions:  sodium chloride  1 mL/kg/hr (11/19/23 0444)   heparin 1,100 Units/hr (11/18/23 1749)   PRN Meds: acetaminophen , ondansetron  (ZOFRAN ) IV, pantoprazole , perflutren  lipid microspheres (DEFINITY ) IV suspension   Vital Signs  Vitals:   11/18/23 2116 11/19/23 0027 11/19/23 0346 11/19/23 0745  BP: 121/77 (!) 96/47 115/68 112/70  Pulse: 74  69 74  Resp: 19 19 18 20   Temp: 97.7 F (36.5 C) 98.5 F (36.9 C) 98.4 F (36.9 C) 97.6 F (36.4 C)  TempSrc: Oral Oral Oral Oral  SpO2: 99% 97% 96% 90%  Weight: 79.7 kg  79.5 kg   Height: 6' 2 (1.88 m)       Intake/Output Summary (Last 24 hours) at 11/19/2023 1012 Last data filed at 11/19/2023 0805 Gross per 24 hour  Intake 420.8 ml  Output 1125 ml  Net -704.2 ml      11/19/2023    3:46 AM 11/18/2023    9:16 PM 11/18/2023    5:07 PM  Last 3 Weights  Weight (lbs) 175 lb 3.2 oz 175 lb 11.2 oz 187 lb 2.7 oz  Weight (kg) 79.47 kg 79.697 kg 84.9 kg      Telemetry NSR with Quadrimeny- Personally Reviewed  ECG  No new ECG  Physical Exam  GEN: No acute distress.   Neck: No JVD Cardiac: RRR but distant heart sounds, no murmurs, rubs, or gallops.  Respiratory: Clear to auscultation bilaterally. GI: Soft, nontender, non-distended  MS: No edema; No deformity. Neuro:  Nonfocal  Psych: Normal affect   Labs High Sensitivity Troponin:   Recent Labs  Lab  11/18/23 1311 11/18/23 1504 11/18/23 1739 11/18/23 2132 11/18/23 2332  TROPONINIHS 114* 502* 1,490* 2,074* 1,944*     Chemistry Recent Labs  Lab 11/18/23 1311 11/18/23 2332 11/19/23 0646  NA 136  137 137 136  K 4.4  4.4 3.8 3.8  CL 101  102 100 103  CO2 20*  23 23 22   GLUCOSE 125*  127* 107* 117*  BUN 22  22 21 21   CREATININE 1.43*  1.40* 1.31* 1.23  CALCIUM 9.0  9.0 8.7* 8.4*  MG  --  2.2  --   PROT 7.0  --   --   ALBUMIN 3.8  --   --   AST 27  --   --   ALT 16  --   --   ALKPHOS 43  --   --   BILITOT 1.1  --   --   GFRNONAA 48*  50* 54* 58*  ANIONGAP 15  12 14 11     Lipids  Recent Labs  Lab 11/18/23 2332  CHOL 168  TRIG 104  HDL 40*  LDLCALC 107*  CHOLHDL 4.2    Hematology Recent Labs  Lab 11/18/23 1311 11/18/23 2332  WBC 7.8 6.2  RBC 3.99* 3.69*  HGB 13.8 13.1  HCT 40.2 36.5*  MCV 100.8* 98.9  MCH 34.6* 35.5*  MCHC 34.3 35.9  RDW 13.6 13.6  PLT 148* 132*   Thyroid No results for input(s): TSH, FREET4 in the last 168 hours.  BNP Recent Labs  Lab 11/18/23 1739  BNP 125.1*    DDimer No results for input(s): DDIMER in the last 168 hours.   Radiology  CT Angio Chest/Abd/Pel for Dissection W and/or Wo Contrast Result Date: 11/18/2023 CLINICAL DATA:  Chest pain, abdominal pain EXAM: CT ANGIOGRAPHY CHEST, ABDOMEN AND PELVIS TECHNIQUE: Non-contrast CT of the chest was initially obtained. Multidetector CT imaging through the chest, abdomen and pelvis was performed using the standard protocol during bolus administration of intravenous contrast. Multiplanar reconstructed images and MIPs were obtained and reviewed to evaluate the vascular anatomy. RADIATION DOSE REDUCTION: This exam was performed according to the departmental dose-optimization program which includes automated exposure control, adjustment of the mA and/or kV according to patient size and/or use of iterative reconstruction technique. CONTRAST:  75mL OMNIPAQUE IOHEXOL 350 MG/ML  SOLN COMPARISON:  10/29/2023 FINDINGS: CTA CHEST FINDINGS Cardiovascular: 4.1 cm ascending thoracic aortic aneurysm. No evidence of thoracic aortic dissection. There is diffuse aortic atherosclerosis. Great vessels are widely patent. The heart is unremarkable without pericardial effusion. There is extensive atherosclerosis of the coronary vasculature. Postsurgical changes from prior CABG. There is technically adequate opacification of the central pulmonary vasculature. No evidence of central or segmental pulmonary emboli. Mediastinum/Nodes: No enlarged mediastinal, hilar, or axillary lymph nodes. Thyroid gland, trachea, and esophagus demonstrate no significant findings. Lungs/Pleura: No acute airspace disease, effusion, or pneumothorax. Upper lobe predominant emphysema. Stable bibasilar scarring. Stable 7 mm left lower lobe nodule image 143/8, benign given long-term stability since at least 2020. No specific follow-up recommended. Central airways are patent. Musculoskeletal: No acute or destructive bony abnormalities. Right shoulder arthroplasty. Reconstructed images demonstrate no additional findings. Review of the MIP images confirms the above findings. CTA ABDOMEN AND PELVIS FINDINGS VASCULAR Aorta: Normal caliber aorta without aneurysm, dissection, vasculitis or significant stenosis. Diffuse atherosclerosis. Celiac: Patent without evidence of aneurysm, dissection, vasculitis or significant stenosis. SMA: Patent without evidence of aneurysm, dissection, vasculitis or significant stenosis. Renals: There is significant atherosclerosis at the origin of the bilateral renal arteries, right greater than left. On the right, estimated stenosis 50-70%, and on the left estimated less than 50%. No aneurysm, dissection, vasculitis, or fibromuscular dysplasia. IMA: Patent without evidence of aneurysm, dissection, vasculitis or significant stenosis. Inflow: Patent without evidence of aneurysm, dissection, vasculitis or  significant stenosis. Veins: No obvious venous abnormality within the limitations of this arterial phase study. Review of the MIP images confirms the above findings. NON-VASCULAR Hepatobiliary: No focal liver abnormality is seen. Status post cholecystectomy. No biliary dilatation. Pancreas: Unremarkable. No pancreatic ductal dilatation or surrounding inflammatory changes. Spleen: Normal in size without focal abnormality. Adrenals/Urinary Tract: There is a 4 mm nonobstructing calculus at the right UVJ. There are 3 additional punctate less than 3 mm nonobstructing right renal calculi. No left-sided calculi or obstruction. Mild bilateral renal cortical thinning. The adrenals and bladder are unremarkable. Stomach/Bowel: No bowel obstruction or ileus. No bowel wall thickening or inflammatory change. Lymphatic: No pathologic adenopathy. Reproductive: Prostate is unremarkable. Other: No free fluid or free intraperitoneal gas. No abdominal wall hernia. Musculoskeletal: Right hip arthroplasty. No acute or destructive bony abnormalities. Reconstructed images demonstrate no additional findings. Review of the MIP images confirms the above findings. IMPRESSION: Vascular: 1. 4.1 cm ascending thoracic aortic aneurysm. Recommend annual  imaging followup by CTA or MRA. This recommendation follows 2010 ACCF/AHA/AATS/ACR/ASA/SCA/SCAI/SIR/STS/SVM Guidelines for the Diagnosis and Management of Patients with Thoracic Aortic Disease. Circulation. 2010; 121: Z733-z630. Aortic aneurysm NOS (ICD10-I71.9) 2. No evidence of aortic dissection. 3. No evidence of pulmonary embolus. 4. 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 5.  Aortic Atherosclerosis (ICD10-I70.0). Nonvascular: 1. Nonobstructing 4 mm right UVJ calculus. Other punctate nonobstructing right renal calculi as above. 2.  Emphysema (ICD10-J43.9). Electronically Signed   By: Ozell Daring M.D.   On: 11/18/2023 15:14   DG Chest 2 View Result Date:  11/18/2023 EXAM: 2 VIEW(S) XRAY OF THE CHEST 11/18/2023 01:28:00 PM COMPARISON: 01/03/2022. CLINICAL HISTORY: Chest pain. FINDINGS: LUNGS AND PLEURA: Subsegmental linear atelectasis vs scarring at the left lung base. No focal pulmonary opacity. No overt pulmonary edema. No pleural effusion. No pneumothorax. HEART AND MEDIASTINUM: No acute abnormality of the cardiac and mediastinal silhouettes. Aortic atherosclerosis. Prior median sternotomy and CABG. BONES AND SOFT TISSUES: Right reverse shoulder arthroplasty. Diffuse osseous demineralization. IMPRESSION: 1. Subsegmental linear atelectasis vs scarring at the left lung base. Otherwise, no acute cardiopulmonary findings. 2. Aortic atherosclerosis. Electronically signed by: Shahmeer Marcelino MD 11/18/2023 02:01 PM EDT RP Workstation: [id number]    Cardiac Studies  TTE - pending  Patient Profile   BELDON NOWLING is a 83 y.o. 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   Assessment & Plan    #NSTEMI #CAD s/p CABG :: Patient presenting with atypical epigastric pain found to have markedly elevated troponins consistent with ACS.  The patient is high risk for obstructive CAD given that he has had previous CABG and is not currently taking any lipid-lowering therapy.  Plan for LHC today.  Echocardiogram results pending. - LHC today -Continue heparin per pharmacy - continue aspirin  81 mg daily - Complete echocardiogram - Lipid panel, lipoprotein a -Start Zetia  10 mg daily - Intolerant to statins -Consider PCSK9 if the patient is agreeable - Ensure cardiac rehab at discharge   #HTN #HLD - Continue Coreg  12.5 mg twice daily -Start Zetia  10 mg daily   #HFimpEF -  Follow-up echocardiogram - Continuing Coreg  - May need ACEI/ARB depending on echo results      For questions or updates, please contact Moody Weber Please consult [email or web address] for contact info under       Signed, Georganna Archer, MD  11/19/2023, 10:12 AM

## 2023-11-19 NOTE — Progress Notes (Signed)
 PROGRESS NOTE    Jacob Weber  FMW:[id number] DOB: 1940-11-23 DOA: 11/18/2023 PCP: Ofilia Lamar LITTIE, MD  83/M with history of chronic diastolic CHF, CAD/CABG, ascending aortic aneurysm, hypertension, COPD, IBS, trigeminal neuralgia, hypothyroidism presented to the ED with chest pain. - Troponin trended up from 100s up to 2074, started on IV heparin, cards consulting, plan for [facility]   Subjective: -Feels fair, intermittent chest discomfort overnight  Assessment and Plan:  NSTEMI CAD/CABG - Troponin peaked at 2074 -Continue IV heparin today, continue aspirin , starting Zetia  -Follow-up echo, intolerant to statins -Cards following, LHC today   Hypertension - Stable, continue Coreg    Hyperlipidemia - Not on statin as above   GERD - Continue PPI   Hypothyroidism - Continue Synthroid    Ascending aortic aneurysm - Stable on imaging   Chronic combined systolic and diastolic CHF > Echo in 2021 with EF 50 to 55%, G1 DD, normal RV function. - Follow-up repeat echo, continue Coreg , Lasix  on hold now euvolemic   Glaucoma - Continue home eyedrops   DVT prophylaxis:      Heparin Code Status:              Full Family Communication:      Son at bedside Disposition Plan: Home likely 1 to 2 days  Consultants:    Procedures:   Antimicrobials:    Objective: Vitals:   11/18/23 2116 11/19/23 0027 11/19/23 0346 11/19/23 0745  BP: 121/77 (!) 96/47 115/68 112/70  Pulse: 74  69 74  Resp: 19 19 18 20   Temp: 97.7 F (36.5 C) 98.5 F (36.9 C) 98.4 F (36.9 C) 97.6 F (36.4 C)  TempSrc: Oral Oral Oral Oral  SpO2: 99% 97% 96% 90%  Weight: 79.7 kg  79.5 kg   Height: 6' 2 (1.88 m)       Intake/Output Summary (Last 24 hours) at 11/19/2023 1153 Last data filed at 11/19/2023 0805 Gross per 24 hour  Intake 420.8 ml  Output 1125 ml  Net -704.2 ml   Filed Weights   11/18/23 1707 11/18/23 2116 11/19/23 0346  Weight: 84.9 kg 79.7 kg 79.5 kg    Examination:  General exam:  Appears calm and comfortable, AAOx3 Respiratory system: Clear to auscultation Cardiovascular system: S1 & S2 heard, RRR.  Abd: nondistended, soft and nontender.Normal bowel sounds heard. Central nervous system: Alert and oriented. No focal neurological deficits. Extremities: no edema Skin: No rashes Psychiatry:  Mood & affect appropriate.     Data Reviewed:   CBC: Recent Labs  Lab 11/18/23 1311 11/18/23 2332  WBC 7.8 6.2  HGB 13.8 13.1  HCT 40.2 36.5*  MCV 100.8* 98.9  PLT 148* 132*   Basic Metabolic Panel: Recent Labs  Lab 11/18/23 1311 11/18/23 2332 11/19/23 0646  NA 136  137 137 136  K 4.4  4.4 3.8 3.8  CL 101  102 100 103  CO2 20*  23 23 22   GLUCOSE 125*  127* 107* 117*  BUN 22  22 21 21   CREATININE 1.43*  1.40* 1.31* 1.23  CALCIUM 9.0  9.0 8.7* 8.4*  MG  --  2.2  --    GFR: Estimated Creatinine Clearance: 50.3 mL/min (by C-G formula based on SCr of 1.23 mg/dL). Liver Function Tests: Recent Labs  Lab 11/18/23 1311  AST 27  ALT 16  ALKPHOS 43  BILITOT 1.1  PROT 7.0  ALBUMIN 3.8   Recent Labs  Lab 11/18/23 1311  LIPASE 29   No results for input(s): AMMONIA in  the last 168 hours. Coagulation Profile: No results for input(s): INR, PROTIME in the last 168 hours. Cardiac Enzymes: No results for input(s): CKTOTAL, CKMB, CKMBINDEX, TROPONINI in the last 168 hours. BNP (last 3 results) No results for input(s): PROBNP in the last 8760 hours. HbA1C: No results for input(s): HGBA1C in the last 72 hours. CBG: No results for input(s): GLUCAP in the last 168 hours. Lipid Profile: Recent Labs    11/18/23 2332  CHOL 168  HDL 40*  LDLCALC 107*  TRIG 104  CHOLHDL 4.2   Thyroid Function Tests: No results for input(s): TSH, T4TOTAL, FREET4, T3FREE, THYROIDAB in the last 72 hours. Anemia Panel: No results for input(s): VITAMINB12, FOLATE, FERRITIN, TIBC, IRON, RETICCTPCT in the last 72 hours. Urine  analysis:    Component Value Date/Time   COLORURINE YELLOW 01/04/2022 0145   APPEARANCEUR CLEAR 01/04/2022 0145   LABSPEC 1.014 01/04/2022 0145   PHURINE 5.0 01/04/2022 0145   GLUCOSEU NEGATIVE 01/04/2022 0145   HGBUR MODERATE (A) 01/04/2022 0145   BILIRUBINUR NEGATIVE 01/04/2022 0145   KETONESUR NEGATIVE 01/04/2022 0145   PROTEINUR NEGATIVE 01/04/2022 0145   UROBILINOGEN 0.2 02/07/2009 1043   NITRITE NEGATIVE 01/04/2022 0145   LEUKOCYTESUR NEGATIVE 01/04/2022 0145   Sepsis Labs: @LABRCNTIP (procalcitonin:4,lacticidven:4)  )No results found for this or any previous visit (from the past 240 hours).   Radiology Studies: CT Angio Chest/Abd/Pel for Dissection W and/or Wo Contrast Result Date: 11/18/2023 CLINICAL DATA:  Chest pain, abdominal pain EXAM: CT ANGIOGRAPHY CHEST, ABDOMEN AND PELVIS TECHNIQUE: Non-contrast CT of the chest was initially obtained. Multidetector CT imaging through the chest, abdomen and pelvis was performed using the standard protocol during bolus administration of intravenous contrast. Multiplanar reconstructed images and MIPs were obtained and reviewed to evaluate the vascular anatomy. RADIATION DOSE REDUCTION: This exam was performed according to the departmental dose-optimization program which includes automated exposure control, adjustment of the mA and/or kV according to patient size and/or use of iterative reconstruction technique. CONTRAST:  75mL OMNIPAQUE IOHEXOL 350 MG/ML SOLN COMPARISON:  10/29/2023 FINDINGS: CTA CHEST FINDINGS Cardiovascular: 4.1 cm ascending thoracic aortic aneurysm. No evidence of thoracic aortic dissection. There is diffuse aortic atherosclerosis. Great vessels are widely patent. The heart is unremarkable without pericardial effusion. There is extensive atherosclerosis of the coronary vasculature. Postsurgical changes from prior CABG. There is technically adequate opacification of the central pulmonary vasculature. No evidence of central or  segmental pulmonary emboli. Mediastinum/Nodes: No enlarged mediastinal, hilar, or axillary lymph nodes. Thyroid gland, trachea, and esophagus demonstrate no significant findings. Lungs/Pleura: No acute airspace disease, effusion, or pneumothorax. Upper lobe predominant emphysema. Stable bibasilar scarring. Stable 7 mm left lower lobe nodule image 143/8, benign given long-term stability since at least 2020. No specific follow-up recommended. Central airways are patent. Musculoskeletal: No acute or destructive bony abnormalities. Right shoulder arthroplasty. Reconstructed images demonstrate no additional findings. Review of the MIP images confirms the above findings. CTA ABDOMEN AND PELVIS FINDINGS VASCULAR Aorta: Normal caliber aorta without aneurysm, dissection, vasculitis or significant stenosis. Diffuse atherosclerosis. Celiac: Patent without evidence of aneurysm, dissection, vasculitis or significant stenosis. SMA: Patent without evidence of aneurysm, dissection, vasculitis or significant stenosis. Renals: There is significant atherosclerosis at the origin of the bilateral renal arteries, right greater than left. On the right, estimated stenosis 50-70%, and on the left estimated less than 50%. No aneurysm, dissection, vasculitis, or fibromuscular dysplasia. IMA: Patent without evidence of aneurysm, dissection, vasculitis or significant stenosis. Inflow: Patent without evidence of aneurysm, dissection, vasculitis or significant stenosis. Veins:  No obvious venous abnormality within the limitations of this arterial phase study. Review of the MIP images confirms the above findings. NON-VASCULAR Hepatobiliary: No focal liver abnormality is seen. Status post cholecystectomy. No biliary dilatation. Pancreas: Unremarkable. No pancreatic ductal dilatation or surrounding inflammatory changes. Spleen: Normal in size without focal abnormality. Adrenals/Urinary Tract: There is a 4 mm nonobstructing calculus at the right UVJ.  There are 3 additional punctate less than 3 mm nonobstructing right renal calculi. No left-sided calculi or obstruction. Mild bilateral renal cortical thinning. The adrenals and bladder are unremarkable. Stomach/Bowel: No bowel obstruction or ileus. No bowel wall thickening or inflammatory change. Lymphatic: No pathologic adenopathy. Reproductive: Prostate is unremarkable. Other: No free fluid or free intraperitoneal gas. No abdominal wall hernia. Musculoskeletal: Right hip arthroplasty. No acute or destructive bony abnormalities. Reconstructed images demonstrate no additional findings. Review of the MIP images confirms the above findings. IMPRESSION: Vascular: 1. 4.1 cm ascending thoracic aortic aneurysm. Recommend annual imaging followup by CTA or MRA. This recommendation follows 2010 ACCF/AHA/AATS/ACR/ASA/SCA/SCAI/SIR/STS/SVM Guidelines for the Diagnosis and Management of Patients with Thoracic Aortic Disease. Circulation. 2010; 121: Z733-z630. Aortic aneurysm NOS (ICD10-I71.9) 2. No evidence of aortic dissection. 3. No evidence of pulmonary embolus. 4. 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 5.  Aortic Atherosclerosis (ICD10-I70.0). Nonvascular: 1. Nonobstructing 4 mm right UVJ calculus. Other punctate nonobstructing right renal calculi as above. 2.  Emphysema (ICD10-J43.9). Electronically Signed   By: Ozell Daring M.D.   On: 11/18/2023 15:14   DG Chest 2 View Result Date: 11/18/2023 EXAM: 2 VIEW(S) XRAY OF THE CHEST 11/18/2023 01:28:00 PM COMPARISON: 01/03/2022. CLINICAL HISTORY: Chest pain. FINDINGS: LUNGS AND PLEURA: Subsegmental linear atelectasis vs scarring at the left lung base. No focal pulmonary opacity. No overt pulmonary edema. No pleural effusion. No pneumothorax. HEART AND MEDIASTINUM: No acute abnormality of the cardiac and mediastinal silhouettes. Aortic atherosclerosis. Prior median sternotomy and CABG. BONES AND SOFT TISSUES: Right reverse shoulder arthroplasty.  Diffuse osseous demineralization. IMPRESSION: 1. Subsegmental linear atelectasis vs scarring at the left lung base. Otherwise, no acute cardiopulmonary findings. 2. Aortic atherosclerosis. Electronically signed by: Shahmeer Lateef MD 11/18/2023 02:01 PM EDT RP Workstation: [id number]     Scheduled Meds:  allopurinol   100 mg Oral QHS   allopurinol   300 mg Oral q AM   alum & mag hydroxide-simeth  30 mL Oral Once   aspirin   81 mg Oral Daily   carvedilol   12.5 mg Oral BID   ezetimibe   10 mg Oral Daily   latanoprost  1 drop Both Eyes QHS   levothyroxine   100 mcg Oral Q0600   Continuous Infusions:  sodium chloride  1 mL/kg/hr (11/19/23 0444)   heparin 1,100 Units/hr (11/18/23 1749)     LOS: 0 days    Time spent:    Sigurd Pac, MD [facility]   11/19/2023, 11:53 AM

## 2023-11-19 NOTE — Progress Notes (Signed)
 PHARMACY - ANTICOAGULATION CONSULT NOTE  Pharmacy Consult for heparin Indication: chest pain/ACS  Patient Measurements: Height: 6' 2 (188 cm) Weight: 79.5 kg (175 lb 3.2 oz) IBW/kg (Calculated) : 82.2 HEPARIN DW (KG): 79.7  Vital Signs: Temp: 97.6 F (36.4 C) (10/24 0745) Temp Source: Oral (10/24 0745) BP: 112/70 (10/24 0745) Pulse Rate: 74 (10/24 0745)  Labs: Recent Labs    11/18/23 1311 11/18/23 1504 11/18/23 1739 11/18/23 2132 11/18/23 2332 11/19/23 0646  HGB 13.8  --   --   --  13.1  --   HCT 40.2  --   --   --  36.5*  --   PLT 148*  --   --   --  132*  --   HEPARINUNFRC  --   --   --   --  0.46  --   CREATININE 1.43*  1.40*  --   --   --  1.31* 1.23  TROPONINIHS 114*   < > 1,490* 2,074* 1,944*  --    < > = values in this interval not displayed.    Estimated Creatinine Clearance: 50.3 mL/min (by C-G formula based on SCr of 1.23 mg/dL).  Assessment: Patient is an 83 YO M who presented to the ED with epigastric pain improved after taking 1 nitroglycerin. Troponins 114 > 502. Pharmacy consulted to dose heparin for ACS. Patient not on anticoagulation PTA. Hgb normal and platelets low-normal at 148.  10/24 AM update: HL 0.40 No signs of bleeding / pauses w/ gtt   Goal of Therapy:  Heparin level 0.3-0.7 units/ml Monitor platelets by anticoagulation protocol: Yes   Plan:  -Continue heparin infusion at 1100 units/h.  -Monitor heparin level, CBC daily -F/u plans for [facility] on 10/24   Rhoda Waldvogel BS, PharmD, BCPS Clinical Pharmacist 11/19/2023 8:29 AM  Contact: [phone] after 3 PM

## 2023-11-19 NOTE — Care Management Obs Status (Signed)
 MEDICARE OBSERVATION STATUS NOTIFICATION   Patient Details  Name: Jacob Weber MRN: [id number] Date of Birth: Nov 27, 1940   Medicare Observation Status Notification Given:  Yes    [name] 11/19/2023, 9:19 AM

## 2023-11-19 NOTE — Interval H&P Note (Signed)
 History and Physical Interval Note:  11/19/2023 3:53 PM  Jacob Weber  has presented today for surgery, with the diagnosis of NSTEMI.  The various methods of treatment have been discussed with the patient and family. After consideration of risks, benefits and other options for treatment, the patient has consented to  Procedure(s): LEFT HEART CATH AND CORONARY ANGIOGRAPHY (N/A) & GRAFT ANGIOGRAPHY PERCUTANEOUS CORONARY INTERVENTION   as a surgical intervention.  The patient's history has been reviewed, patient examined, no change in status, stable for surgery.  I have reviewed the patient's chart and labs.  Questions were answered to the patient's satisfaction.    Cath Lab Visit (complete for each Cath Lab visit)  Clinical Evaluation Leading to the Procedure:   ACS: Yes.    Non-ACS:    Anginal Classification: CCS IV  Anti-ischemic medical therapy: Maximal Therapy (2 or more classes of medications)  Non-Invasive Test Results: No non-invasive testing performed  Prior CABG: Previous CABG      Alm Clay

## 2023-11-19 NOTE — Brief Op Note (Addendum)
 Brief Cardiac Catheterization-PCI Note  11/19/2023  5:34 PM  PATIENT:  Jacob Weber  83 y.o. 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  He was admitted to most current hospital with non-STEMI.  He now presents for invasive evaluation Cardi catheterization poss PCI.  PRE-OPERATIVE DIAGNOSIS:  NSTEMI  POST-OPERATIVE DIAGNOSIS:  *   -------------------------------- NATIVE CORONARIES -------------------------------   Ost LAD to Prox LAD lesion is 50% stenosed. Prox LAD to Mid LAD lesion is 100% stenosed.   Mid Cx lesion is 60% stenosed.  Proximal 1st Mrg branch is patent but diffusely diseased and calcified with a severe kink in what appears to be a previous anastomosis site for graft.   Ost RCA to Dist RCA lesion is 100% stenosed with 100% stenosed side branch in RPAV.   -------------------------------- GRAFTS -------------------------------   LIMA-Diag(LAD) graft was visualized by angiography and is normal in caliber. -The graft appears to the very proximal portion of the diagonal branch with retrograde filling of the LAD. The graft exhibits no disease.   SVG-OM & SVG-LAD grafts were not visualized due to known occlusion.  Both with 100% ostial stenosis.SABRA   Seq SVG- RPDA -RPL graft was visualized by angiography and is large. Mid Graft lesion before RPDA is 99% stenosed.  (CULPRIT LESION)   Scoring balloon angioplasty was performed using a BALLOON SCOREFLEX 4.0X15, followed by successful placement of a STENT ONYX FRONTIER 4.0X18 deployed to 4.3 mm (postdilated with 4.5 mm x 15 mm [place] balloon to high atmospheres). Post intervention, there is a 0% residual stenosis.  (TIMI-3 flow maintained)   ----------------------- HEMODYNAMICS ---------------------------------   The aortic valve is not crossed-was  difficult to manipulate the catheter to allow crossing of the valve.  PROCEDURE:  Procedure(s): CORONARY/GRAFT ANGIOGRAPHY (N/A) CORONARY STENT INTERVENTION (N/A)  SURGEON:  Surgeons and Role:    * Anner Alm ORN, MD - Primary  PROCEDURE PERFORMED Time Out: Verified patient identification, verified procedure, site/side was marked, verified correct patient position, special equipment/implants available, medications/allergies/relevent history reviewed, required imaging and test results available. Performed.  Access:  LEFT Radial Artery: 6 Fr sheath -- Seldinger technique using Micropuncture Kit -- Direct ultrasound guidance used.  Permanent image obtained and placed on chart. -- 10 mL radial cocktail IA; 4000 Units IV Heparin  Left Heart Catheterization: 5Fr Catheters advanced or exchanged over a J-wire under direct fluoroscopic guidance into the ascending aorta; JR4 catheter advanced first.  * LV Hemodynamics (LV Gram): Unable to cross the aortic valve based on angle * Left Coronary Artery Cineangiography: JL 4 catheter  * Right Coronary Artery was not engaged as it was known to be occluded * SeqSVG-RPDA-RPL Cineangiography: 5 French JR4 diagnostic catheter, 5 French AR-1 guide catheter  * LIMA-LAD Cineangiography: JR4 catheter was redirected into Left Subclavian Artery used to engage the IMA graft  Review of initial angiography revealed: Patent LIMA to diagonal actually backfills the LAD, native LAD is clearly occluded and native RCA known to be occluded.  Native LCx is diffusely diseased and courses mostly as a OM1 branch that has diffuse disease throughout but not amenable to PCI. Culprit lesion is 99% mid SeqSVG-PDA-RPL, there is downstream 50% eccentric lesion in the sequential limb.  Preparations are made for PCI of the sequential SVG  graft. - Additional 4000 units IV heparin administered along with 600 mg of oral Plavix ; an ACT> 250 seconds ensured - 2 aliquots of 6 mcg IC  adenosine administered  COMPLEX/DIFFICULT SVT PCI PERFORMED ( due to severity of stenosis, distal protection not able to be used.) - 6 French guide would not advance, therefore a 5 French AR-1 guide was used, initially Research scientist (physical sciences) followed by [employer] wire for buddy wire to allow for stent passing.  Initial PTCA was with a Takeru 2.0 mm x 12 mm balloon, followed by a 3.0 x 12 mm balloon and then a 4.0 x 15 mm score flex angioplasty balloon inflated to high atmospheres.  Despite this was still unable to pass stent.  I therefore went with a 4.5 mm x 12 mm [employer] balloon inflated to 14 atm and finally had expansion of the original lesion. = A 4.0 x 18 mm Onyx frontier DES stent was> placed covering the entire lesion that was treated with a score flex it was deployed to high atmospheres (4.3 mm and postdilated with the 4.5 mm [place] balloon to 22 atm with a small 5 to 10% waist in the focal area but the rest was full expanded.  TIMI-3 flow was maintained throughout the entire procedure there was no evidence of no reflow.  Upon completion of Angiogaphy, the catheter was removed completely out of the body over a wire, without complication.  Radial sheath removed in the Cardiac Catheterization lab with TR Band placed for hemostasis.  TR Band: 1725 Hours; 11 mL air  MEDICATIONS SQ Lidocaine  3 mL Radial Cocktail: 3 mg Verapmil in 10 mL NS Heparin: Total 9000 units IC adenosine 12 mcg   EBL:  <50 mL   PATIENT DISPOSITION:  PACU - hemodynamically stable.   PLAN OF CARE: Admit to inpatient  -DAPT of ASA/Plavix x 1 year uninterrupted.  If necessary could interrupt aspirin  at 6 months.  Would continue Plavix long-term to complete second year.    DICTATION: .Via cath chart    Alm MICAEL Clay, MD, MS Alm Clay, M.D., M.S. Interventional Cardiologist  [facility] Pager # [phone]

## 2023-11-20 ENCOUNTER — Other Ambulatory Visit (HOSPITAL_COMMUNITY): Payer: Self-pay

## 2023-11-20 ENCOUNTER — Encounter (HOSPITAL_COMMUNITY): Payer: Self-pay | Admitting: Cardiology

## 2023-11-20 DIAGNOSIS — I214 Non-ST elevation (NSTEMI) myocardial infarction: Secondary | ICD-10-CM | POA: Diagnosis not present

## 2023-11-20 LAB — BASIC METABOLIC PANEL WITH GFR
Anion gap: 9 (ref 5–15)
BUN: 17 mg/dL (ref 8–23)
CO2: 23 mmol/L (ref 22–32)
Calcium: 8.5 mg/dL — ABNORMAL LOW (ref 8.9–10.3)
Chloride: 103 mmol/L (ref 98–111)
Creatinine, Ser: 1.13 mg/dL (ref 0.61–1.24)
GFR, Estimated: >60 mL/min (ref >60)
Glucose, Bld: 113 mg/dL — ABNORMAL HIGH (ref 70–99)
Potassium: 3.8 mmol/L (ref 3.5–5.1)
Sodium: 135 mmol/L (ref 135–145)

## 2023-11-20 LAB — CBC
HCT: 38.1 % — ABNORMAL LOW (ref 39.0–52.0)
Hemoglobin: 13.4 g/dL (ref 13.0–17.0)
MCH: 34.9 pg — ABNORMAL HIGH (ref 26.0–34.0)
MCHC: 35.2 g/dL (ref 30.0–36.0)
MCV: 99.2 fL (ref 80.0–100.0)
Platelets: 126 K/uL — ABNORMAL LOW (ref 150–400)
RBC: 3.84 MIL/uL — ABNORMAL LOW (ref 4.22–5.81)
RDW: 13.9 % (ref 11.5–15.5)
WBC: 5.8 K/uL (ref 4.0–10.5)
nRBC: 0 % (ref 0.0–0.2)

## 2023-11-20 MED ORDER — EZETIMIBE 10 MG PO TABS
10.0000 mg | ORAL_TABLET | Freq: Every day | ORAL | 0 refills | Status: AC
  Filled 2023-11-20: qty 30, 30d supply, fill #0

## 2023-11-20 MED ORDER — CLOPIDOGREL BISULFATE 75 MG PO TABS
75.0000 mg | ORAL_TABLET | Freq: Every day | ORAL | 3 refills | Status: DC
  Filled 2023-11-20: qty 30, 30d supply, fill #0

## 2023-11-20 NOTE — Progress Notes (Signed)
 DISCHARGE NOTE HOME JEDD [place] to be discharged Home per MD order. Floor RN Discussed prescriptions and follow up appointments with the patient. Prescriptions given to patient; medication list explained in detail. Patient verbalized understanding.  Skin clean, dry and intact without evidence of skin break down, no evidence of skin tears noted. IV catheter discontinued intact. Site without signs and symptoms of complications. Dressing and pressure applied. Pt denies pain at the site currently. No complaints noted.  Patient free of lines, drains, and wounds.   An After Visit Summary (AVS) was printed and given to the patient. Picked up TOC meds on way out Patient escorted via wheelchair, and discharged home via private auto.  Peyton SHAUNNA Pepper, RN

## 2023-11-20 NOTE — Discharge Summary (Signed)
 Physician Discharge Summary  Jacob Weber FMW:[id number] DOB: Sep 27, 1940 DOA: 11/18/2023  PCP: Ofilia Lamar LITTIE, MD  Admit date: 11/18/2023 Discharge date: 11/20/2023  Time spent:45 minutes  Recommendations for Outpatient Follow-up:  Cardiology Dr. Monetta in 2 to 3 weeks   Discharge Diagnoses:  Principal Problem:   NSTEMI (non-ST elevated myocardial infarction) [facility]) Active Problems:   Coronary artery disease involving native coronary artery of native heart with angina pectoris   Chronic combined systolic and diastolic heart failure (HCC)   Ascending aortic aneurysm   Hypertension   Mixed hyperlipidemia   Acquired hypothyroidism   GERD (gastroesophageal reflux disease)   Emphysema of lung (HCC)   Essential tremor   Hyperlipidemia   Glaucoma   Discharge Condition: Improved  Diet recommendation: Heart healthy  Filed Weights   11/18/23 2116 11/19/23 0346 11/19/23 1844  Weight: 79.7 kg 79.5 kg 80.6 kg    History of present illness:  83/M with history of chronic diastolic CHF, CAD/CABG, ascending aortic aneurysm, hypertension, COPD, IBS, trigeminal neuralgia, hypothyroidism presented to the ED with chest pain. - Troponin trended up from 100s up to 2074, started on IV heparin  Hospital Course:   NSTEMI CAD/CABG - Troponin peaked at 2074 -Continue IV heparin today, continue aspirin , starting Zetia  -Follow-up echo, intolerant to statins -Cards following, underwent LHC yesterday which showed 99% occlusion of sequential SVG graft, this was stented, overall improved and stable, discharged home on aspirin  Plavix and Coreg , Zetia  started he is intolerant to statins -Follow-up with [facility] heart [facility], discussed PCSK9 inhibitors at follow-up   Hypertension - Stable, continue Coreg    Hyperlipidemia - Not on statin as above   GERD - Continue PPI   Hypothyroidism - Continue Synthroid    Ascending aortic aneurysm - Stable on imaging   Chronic combined systolic and  diastolic CHF > Echo in 2021 with EF 50 to 55%, G1 DD, normal RV function. -repeat echo noted EF of 40-45%, indeterminate diastolic function, normal RV, remained euvolemic throughout this admission, continue Coreg , Lasix   - Consider further GDMT at follow-up depending on volume status   Glaucoma - Continue home eyedrops   Procedure Left Heart Catheterization: 5Fr Catheters advanced or exchanged over a J-wire under direct fluoroscopic guidance into the ascending aorta; JR4 catheter advanced first.  * LV Hemodynamics (LV Gram): Unable to cross the aortic valve based on angle * Left Coronary Artery Cineangiography: JL 4 catheter  * Right Coronary Artery was not engaged as it was known to be occluded * SeqSVG-RPDA-RPL Cineangiography: 5 French JR4 diagnostic catheter, 5 French AR-1 guide catheter  * LIMA-LAD Cineangiography: JR4 catheter was redirected into Left Subclavian Artery used to engage the IMA graft   Review of initial angiography revealed: Patent LIMA to diagonal actually backfills the LAD, native LAD is clearly occluded and native RCA known to be occluded.  Native LCx is diffusely diseased and courses mostly as a OM1 branch that has diffuse disease throughout but not amenable to PCI. Culprit lesion is 99% mid SeqSVG-PDA-RPL, there is downstream 50% eccentric lesion in the sequential limb.   Preparations are made for PCI of the sequential SVG graft. - Additional 4000 units IV heparin administered along with 600 mg of oral Plavix ; an ACT> 250 seconds ensured - 2 aliquots of 6 mcg IC adenosine administered   COMPLEX/DIFFICULT SVT PCI PERFORMED ( due to severity of stenosis, distal protection not able to be used.) - 6 French guide would not advance, therefore a 5 French AR-1 guide was used, initially  Prowater wire followed by BMW wire for buddy wire to allow for stent passing.  Initial PTCA was with a Takeru 2.0 mm x 12 mm balloon, followed by a 3.0 x 12 mm balloon and then a 4.0 x 15 mm  score flex angioplasty balloon inflated to high atmospheres.  Despite this was still unable to pass stent.  I therefore went with a 4.5 mm x 12 mm [place] balloon inflated to 14 atm and finally had expansion of the original lesion. = A 4.0 x 18 mm Onyx frontier DES stent was> placed covering the entire lesion that was treated with a score flex it was deployed to high atmospheres (4.3 mm and postdilated with the 4.5 mm [place] balloon to 22 atm with a small 5 to 10% waist in the focal area but the rest was full expanded.  Discharge Exam: Vitals:   11/20/23 0415 11/20/23 0715  BP: 137/66 106/63  Pulse: 80 68  Resp: 16 16  Temp: (!) 97.4 F (36.3 C) (!) 97.5 F (36.4 C)  SpO2: 96% 98%   Gen: Awake, Alert, Oriented X 3,  HEENT: no JVD Lungs: Good air movement bilaterally, CTAB CVS: S1S2/RRR Abd: soft, Non tender, non distended, BS present Extremities: No edema Skin: no new rashes on exposed skin   Discharge Instructions   Discharge Instructions     Amb Referral to Cardiac Rehabilitation   Complete by: As directed    To [place], however pt not interested   Diagnosis:  Coronary Stents NSTEMI PTCA     After initial evaluation and assessments completed: Virtual Based Care may be provided alone or in conjunction with Phase 2 Cardiac Rehab based on patient barriers.: Yes   Intensive Cardiac Rehabilitation (ICR) [facility] location only OR Traditional Cardiac Rehabilitation (TCR) *If criteria for ICR are not met will enroll in TCR [facility] only): Yes   Diet - low sodium heart healthy   Complete by: As directed    Increase activity slowly   Complete by: As directed       Allergies as of 11/20/2023       Reactions   Ace Inhibitors Other (See Comments)   Muscle aches, cough Product containing angiotensin-converting enzyme inhibitor (product)   Rosuvastatin    Other reaction(s): Muscle pain   Statins Other (See Comments)   Felt bad        Medication List     TAKE these medications     allopurinol  100 MG tablet Commonly known as: ZYLOPRIM  Take 100 mg by mouth at bedtime.   allopurinol  300 MG tablet Commonly known as: ZYLOPRIM  Take 300 mg by mouth in the morning.   aspirin  81 MG chewable tablet Chew 81 mg by mouth daily.   bimatoprost 0.03 % ophthalmic solution Commonly known as: LUMIGAN Place 1 drop into both eyes at bedtime.   carvedilol  25 MG tablet Commonly known as: COREG  Take 0.5 tablets (12.5 mg total) by mouth 2 (two) times daily.   clopidogrel 75 MG tablet Commonly known as: PLAVIX Take 1 tablet (75 mg total) by mouth daily with breakfast. Start taking on: November 21, 2023   colchicine 0.6 MG tablet Take 0.6 mg by mouth 2 (two) times daily as needed (Gout).   ezetimibe  10 MG tablet Commonly known as: ZETIA  Take 1 tablet (10 mg total) by mouth daily. Start taking on: November 21, 2023   fluticasone 50 MCG/ACT nasal spray Commonly known as: FLONASE Place 1 spray into both nostrils daily as needed for allergies or rhinitis.  furosemide  40 MG tablet Commonly known as: LASIX  TAKE ONE TABLET BY MOUTH ONCE DAILY   levothyroxine  100 MCG tablet Commonly known as: SYNTHROID  Take 100 mcg by mouth daily.   nitroGLYCERIN 0.4 MG SL tablet Commonly known as: NITROSTAT Place 0.4 mg under the tongue every 5 (five) minutes as needed for chest pain.   polyethylene glycol 17 g packet Commonly known as: MIRALAX  / GLYCOLAX  Take 17 g by mouth 2 (two) times daily. What changed:  when to take this reasons to take this   potassium chloride  SA 20 MEQ tablet Commonly known as: KLOR-CON  M TAKE ONE TABLET BY MOUTH ONCE DAILY   TAGAMET PO Take 1-5 tablets by mouth 4 (four) times daily as needed (Indigestion/Acid reflux).   VITAMIN B 12 PO Take 1 tablet by mouth daily as needed (Supplement).   VITAMIN D  PO Take 1 tablet by mouth at bedtime.       Allergies  Allergen Reactions   Ace Inhibitors Other (See Comments)    Muscle aches,  cough  Product containing angiotensin-converting enzyme inhibitor (product)   Rosuvastatin     Other reaction(s): Muscle pain   Statins Other (See Comments)    Felt bad      The results of significant diagnostics from this hospitalization (including imaging, microbiology, ancillary and laboratory) are listed below for reference.    Significant Diagnostic Studies: CARDIAC CATHETERIZATION Result Date: 11/20/2023 Images from the original result were not included.   -------------------------------- NATIVE CORONARIES -------------------------------   Ost LAD to Prox LAD lesion is 50% stenosed. Prox LAD to Mid LAD lesion is 100% stenosed.   Mid Cx lesion is 60% stenosed.  Proximal 1st Mrg branch is patent but diffusely diseased and calcified with a severe kink in what appears to be a previous anastomosis site for graft.   Ost RCA to Dist RCA lesion is 100% stenosed with 100% stenosed side branch in RPAV.   -------------------------------- GRAFTS -------------------------------   LIMA-Diag(LAD) graft was visualized by angiography and is normal in caliber. -The graft appears to the very proximal portion of the diagonal branch with retrograde filling of the LAD. The graft exhibits no disease.   SVG-OM & SVG-LAD grafts were not visualized due to known occlusion.  Both with 100% ostial stenosis.SABRA   Seq SVG- RPDA -RPL graft was visualized by angiography and is large. Mid Graft lesion before RPDA is 99% stenosed.  (CULPRIT LESION)   Scoring balloon angioplasty was performed using a BALLOON SCOREFLEX 4.0X15, followed by successful placement of a STENT ONYX FRONTIER 4.0X18 deployed to 4.3 mm (postdilated with 4.5 mm x 15 mm [place] balloon to high atmospheres). Post intervention, there is a 0% residual stenosis.  (TIMI-3 flow maintained)   ----------------------- HEMODYNAMICS ---------------------------------   The aortic valve is not crossed-was difficult to manipulate the catheter to allow crossing of the valve.  Diagnostic Dominance: Right      Intervention PLAN OF CARE   Admit to monitor inpatient. In the absence of any other complications or medical issues, we expect the patient to be ready for discharge from an interventional cardiology perspective on 11/20/2023.   Recommend uninterrupted dual antiplatelet therapy with Aspirin  81mg  daily and Clopidogrel 75mg  daily for a minimum of 6 months (stable ischemic heart disease-Class I recommendation).   Would continue Plavix monotherapy to complete least 1 year if not additional second year. Alm MICAEL Clay, MD, MS Alm Clay, M.D., M.S. Interventional Cardiologist [facility] Pager # [phone]  ECHOCARDIOGRAM COMPLETE Result Date: 11/19/2023  ECHOCARDIOGRAM REPORT   Patient Name:   MAREK NGHIEM Date of Exam: 11/19/2023 Medical Rec #:  [id number]       Height:       74.0 in Accession #:    [id number]      Weight:       175.2 lb Date of Birth:  1941/01/01       BSA:          2.055 m Patient Age:    83 years        BP:           115/68 mmHg Patient Gender: M               HR:           71 bpm. Exam Location:  Inpatient Procedure: 2D Echo, Cardiac Doppler, Color Doppler and Intracardiac            Opacification Agent (Both Spectral and Color Flow Doppler were            utilized during procedure). Indications:    Chest Pain R07.9  History:        Patient has prior history of Echocardiogram examinations, most                 recent 08/21/2019. CHF and Cardiomyopathy; Risk                 Factors:Hypertension. H/O Ascending AO aneurysm, NSTEMI,                 Hyperlipidemia.  Sonographer:    BERNARDA ROCKS Referring Phys: [id number] ALEXANDER B MELVIN IMPRESSIONS  1. Left ventricular ejection fraction, by estimation, is 40 to 45%. The left ventricle has mildly decreased function. The left ventricle demonstrates global hypokinesis. The left ventricular internal cavity size was mildly dilated. Left ventricular diastolic parameters are indeterminate.  2. Right  ventricular systolic function is normal. The right ventricular size is mildly enlarged.  3. Left atrial size was mildly dilated.  4. The mitral valve is degenerative. No evidence of mitral valve regurgitation. No evidence of mitral stenosis.  5. The aortic valve is tricuspid. There is moderate calcification of the aortic valve. Aortic valve regurgitation is not visualized. Aortic valve sclerosis/calcification is present, without any evidence of aortic stenosis.  6. Aortic dilatation noted. There is mild dilatation of the aortic root, measuring 40 mm. There is mild dilatation of the ascending aorta, measuring 42 mm.  7. The inferior vena cava is normal in size with greater than 50% respiratory variability, suggesting right atrial pressure of 3 mmHg. FINDINGS  Left Ventricle: Left ventricular ejection fraction, by estimation, is 40 to 45%. The left ventricle has mildly decreased function. The left ventricle demonstrates global hypokinesis. Definity  contrast agent was given IV to delineate the left ventricular  endocardial borders. The left ventricular internal cavity size was mildly dilated. There is no left ventricular hypertrophy. Left ventricular diastolic parameters are indeterminate. Right Ventricle: The right ventricular size is mildly enlarged. No increase in right ventricular wall thickness. Right ventricular systolic function is normal. Left Atrium: Left atrial size was mildly dilated. Right Atrium: Right atrial size was normal in size. Pericardium: There is no evidence of pericardial effusion. Mitral Valve: The mitral valve is degenerative in appearance. There is moderate thickening of the mitral valve leaflet(s). No evidence of mitral valve regurgitation. No evidence of mitral valve stenosis. MV peak gradient, 7.5 mmHg. The mean mitral valve gradient is 4.0 mmHg. Tricuspid Valve: The  tricuspid valve is normal in structure. Tricuspid valve regurgitation is trivial. No evidence of tricuspid stenosis. Aortic  Valve: The aortic valve is tricuspid. There is moderate calcification of the aortic valve. Aortic valve regurgitation is not visualized. Aortic valve sclerosis/calcification is present, without any evidence of aortic stenosis. Aortic valve mean gradient measures 4.0 mmHg. Aortic valve peak gradient measures 8.8 mmHg. Aortic valve area, by VTI measures 3.11 cm. Pulmonic Valve: The pulmonic valve was normal in structure. Pulmonic valve regurgitation is not visualized. No evidence of pulmonic stenosis. Aorta: Aortic dilatation noted. There is mild dilatation of the aortic root, measuring 40 mm. There is mild dilatation of the ascending aorta, measuring 42 mm. Venous: The inferior vena cava is normal in size with greater than 50% respiratory variability, suggesting right atrial pressure of 3 mmHg. IAS/Shunts: No atrial level shunt detected by color flow Doppler.  LEFT VENTRICLE PLAX 2D LVIDd:         5.50 cm LVIDs:         3.70 cm LV PW:         0.80 cm LV IVS:        1.00 cm LVOT diam:     2.50 cm LV SV:         82 LV SV Index:   40 LVOT Area:     4.91 cm  LV Volumes (MOD) LV vol d, MOD A2C: 106.0 ml LV vol d, MOD A4C: 148.5 ml LV vol s, MOD A2C: 75.1 ml LV vol s, MOD A4C: 75.4 ml LV SV MOD A2C:     30.9 ml LV SV MOD A4C:     148.5 ml LV SV MOD BP:      50.6 ml RIGHT VENTRICLE          IVC RV Basal diam:  3.80 cm  IVC diam: 1.90 cm TAPSE (M-mode): 1.6 cm LEFT ATRIUM           Index        RIGHT ATRIUM           Index LA diam:      3.90 cm 1.90 cm/m   RA Area:     14.40 cm LA Vol (A2C): 66.5 ml 32.37 ml/m  RA Volume:   31.40 ml  15.28 ml/m LA Vol (A4C): 58.8 ml 28.62 ml/m  AORTIC VALVE                    PULMONIC VALVE AV Area (Vmax):    2.73 cm     PV Vmax:       0.73 m/s AV Area (Vmean):   2.78 cm     PV Peak grad:  2.1 mmHg AV Area (VTI):     3.11 cm AV Vmax:           148.00 cm/s AV Vmean:          88.600 cm/s AV VTI:            0.264 m AV Peak Grad:      8.8 mmHg AV Mean Grad:      4.0 mmHg LVOT Vmax:          82.30 cm/s LVOT Vmean:        50.100 cm/s LVOT VTI:          0.167 m LVOT/AV VTI ratio: 0.63  AORTA Ao Root diam: 4.00 cm Ao Asc diam:  4.20 cm MITRAL VALVE MV Area (PHT): 7.37 cm     SHUNTS  MV Area VTI:   3.18 cm     Systemic VTI:  0.17 m MV Peak grad:  7.5 mmHg     Systemic Diam: 2.50 cm MV Mean grad:  4.0 mmHg MV Vmax:       1.37 m/s MV Vmean:      86.7 cm/s MV Decel Time: 103 msec MV E velocity: 115.00 cm/s MV A velocity: 133.00 cm/s MV E/A ratio:  0.86 Toribio Fuel MD Electronically signed by Toribio Fuel MD Signature Date/Time: 11/19/2023/8:37:51 PM    Final    CT Angio Chest/Abd/Pel for Dissection W and/or Wo Contrast Result Date: 11/18/2023 CLINICAL DATA:  Chest pain, abdominal pain EXAM: CT ANGIOGRAPHY CHEST, ABDOMEN AND PELVIS TECHNIQUE: Non-contrast CT of the chest was initially obtained. Multidetector CT imaging through the chest, abdomen and pelvis was performed using the standard protocol during bolus administration of intravenous contrast. Multiplanar reconstructed images and MIPs were obtained and reviewed to evaluate the vascular anatomy. RADIATION DOSE REDUCTION: This exam was performed according to the departmental dose-optimization program which includes automated exposure control, adjustment of the mA and/or kV according to patient size and/or use of iterative reconstruction technique. CONTRAST:  75mL OMNIPAQUE IOHEXOL 350 MG/ML SOLN COMPARISON:  10/29/2023 FINDINGS: CTA CHEST FINDINGS Cardiovascular: 4.1 cm ascending thoracic aortic aneurysm. No evidence of thoracic aortic dissection. There is diffuse aortic atherosclerosis. Great vessels are widely patent. The heart is unremarkable without pericardial effusion. There is extensive atherosclerosis of the coronary vasculature. Postsurgical changes from prior CABG. There is technically adequate opacification of the central pulmonary vasculature. No evidence of central or segmental pulmonary emboli. Mediastinum/Nodes: No enlarged  mediastinal, hilar, or axillary lymph nodes. Thyroid gland, trachea, and esophagus demonstrate no significant findings. Lungs/Pleura: No acute airspace disease, effusion, or pneumothorax. Upper lobe predominant emphysema. Stable bibasilar scarring. Stable 7 mm left lower lobe nodule image 143/8, benign given long-term stability since at least 2020. No specific follow-up recommended. Central airways are patent. Musculoskeletal: No acute or destructive bony abnormalities. Right shoulder arthroplasty. Reconstructed images demonstrate no additional findings. Review of the MIP images confirms the above findings. CTA ABDOMEN AND PELVIS FINDINGS VASCULAR Aorta: Normal caliber aorta without aneurysm, dissection, vasculitis or significant stenosis. Diffuse atherosclerosis. Celiac: Patent without evidence of aneurysm, dissection, vasculitis or significant stenosis. SMA: Patent without evidence of aneurysm, dissection, vasculitis or significant stenosis. Renals: There is significant atherosclerosis at the origin of the bilateral renal arteries, right greater than left. On the right, estimated stenosis 50-70%, and on the left estimated less than 50%. No aneurysm, dissection, vasculitis, or fibromuscular dysplasia. IMA: Patent without evidence of aneurysm, dissection, vasculitis or significant stenosis. Inflow: Patent without evidence of aneurysm, dissection, vasculitis or significant stenosis. Veins: No obvious venous abnormality within the limitations of this arterial phase study. Review of the MIP images confirms the above findings. NON-VASCULAR Hepatobiliary: No focal liver abnormality is seen. Status post cholecystectomy. No biliary dilatation. Pancreas: Unremarkable. No pancreatic ductal dilatation or surrounding inflammatory changes. Spleen: Normal in size without focal abnormality. Adrenals/Urinary Tract: There is a 4 mm nonobstructing calculus at the right UVJ. There are 3 additional punctate less than 3 mm  nonobstructing right renal calculi. No left-sided calculi or obstruction. Mild bilateral renal cortical thinning. The adrenals and bladder are unremarkable. Stomach/Bowel: No bowel obstruction or ileus. No bowel wall thickening or inflammatory change. Lymphatic: No pathologic adenopathy. Reproductive: Prostate is unremarkable. Other: No free fluid or free intraperitoneal gas. No abdominal wall hernia. Musculoskeletal: Right hip arthroplasty. No acute or destructive bony abnormalities. Reconstructed  images demonstrate no additional findings. Review of the MIP images confirms the above findings. IMPRESSION: Vascular: 1. 4.1 cm ascending thoracic aortic aneurysm. Recommend annual imaging followup by CTA or MRA. This recommendation follows 2010 ACCF/AHA/AATS/ACR/ASA/SCA/SCAI/SIR/STS/SVM Guidelines for the Diagnosis and Management of Patients with Thoracic Aortic Disease. Circulation. 2010; 121: Z733-z630. Aortic aneurysm NOS (ICD10-I71.9) 2. No evidence of aortic dissection. 3. No evidence of pulmonary embolus. 4. 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 5.  Aortic Atherosclerosis (ICD10-I70.0). Nonvascular: 1. Nonobstructing 4 mm right UVJ calculus. Other punctate nonobstructing right renal calculi as above. 2.  Emphysema (ICD10-J43.9). Electronically Signed   By: Ozell Daring M.D.   On: 11/18/2023 15:14   DG Chest 2 View Result Date: 11/18/2023 EXAM: 2 VIEW(S) XRAY OF THE CHEST 11/18/2023 01:28:00 PM COMPARISON: 01/03/2022. CLINICAL HISTORY: Chest pain. FINDINGS: LUNGS AND PLEURA: Subsegmental linear atelectasis vs scarring at the left lung base. No focal pulmonary opacity. No overt pulmonary edema. No pleural effusion. No pneumothorax. HEART AND MEDIASTINUM: No acute abnormality of the cardiac and mediastinal silhouettes. Aortic atherosclerosis. Prior median sternotomy and CABG. BONES AND SOFT TISSUES: Right reverse shoulder arthroplasty. Diffuse osseous demineralization. IMPRESSION:  1. Subsegmental linear atelectasis vs scarring at the left lung base. Otherwise, no acute cardiopulmonary findings. 2. Aortic atherosclerosis. Electronically signed by: Shahmeer Marcelino MD 11/18/2023 02:01 PM EDT RP Workstation: [id number]   CT Chest Wo Contrast Result Date: 11/02/2023 CLINICAL DATA:  Aortic aneurysm suspected, coronary artery disease EXAM: CT CHEST WITHOUT CONTRAST TECHNIQUE: Multidetector CT imaging of the chest was performed following the standard protocol without IV contrast. RADIATION DOSE REDUCTION: This exam was performed according to the departmental dose-optimization program which includes automated exposure control, adjustment of the mA and/or kV according to patient size and/or use of iterative reconstruction technique. COMPARISON:  01/23/2022 FINDINGS: Cardiovascular: Unchanged enlargement of the tubular ascending thoracic aorta measuring up to 4.3 x 4.3 cm (series 301, image 66). Aortic valve calcifications. Severe atherosclerosis of the descending thoracic aorta, normal in caliber. Normal heart size. Extensive three-vessel coronary artery calcifications and stents. No pericardial effusion. Mediastinum/Nodes: No enlarged mediastinal, hilar, or axillary lymph nodes. Thyroid gland, trachea, and esophagus demonstrate no significant findings. Lungs/Pleura: Mild centrilobular and paraseptal emphysema. Diffuse bilateral bronchial wall thickening. Dependent bibasilar scarring or fibrosis. No pleural effusion or pneumothorax. Upper Abdomen: No acute abnormality.  Cholecystectomy. Musculoskeletal: No chest wall abnormality. No acute osseous findings. Right shoulder reverse arthroplasty. IMPRESSION: 1. Unchanged enlargement of the tubular ascending thoracic aorta measuring up to 4.3 x 4.3 cm. Recommend annual imaging followup by CTA or MRA if not otherwise imaged and if clinically appropriate. 2. Aortic valve calcifications. Correlate for echocardiographic evidence of aortic valve dysfunction.  3. Extensive three-vessel coronary artery calcifications and stents. 4. Emphysema and diffuse bilateral bronchial wall thickening. Aortic Atherosclerosis (ICD10-I70.0) and Emphysema (ICD10-J43.9). Electronically Signed   By: Marolyn JONETTA Jaksch M.D.   On: 11/02/2023 10:58    Microbiology: No results found for this or any previous visit (from the past 240 hours).   Labs: Basic Metabolic Panel: Recent Labs  Lab 11/18/23 1311 11/18/23 2332 11/19/23 0646 11/20/23 0542  NA 136  137 137 136 135  K 4.4  4.4 3.8 3.8 3.8  CL 101  102 100 103 103  CO2 20*  23 23 22 23   GLUCOSE 125*  127* 107* 117* 113*  BUN 22  22 21 21 17   CREATININE 1.43*  1.40* 1.31* 1.23 1.13  CALCIUM 9.0  9.0 8.7* 8.4* 8.5*  MG  --  2.2  --   --    Liver Function Tests: Recent Labs  Lab 11/18/23 1311  AST 27  ALT 16  ALKPHOS 43  BILITOT 1.1  PROT 7.0  ALBUMIN 3.8   Recent Labs  Lab 11/18/23 1311  LIPASE 29   No results for input(s): AMMONIA in the last 168 hours. CBC: Recent Labs  Lab 11/18/23 1311 11/18/23 2332 11/20/23 0542  WBC 7.8 6.2 5.8  HGB 13.8 13.1 13.4  HCT 40.2 36.5* 38.1*  MCV 100.8* 98.9 99.2  PLT 148* 132* 126*   Cardiac Enzymes: No results for input(s): CKTOTAL, CKMB, CKMBINDEX, TROPONINI in the last 168 hours. BNP: BNP (last 3 results) Recent Labs    11/18/23 1739  BNP 125.1*    ProBNP (last 3 results) No results for input(s): PROBNP in the last 8760 hours.  CBG: No results for input(s): GLUCAP in the last 168 hours.     Signed:  Sigurd Pac MD.  [facility] 11/20/2023, 10:50 AM

## 2023-11-20 NOTE — Plan of Care (Signed)
  Problem: Education: Goal: Understanding of cardiac disease, CV risk reduction, and recovery process will improve Outcome: Adequate for Discharge Goal: Individualized Educational Video(s) Outcome: Adequate for Discharge   Problem: Activity: Goal: Ability to tolerate increased activity will improve Outcome: Adequate for Discharge   Problem: Cardiac: Goal: Ability to achieve and maintain adequate cardiovascular perfusion will improve Outcome: Adequate for Discharge   Problem: Health Behavior/Discharge Planning: Goal: Ability to safely manage health-related needs after discharge will improve Outcome: Adequate for Discharge   Problem: Education: Goal: Knowledge of General Education information will improve Description: Including pain rating scale, medication(s)/side effects and non-pharmacologic comfort measures Outcome: Adequate for Discharge   Problem: Health Behavior/Discharge Planning: Goal: Ability to manage health-related needs will improve Outcome: Adequate for Discharge   Problem: Clinical Measurements: Goal: Ability to maintain clinical measurements within normal limits will improve Outcome: Adequate for Discharge Goal: Will remain free from infection Outcome: Adequate for Discharge Goal: Diagnostic test results will improve Outcome: Adequate for Discharge Goal: Respiratory complications will improve Outcome: Adequate for Discharge Goal: Cardiovascular complication will be avoided Outcome: Adequate for Discharge   Problem: Activity: Goal: Risk for activity intolerance will decrease Outcome: Adequate for Discharge   Problem: Nutrition: Goal: Adequate nutrition will be maintained Outcome: Adequate for Discharge   Problem: Coping: Goal: Level of anxiety will decrease Outcome: Adequate for Discharge   Problem: Elimination: Goal: Will not experience complications related to bowel motility Outcome: Adequate for Discharge Goal: Will not experience complications  related to urinary retention Outcome: Adequate for Discharge   Problem: Pain Managment: Goal: General experience of comfort will improve and/or be controlled Outcome: Adequate for Discharge   Problem: Safety: Goal: Ability to remain free from injury will improve Outcome: Adequate for Discharge   Problem: Skin Integrity: Goal: Risk for impaired skin integrity will decrease Outcome: Adequate for Discharge   Problem: Education: Goal: Understanding of CV disease, CV risk reduction, and recovery process will improve Outcome: Adequate for Discharge Goal: Individualized Educational Video(s) Outcome: Adequate for Discharge   Problem: Activity: Goal: Ability to return to baseline activity level will improve Outcome: Adequate for Discharge   Problem: Cardiovascular: Goal: Ability to achieve and maintain adequate cardiovascular perfusion will improve Outcome: Adequate for Discharge Goal: Vascular access site(s) Level 0-1 will be maintained Outcome: Adequate for Discharge   Problem: Health Behavior/Discharge Planning: Goal: Ability to safely manage health-related needs after discharge will improve Outcome: Adequate for Discharge   Problem: Education: Goal: Understanding of CV disease, CV risk reduction, and recovery process will improve Outcome: Adequate for Discharge Goal: Individualized Educational Video(s) Outcome: Adequate for Discharge   Problem: Activity: Goal: Ability to return to baseline activity level will improve Outcome: Adequate for Discharge   Problem: Cardiovascular: Goal: Ability to achieve and maintain adequate cardiovascular perfusion will improve Outcome: Adequate for Discharge Goal: Vascular access site(s) Level 0-1 will be maintained Outcome: Adequate for Discharge   Problem: Health Behavior/Discharge Planning: Goal: Ability to safely manage health-related needs after discharge will improve Outcome: Adequate for Discharge

## 2023-11-20 NOTE — Progress Notes (Signed)
 Progress Note  Patient Name: Jacob Weber Date of Encounter: 11/20/2023  Primary Cardiologist: Redell Leiter, MD   Subjective   Patient seen examined at his bedside.  He was up in packing his things when I arrived.  He offers no complaints at this time.  Inpatient Medications    Scheduled Meds:  allopurinol   100 mg Oral QHS   allopurinol   300 mg Oral q AM   alum & mag hydroxide-simeth  30 mL Oral Once   aspirin   81 mg Oral Daily   carvedilol   12.5 mg Oral BID   clopidogrel  75 mg Oral Q breakfast   ezetimibe   10 mg Oral Daily   latanoprost  1 drop Both Eyes QHS   levothyroxine   100 mcg Oral Q0600   sodium chloride  flush  3 mL Intravenous Q12H   Continuous Infusions:  sodium chloride      PRN Meds: sodium chloride , acetaminophen , morphine  injection, ondansetron  (ZOFRAN ) IV, pantoprazole , sodium chloride  flush   Vital Signs    Vitals:   11/19/23 2100 11/20/23 0048 11/20/23 0415 11/20/23 0715  BP: 110/63 113/68 137/66 106/63  Pulse: 66 68 80 68  Resp:  15 16 16   Temp:  (!) 97.4 F (36.3 C) (!) 97.4 F (36.3 C) (!) 97.5 F (36.4 C)  TempSrc:  Oral Oral Oral  SpO2: 99% 99% 96% 98%  Weight:      Height:        Intake/Output Summary (Last 24 hours) at 11/20/2023 0935 Last data filed at 11/20/2023 0700 Gross per 24 hour  Intake 586.02 ml  Output 1025 ml  Net -438.98 ml   Filed Weights   11/18/23 2116 11/19/23 0346 11/19/23 1844  Weight: 79.7 kg 79.5 kg 80.6 kg    Telemetry     - Personally Reviewed  ECG     - Personally Reviewed  Physical Exam   General: Comfortable, sitting up in a chair Head: Atraumatic, normal size  Eyes: PEERLA, EOMI  Neck: Supple, normal JVD Cardiac: Normal S1, S2; RRR; no murmurs, rubs, or gallops Lungs: Clear to auscultation bilaterally Abd: Soft, nontender, no hepatomegaly  Ext: warm, no edema Musculoskeletal: No deformities, BUE and BLE strength normal and equal Skin: Warm and dry, no rashes   Neuro: Alert and  oriented to person, place, time, and situation, CNII-XII grossly intact, no focal deficits  Psych: Normal mood and affect   Labs    Chemistry Recent Labs  Lab 11/18/23 1311 11/18/23 2332 11/19/23 0646 11/20/23 0542  NA 136  137 137 136 135  K 4.4  4.4 3.8 3.8 3.8  CL 101  102 100 103 103  CO2 20*  23 23 22 23   GLUCOSE 125*  127* 107* 117* 113*  BUN 22  22 21 21 17   CREATININE 1.43*  1.40* 1.31* 1.23 1.13  CALCIUM 9.0  9.0 8.7* 8.4* 8.5*  PROT 7.0  --   --   --   ALBUMIN 3.8  --   --   --   AST 27  --   --   --   ALT 16  --   --   --   ALKPHOS 43  --   --   --   BILITOT 1.1  --   --   --   GFRNONAA 48*  50* 54* 58* >60  ANIONGAP 15  12 14 11 9      Hematology Recent Labs  Lab 11/18/23 1311 11/18/23 2332 11/20/23 0542  WBC 7.8  6.2 5.8  RBC 3.99* 3.69* 3.84*  HGB 13.8 13.1 13.4  HCT 40.2 36.5* 38.1*  MCV 100.8* 98.9 99.2  MCH 34.6* 35.5* 34.9*  MCHC 34.3 35.9 35.2  RDW 13.6 13.6 13.9  PLT 148* 132* 126*    Cardiac EnzymesNo results for input(s): TROPONINI in the last 168 hours. No results for input(s): TROPIPOC in the last 168 hours.   BNP Recent Labs  Lab 11/18/23 1739  BNP 125.1*     DDimer No results for input(s): DDIMER in the last 168 hours.   Radiology    CARDIAC CATHETERIZATION Result Date: 11/20/2023 Images from the original result were not included.   -------------------------------- NATIVE CORONARIES -------------------------------   Ost LAD to Prox LAD lesion is 50% stenosed. Prox LAD to Mid LAD lesion is 100% stenosed.   Mid Cx lesion is 60% stenosed.  Proximal 1st Mrg branch is patent but diffusely diseased and calcified with a severe kink in what appears to be a previous anastomosis site for graft.   Ost RCA to Dist RCA lesion is 100% stenosed with 100% stenosed side branch in RPAV.   -------------------------------- GRAFTS -------------------------------   LIMA-Diag(LAD) graft was visualized by angiography and is normal in  caliber. -The graft appears to the very proximal portion of the diagonal branch with retrograde filling of the LAD. The graft exhibits no disease.   SVG-OM & SVG-LAD grafts were not visualized due to known occlusion.  Both with 100% ostial stenosis.SABRA   Seq SVG- RPDA -RPL graft was visualized by angiography and is large. Mid Graft lesion before RPDA is 99% stenosed.  (CULPRIT LESION)   Scoring balloon angioplasty was performed using a BALLOON SCOREFLEX 4.0X15, followed by successful placement of a STENT ONYX FRONTIER 4.0X18 deployed to 4.3 mm (postdilated with 4.5 mm x 15 mm [place] balloon to high atmospheres). Post intervention, there is a 0% residual stenosis.  (TIMI-3 flow maintained)   ----------------------- HEMODYNAMICS ---------------------------------   The aortic valve is not crossed-was difficult to manipulate the catheter to allow crossing of the valve. Diagnostic Dominance: Right      Intervention PLAN OF CARE   Admit to monitor inpatient. In the absence of any other complications or medical issues, we expect the patient to be ready for discharge from an interventional cardiology perspective on 11/20/2023.   Recommend uninterrupted dual antiplatelet therapy with Aspirin  81mg  daily and Clopidogrel 75mg  daily for a minimum of 6 months (stable ischemic heart disease-Class I recommendation).   Would continue Plavix monotherapy to complete least 1 year if not additional second year. Alm MICAEL Clay, MD, MS Alm Clay, M.D., M.S. Interventional Cardiologist [facility] Pager # [phone]  ECHOCARDIOGRAM COMPLETE Result Date: 11/19/2023    ECHOCARDIOGRAM REPORT   Patient Name:   Jacob Weber Date of Exam: 11/19/2023 Medical Rec #:  [id number]       Height:       74.0 in Accession #:    [id number]      Weight:       175.2 lb Date of Birth:  29-Nov-1940       BSA:          2.055 m Patient Age:    83 years        BP:           115/68 mmHg Patient Gender: M               HR:           71 bpm. Exam  Location:  Inpatient Procedure: 2D Echo, Cardiac Doppler, Color Doppler and Intracardiac            Opacification Agent (Both Spectral and Color Flow Doppler were            utilized during procedure). Indications:    Chest Pain R07.9  History:        Patient has prior history of Echocardiogram examinations, most                 recent 08/21/2019. CHF and Cardiomyopathy; Risk                 Factors:Hypertension. H/O Ascending AO aneurysm, NSTEMI,                 Hyperlipidemia.  Sonographer:    BERNARDA ROCKS Referring Phys: [id number] ALEXANDER B MELVIN IMPRESSIONS  1. Left ventricular ejection fraction, by estimation, is 40 to 45%. The left ventricle has mildly decreased function. The left ventricle demonstrates global hypokinesis. The left ventricular internal cavity size was mildly dilated. Left ventricular diastolic parameters are indeterminate.  2. Right ventricular systolic function is normal. The right ventricular size is mildly enlarged.  3. Left atrial size was mildly dilated.  4. The mitral valve is degenerative. No evidence of mitral valve regurgitation. No evidence of mitral stenosis.  5. The aortic valve is tricuspid. There is moderate calcification of the aortic valve. Aortic valve regurgitation is not visualized. Aortic valve sclerosis/calcification is present, without any evidence of aortic stenosis.  6. Aortic dilatation noted. There is mild dilatation of the aortic root, measuring 40 mm. There is mild dilatation of the ascending aorta, measuring 42 mm.  7. The inferior vena cava is normal in size with greater than 50% respiratory variability, suggesting right atrial pressure of 3 mmHg. FINDINGS  Left Ventricle: Left ventricular ejection fraction, by estimation, is 40 to 45%. The left ventricle has mildly decreased function. The left ventricle demonstrates global hypokinesis. Definity  contrast agent was given IV to delineate the left ventricular  endocardial borders. The left ventricular internal  cavity size was mildly dilated. There is no left ventricular hypertrophy. Left ventricular diastolic parameters are indeterminate. Right Ventricle: The right ventricular size is mildly enlarged. No increase in right ventricular wall thickness. Right ventricular systolic function is normal. Left Atrium: Left atrial size was mildly dilated. Right Atrium: Right atrial size was normal in size. Pericardium: There is no evidence of pericardial effusion. Mitral Valve: The mitral valve is degenerative in appearance. There is moderate thickening of the mitral valve leaflet(s). No evidence of mitral valve regurgitation. No evidence of mitral valve stenosis. MV peak gradient, 7.5 mmHg. The mean mitral valve gradient is 4.0 mmHg. Tricuspid Valve: The tricuspid valve is normal in structure. Tricuspid valve regurgitation is trivial. No evidence of tricuspid stenosis. Aortic Valve: The aortic valve is tricuspid. There is moderate calcification of the aortic valve. Aortic valve regurgitation is not visualized. Aortic valve sclerosis/calcification is present, without any evidence of aortic stenosis. Aortic valve mean gradient measures 4.0 mmHg. Aortic valve peak gradient measures 8.8 mmHg. Aortic valve area, by VTI measures 3.11 cm. Pulmonic Valve: The pulmonic valve was normal in structure. Pulmonic valve regurgitation is not visualized. No evidence of pulmonic stenosis. Aorta: Aortic dilatation noted. There is mild dilatation of the aortic root, measuring 40 mm. There is mild dilatation of the ascending aorta, measuring 42 mm. Venous: The inferior vena cava is normal in size with greater than 50% respiratory variability, suggesting right atrial pressure of  3 mmHg. IAS/Shunts: No atrial level shunt detected by color flow Doppler.  LEFT VENTRICLE PLAX 2D LVIDd:         5.50 cm LVIDs:         3.70 cm LV PW:         0.80 cm LV IVS:        1.00 cm LVOT diam:     2.50 cm LV SV:         82 LV SV Index:   40 LVOT Area:     4.91 cm  LV  Volumes (MOD) LV vol d, MOD A2C: 106.0 ml LV vol d, MOD A4C: 148.5 ml LV vol s, MOD A2C: 75.1 ml LV vol s, MOD A4C: 75.4 ml LV SV MOD A2C:     30.9 ml LV SV MOD A4C:     148.5 ml LV SV MOD BP:      50.6 ml RIGHT VENTRICLE          IVC RV Basal diam:  3.80 cm  IVC diam: 1.90 cm TAPSE (M-mode): 1.6 cm LEFT ATRIUM           Index        RIGHT ATRIUM           Index LA diam:      3.90 cm 1.90 cm/m   RA Area:     14.40 cm LA Vol (A2C): 66.5 ml 32.37 ml/m  RA Volume:   31.40 ml  15.28 ml/m LA Vol (A4C): 58.8 ml 28.62 ml/m  AORTIC VALVE                    PULMONIC VALVE AV Area (Vmax):    2.73 cm     PV Vmax:       0.73 m/s AV Area (Vmean):   2.78 cm     PV Peak grad:  2.1 mmHg AV Area (VTI):     3.11 cm AV Vmax:           148.00 cm/s AV Vmean:          88.600 cm/s AV VTI:            0.264 m AV Peak Grad:      8.8 mmHg AV Mean Grad:      4.0 mmHg LVOT Vmax:         82.30 cm/s LVOT Vmean:        50.100 cm/s LVOT VTI:          0.167 m LVOT/AV VTI ratio: 0.63  AORTA Ao Root diam: 4.00 cm Ao Asc diam:  4.20 cm MITRAL VALVE MV Area (PHT): 7.37 cm     SHUNTS MV Area VTI:   3.18 cm     Systemic VTI:  0.17 m MV Peak grad:  7.5 mmHg     Systemic Diam: 2.50 cm MV Mean grad:  4.0 mmHg MV Vmax:       1.37 m/s MV Vmean:      86.7 cm/s MV Decel Time: 103 msec MV E velocity: 115.00 cm/s MV A velocity: 133.00 cm/s MV E/A ratio:  0.86 Toribio Fuel MD Electronically signed by Toribio Fuel MD Signature Date/Time: 11/19/2023/8:37:51 PM    Final    CT Angio Chest/Abd/Pel for Dissection W and/or Wo Contrast Result Date: 11/18/2023 CLINICAL DATA:  Chest pain, abdominal pain EXAM: CT ANGIOGRAPHY CHEST, ABDOMEN AND PELVIS TECHNIQUE: Non-contrast CT of the chest was initially obtained. Multidetector CT imaging through the chest, abdomen  and pelvis was performed using the standard protocol during bolus administration of intravenous contrast. Multiplanar reconstructed images and MIPs were obtained and reviewed to evaluate the  vascular anatomy. RADIATION DOSE REDUCTION: This exam was performed according to the departmental dose-optimization program which includes automated exposure control, adjustment of the mA and/or kV according to patient size and/or use of iterative reconstruction technique. CONTRAST:  75mL OMNIPAQUE IOHEXOL 350 MG/ML SOLN COMPARISON:  10/29/2023 FINDINGS: CTA CHEST FINDINGS Cardiovascular: 4.1 cm ascending thoracic aortic aneurysm. No evidence of thoracic aortic dissection. There is diffuse aortic atherosclerosis. Great vessels are widely patent. The heart is unremarkable without pericardial effusion. There is extensive atherosclerosis of the coronary vasculature. Postsurgical changes from prior CABG. There is technically adequate opacification of the central pulmonary vasculature. No evidence of central or segmental pulmonary emboli. Mediastinum/Nodes: No enlarged mediastinal, hilar, or axillary lymph nodes. Thyroid gland, trachea, and esophagus demonstrate no significant findings. Lungs/Pleura: No acute airspace disease, effusion, or pneumothorax. Upper lobe predominant emphysema. Stable bibasilar scarring. Stable 7 mm left lower lobe nodule image 143/8, benign given long-term stability since at least 2020. No specific follow-up recommended. Central airways are patent. Musculoskeletal: No acute or destructive bony abnormalities. Right shoulder arthroplasty. Reconstructed images demonstrate no additional findings. Review of the MIP images confirms the above findings. CTA ABDOMEN AND PELVIS FINDINGS VASCULAR Aorta: Normal caliber aorta without aneurysm, dissection, vasculitis or significant stenosis. Diffuse atherosclerosis. Celiac: Patent without evidence of aneurysm, dissection, vasculitis or significant stenosis. SMA: Patent without evidence of aneurysm, dissection, vasculitis or significant stenosis. Renals: There is significant atherosclerosis at the origin of the bilateral renal arteries, right greater than  left. On the right, estimated stenosis 50-70%, and on the left estimated less than 50%. No aneurysm, dissection, vasculitis, or fibromuscular dysplasia. IMA: Patent without evidence of aneurysm, dissection, vasculitis or significant stenosis. Inflow: Patent without evidence of aneurysm, dissection, vasculitis or significant stenosis. Veins: No obvious venous abnormality within the limitations of this arterial phase study. Review of the MIP images confirms the above findings. NON-VASCULAR Hepatobiliary: No focal liver abnormality is seen. Status post cholecystectomy. No biliary dilatation. Pancreas: Unremarkable. No pancreatic ductal dilatation or surrounding inflammatory changes. Spleen: Normal in size without focal abnormality. Adrenals/Urinary Tract: There is a 4 mm nonobstructing calculus at the right UVJ. There are 3 additional punctate less than 3 mm nonobstructing right renal calculi. No left-sided calculi or obstruction. Mild bilateral renal cortical thinning. The adrenals and bladder are unremarkable. Stomach/Bowel: No bowel obstruction or ileus. No bowel wall thickening or inflammatory change. Lymphatic: No pathologic adenopathy. Reproductive: Prostate is unremarkable. Other: No free fluid or free intraperitoneal gas. No abdominal wall hernia. Musculoskeletal: Right hip arthroplasty. No acute or destructive bony abnormalities. Reconstructed images demonstrate no additional findings. Review of the MIP images confirms the above findings. IMPRESSION: Vascular: 1. 4.1 cm ascending thoracic aortic aneurysm. Recommend annual imaging followup by CTA or MRA. This recommendation follows 2010 ACCF/AHA/AATS/ACR/ASA/SCA/SCAI/SIR/STS/SVM Guidelines for the Diagnosis and Management of Patients with Thoracic Aortic Disease. Circulation. 2010; 121: Z733-z630. Aortic aneurysm NOS (ICD10-I71.9) 2. No evidence of aortic dissection. 3. No evidence of pulmonary embolus. 4. 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 5.  Aortic Atherosclerosis (ICD10-I70.0). Nonvascular: 1. Nonobstructing 4 mm right UVJ calculus. Other punctate nonobstructing right renal calculi as above. 2.  Emphysema (ICD10-J43.9). Electronically Signed   By: Ozell Daring M.D.   On: 11/18/2023 15:14   DG Chest 2 View Result Date: 11/18/2023 EXAM: 2 VIEW(S) XRAY OF THE CHEST 11/18/2023  01:28:00 PM COMPARISON: 01/03/2022. CLINICAL HISTORY: Chest pain. FINDINGS: LUNGS AND PLEURA: Subsegmental linear atelectasis vs scarring at the left lung base. No focal pulmonary opacity. No overt pulmonary edema. No pleural effusion. No pneumothorax. HEART AND MEDIASTINUM: No acute abnormality of the cardiac and mediastinal silhouettes. Aortic atherosclerosis. Prior median sternotomy and CABG. BONES AND SOFT TISSUES: Right reverse shoulder arthroplasty. Diffuse osseous demineralization. IMPRESSION: 1. Subsegmental linear atelectasis vs scarring at the left lung base. Otherwise, no acute cardiopulmonary findings. 2. Aortic atherosclerosis. Electronically signed by: Shahmeer Marcelino MD 11/18/2023 02:01 PM EDT RP Workstation: [id number]    Cardiac Studies   Echo and cardiac cath  Patient Profile     83 y.o. male presented with NSTEMI, now status post PCI  Assessment & Plan  NSTEMI CAD s/p CABG Hypertension  Hyperlipidemia  Heart failure with improved ejection fraction    He status post PCI.  Currently on aspirin  81 mg daily with Plavix 75 mg daily he will ideally be on his dual antiplatelet therapy for 12 months.  He does have statin intolerance currently on Zetia  he will benefit from PCSK9 inhibitors which will be addressed in the outpatient setting.  He follows with Dr. Monetta in the [facility] office. LDL goal less than 55. Blood pressure at target From a cardiovascular standpoint he can be discharged to home    For questions or updates, please contact [facility] Please consult [email or web address] for contact info under  Cardiology/STEMI.      Signed, Delbra Zellars, DO  11/20/2023, 9:35 AM

## 2023-11-20 NOTE — Progress Notes (Signed)
 CARDIAC REHAB PHASE I   PRE:  Rate/Rhythm: 81 SR    BP: sitting 128/63    SpO2: 100 RA  MODE:  Ambulation: 400 ft   POST:  Rate/Rhythm: 95 SR    BP: sitting 143/85     SpO2:    Pt tolerated well, no c/o, eager for d/c.  Discussed with pt stent, restrictions, Plavix, exercise, NTG, and CRPII. Pt receptive. Will refer to [facility] CRPII however pt sts he is not interested. Likes to exercise at home. [id number]  Aliene Aris BS, ACSM-CEP 11/20/2023 9:54 AM

## 2023-11-29 DIAGNOSIS — R7303 Prediabetes: Secondary | ICD-10-CM | POA: Diagnosis not present

## 2023-11-29 DIAGNOSIS — K219 Gastro-esophageal reflux disease without esophagitis: Secondary | ICD-10-CM | POA: Diagnosis not present

## 2023-11-29 DIAGNOSIS — E782 Mixed hyperlipidemia: Secondary | ICD-10-CM | POA: Diagnosis not present

## 2023-11-29 DIAGNOSIS — Z0000 Encounter for general adult medical examination without abnormal findings: Secondary | ICD-10-CM | POA: Diagnosis not present

## 2023-11-29 DIAGNOSIS — Z23 Encounter for immunization: Secondary | ICD-10-CM | POA: Diagnosis not present

## 2023-11-29 DIAGNOSIS — E039 Hypothyroidism, unspecified: Secondary | ICD-10-CM | POA: Diagnosis not present

## 2024-01-26 ENCOUNTER — Telehealth: Payer: Self-pay | Admitting: Cardiology

## 2024-01-26 NOTE — Telephone Encounter (Signed)
 Pt would like a c/b regarding hospital stay and f/u. Please advise

## 2024-01-31 DIAGNOSIS — E079 Disorder of thyroid, unspecified: Secondary | ICD-10-CM | POA: Insufficient documentation

## 2024-01-31 DIAGNOSIS — E559 Vitamin D deficiency, unspecified: Secondary | ICD-10-CM | POA: Insufficient documentation

## 2024-01-31 NOTE — Progress Notes (Signed)
 " Cardiology Office Note:    Date:  02/01/2024   ID:  Jacob Weber, DOB February 10, 1940, MRN [id number]  PCP:  Jacob Lamar LITTIE, MD  Cardiologist:  Jacob Leiter, MD    Referring MD: Jacob Lamar LITTIE, MD    ASSESSMENT:    1. Coronary artery disease involving native coronary artery of native heart with angina pectoris   2. Chronic heart failure with preserved ejection fraction (HCC)   3. Aneurysm of ascending aorta without rupture   4. Mixed hyperlipidemia   5. Essential hypertension   6. Statin intolerance    PLAN:    In order of problems listed above:  Jacob Weber is doing well following remote CABG and more recent PCI and stent October 2025 [place]  Heart Association class I Continue his dual antiplatelet therapy at least a year and him inclined to keep him on clopidogrel  afterwards with repeat revascularization. Heart failure is nicely compensated continue his current loop diuretic Will need to consider repeat CT in about a year Well-controlled continue his beta-blocker Continue Zetia  and try very low-dose intermittent pravastatin  to intensify lipid-lowering therapy   Next appointment: October 2026   Medication Adjustments/Labs and Tests Ordered: Current medicines are reviewed at length with the patient today.  Concerns regarding medicines are outlined above.  No orders of the defined types were placed in this encounter.  No orders of the defined types were placed in this encounter.    History of Present Illness:    Jacob Weber is a 84 y.o. male with a hx of CAD with remote CABG 1997 heart failure low normal ejection fraction 50 to 55% dyslipidemia with statin intolerance per tensive heart disease with heart failure COPD lung nodule moderate large been ascending aorta 4 m last seen 10/23/2022.  In the context of acute coronary syndrome left heart catheterization performed in October with PCI scoring balloon angioplasty of the sequential graft to PDA posterolateral branch and  subsequent stent.  Left thoracic artery was patent in LAD and the vein graft to the obtuse marginal and LAD diagonal.  Echocardiogram at that time showed EF mildly reduced 40 to 45% right ventricle was dilated but normal systolic function left atrium is mildly enlarged aortic valve was sclerotic and calcified without stenosis  Compliance with diet, lifestyle and medications: Yes  Since PCI and stent he has had no angina edema shortness of breath palpitation or syncope He inquires about clopidogrel  he needs to stay on at least 1 year following his PCI He needs more intensive lipid-lowering therapy and he is agreed to try a low-dose of low intensity statin along with Zetia .  He declines taking bempedoic acid or injectables. Most recent labs 11/18/2023 LDL 107 cholesterol 168 hemoglobin 13.4 creatinine 1.13 Past Medical History:  Diagnosis Date   Acquired hallux rigidus of right foot 10/09/2020   Acquired hypothyroidism 03/13/2015   Acute bronchitis due to other specified organisms 04/22/2016   Acute idiopathic gout of left foot 11/02/2019   Acute respiratory failure with hypoxia (HCC) 01/04/2022   Postoperative.  Will recommend IS and will wean from oxygen.     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 MPS 08/11/2010 with  EF 56% and mild apical and septal iscjhemia 2. Cath 03/17/2011 with CTO of LAD and RCA, LTA is patent to D, SVG is patent to the RCA, SVG to LAD is chronically occluded, EF 58%   Chronic cough 06/23/2016   Overview:  Onset 2015.  CT chest mild changes.    Last Assessment & Plan:  Here for evaluation of his upper airway from pulmonology due  to chronic cough. Recently had his PPI increased to twice a day and was started on an inhaler.  He has noticed improvement in his cough, but it still bothersome.  No ACE inhibitor.  Cough seems to be much worse in the spring. EXAM by indirect laryngoscopy shows normal true vocal cords move well on phonation.  3+ edema of the arytenoid. PLAN: Reassured his larynx looks okay.  Mild arytenoid edema suggestive of reflux-induced irritation.  I agree with everything that has been done.  Let us  see how he does with his new inhaler twice a day PPI therapy for the month of June.  If he still persists with a bothersome cough, he will call to set up allergy testing.  Call sooner if symptoms are worsening.   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 2015: onset Formatting of this note might be different from the original. 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 11/18/2021 : left post     Neuralgia 10/30/2020   Formatting of this note might be different from the original. 10/30/2020: left preauricular Formatting of this note might be different from the original. 10/30/2020: left  preauricular     NSTEMI (non-ST elevated myocardial infarction) (HCC) 11/18/2023   Plantar fasciitis of left foot 06/06/2020   Pneumonitis 06/10/2016   Overview:  2018   Prediabetes 11/18/2021   Formatting of this note might be different from the original. 09/2021: 121/5.8 Formatting of this note might be different from the original. 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 06/14/2020 : right 5th toe Formatting of this note might be different from the original. 06/14/2020 : right 5th toe     Solitary pulmonary nodule 06/11/2016   Overview:  09/10/16: CT 9 mm LLL, stable on f/u - recheck February 2019   Thyroid disease    Toenail fungus 05/18/2017  Formatting of this note might be different from the original. 2019: Formatting of this note might be different from the original. 2019:     Trigeminal neuralgia 03/29/2016   Tumor of soft tissue of neck 02/11/2022    Current Medications: Active Medications[1]    EKGs/Labs/Other Studies Reviewed:    The following studies were reviewed today:  Cardiac Studies & Procedures   ______________________________________________________________________________________________ CARDIAC CATHETERIZATION  CARDIAC CATHETERIZATION 11/19/2023  Conclusion Images from the original result were not included.    -------------------------------- NATIVE CORONARIES -------------------------------   Ost LAD to Prox LAD lesion is 50% stenosed. Prox LAD to Mid LAD lesion is 100% stenosed.   Mid Cx lesion is 60% stenosed.  Proximal 1st Mrg branch is patent but diffusely diseased and calcified with a severe kink  in what appears to be a previous anastomosis site for graft.   Ost RCA to Dist RCA lesion is 100% stenosed with 100% stenosed side branch in RPAV.   -------------------------------- GRAFTS -------------------------------   LIMA-Diag(LAD) graft was visualized by angiography and is normal in caliber. -The graft appears to the very proximal portion of the diagonal branch with retrograde filling of the LAD. The graft exhibits no disease.   SVG-OM & SVG-LAD grafts were not visualized due to known occlusion.  Both with 100% ostial stenosis.SABRA   Seq SVG- RPDA -RPL graft was visualized by angiography and is large. Mid Graft lesion before RPDA is 99% stenosed.  (CULPRIT LESION)   Scoring balloon angioplasty was performed using a BALLOON SCOREFLEX 4.0X15, followed by successful placement of a STENT ONYX FRONTIER 4.0X18 deployed to 4.3 mm (postdilated with 4.5 mm x 15 mm [place] balloon to high atmospheres). Post intervention, there is a 0% residual stenosis.  (TIMI-3 flow maintained)   ----------------------- HEMODYNAMICS ---------------------------------   The aortic valve is not crossed-was difficult to manipulate the catheter to allow crossing of the valve.  Diagnostic Dominance: Right      Intervention  PLAN OF CARE   Admit to monitor inpatient. In the absence of any other complications or medical issues, we expect the patient to be ready for discharge from an interventional cardiology perspective on 11/20/2023.   Recommend uninterrupted dual antiplatelet therapy with Aspirin  81mg  daily and Clopidogrel  75mg  daily for a minimum of 6 months (stable ischemic heart disease-Class I recommendation).   Would continue Plavix  monotherapy to complete least 1 year if not additional second year.     Alm MICAEL Clay, MD, MS Alm Clay, M.D., M.S. Interventional Cardiologist [facility] Pager # [phone]  Findings Coronary Findings Diagnostic  Dominance: Right  Left Main Vessel was injected.  Vessel is normal in caliber. There is mild diffuse disease throughout the vessel. The vessel is calcified.  Left Anterior Descending Ost LAD to Prox LAD lesion is 50% stenosed. Prox LAD to Mid LAD lesion is 100% stenosed. The lesion is chronically occluded. The lesion is severely calcified.  First Diagonal Branch Vessel is small in size.  Left Circumflex Mid Cx lesion is 60% stenosed.  First Obtuse Marginal Branch There is severe disease in the vessel.  Second Obtuse Marginal Branch Vessel is small in size.  Right Coronary Artery Vessel was not injected. Known to be occluded The vessel is severely calcified. Ost RCA to Dist RCA lesion is 100% stenosed with 100% stenosed side branch in RPAV. The lesion is chronically occluded. The lesion is severely calcified.  Right Posterior Descending Artery Vessel is small in size.  First Right Posterolateral Branch Collaterals 1st RPL filled  by collaterals from 3rd Diag.  Second Right Posterolateral Branch Vessel is large in size.  Saphenous Graft To Dist LAD SVG graft was not visualized due to known occlusion.  SVG-LAD known to be occluded Origin lesion is 100% stenosed.  Saphenous Graft To 1st Mrg SVG graft was not visualized due to known occlusion.  ?  SVG-OM known to be occluded Origin lesion is 100% stenosed.  Sequential Jump Graft Graft To RPDA, 2nd RPL Seq SVG- RPDA -RPL graft was visualized by angiography and is large. Very large Mid Graft lesion before RPDA  is 99% stenosed. Vessel is the culprit lesion. The lesion is type C. Mid body of graft The lesion is moderately calcified. Fibrotic  LIMA LIMA Graft To 2nd Diag LIMA graft was visualized by angiography and is normal in caliber.  The graft exhibits no disease. Per report - LIMA-Diag -however it appears that the graft is the proximal portion of the diagonal and retrograde fills the LAD.  Intervention  Mid Graft lesion before RPDA (Sequential Jump Graft Graft To RPDA,  2nd RPL) Angioplasty Lesion length:  15 mm. CATH LAUNCHER 59F AR1 guide catheter was inserted. WIRE ASAHI PROWATER 180CM guidewire used to cross lesion. Scoring balloon angioplasty was performed using a BALLOON SCOREFLEX 4.0X15. Maximum pressure: 20 atm. Inflation time: 20 sec.  A second ballloon was used, using a standard  BALLOON TAKERU 2.0X12. Maximum pressure:  12 atm. Inflation time:  20 sec.  A third ballloon was used, using a non-compliant BALLOON SAPPHIRE NC24 4.5X12. Maximum pressure:  20 atm.  Inflation time:  20 sec. Distal protection was not used due to the severity of the stenosis.  I was not able to advance a standard 3 mm balloon into the lesion before predilated with a Takeru 2.0 balloon. Stent Lesion length:  15 mm. Lesion crossed with guidewire using a WIRE HI TORQ BMW 190CM. Pre-stent angioplasty was performed using a BALLOON EMERGE MR 3.0X12. Maximum pressure:  16 atm. Inflation time:  20 sec. After the 2.0 mm balloon, A drug-eluting stent was successfully placed using a STENT ONYX FRONTIER 4.0X18. Maximum pressure: 20 atm. Inflation time: 30 sec. Stent strut is well apposed. Deployed to 4.3 mm Post-stent angioplasty was performed using a BALLOON SAPPHIRE NC24 4.5X12. Maximum pressure:  20 atm. Inflation time:  30 sec. Focusing on the tightest segment WIRE HI TORQ BMW 190CM used for buddy wire to allow for stent advancement. Post-Intervention Lesion Assessment The intervention was successful. Pre-interventional TIMI flow is 3. Post-intervention TIMI flow is 3. Treated lesion length:  18 mm. No complications occurred at this lesion. There is a 0% residual stenosis post intervention.     ECHOCARDIOGRAM  ECHOCARDIOGRAM COMPLETE 11/19/2023  Narrative ECHOCARDIOGRAM REPORT    Patient Name:   Jacob Weber Date of Exam: 11/19/2023 Medical Rec #:  [id number]       Height:       74.0 in Accession #:    [id number]      Weight:       175.2 lb Date of Birth:  02-04-1940       BSA:           2.055 m Patient Age:    83 years        BP:           115/68 mmHg Patient Gender: M               HR:           71  bpm. Exam Location:  Inpatient  Procedure: 2D Echo, Cardiac Doppler, Color Doppler and Intracardiac Opacification Agent (Both Spectral and Color Flow Doppler were utilized during procedure).  Indications:    Chest Pain R07.9  History:        Patient has prior history of Echocardiogram examinations, most recent 08/21/2019. CHF and Cardiomyopathy; Risk Factors:Hypertension. H/O Ascending AO aneurysm, NSTEMI, Hyperlipidemia.  Sonographer:    BERNARDA ROCKS Referring Phys: [id number] ALEXANDER B MELVIN  IMPRESSIONS   1. Left ventricular ejection fraction, by estimation, is 40 to 45%. The left ventricle has mildly decreased function. The left ventricle demonstrates global hypokinesis. The left ventricular internal cavity size was mildly dilated. Left ventricular diastolic parameters are indeterminate. 2. Right ventricular systolic function is normal. The right ventricular size is mildly enlarged. 3. Left atrial size was mildly dilated. 4. The mitral valve is degenerative. No evidence of mitral valve regurgitation. No evidence of mitral stenosis. 5. The aortic valve is tricuspid. There is moderate calcification of the aortic valve. Aortic valve regurgitation is not visualized. Aortic valve sclerosis/calcification is present, without any evidence of aortic stenosis. 6. Aortic dilatation noted. There is mild dilatation of the aortic root, measuring 40 mm. There is mild dilatation of the ascending aorta, measuring 42 mm. 7. The inferior vena cava is normal in size with greater than 50% respiratory variability, suggesting right atrial pressure of 3 mmHg.  FINDINGS Left Ventricle: Left ventricular ejection fraction, by estimation, is 40 to 45%. The left ventricle has mildly decreased function. The left ventricle demonstrates global hypokinesis. Definity  contrast agent was given IV  to delineate the left ventricular endocardial borders. The left ventricular internal cavity size was mildly dilated. There is no left ventricular hypertrophy. Left ventricular diastolic parameters are indeterminate.  Right Ventricle: The right ventricular size is mildly enlarged. No increase in right ventricular wall thickness. Right ventricular systolic function is normal.  Left Atrium: Left atrial size was mildly dilated.  Right Atrium: Right atrial size was normal in size.  Pericardium: There is no evidence of pericardial effusion.  Mitral Valve: The mitral valve is degenerative in appearance. There is moderate thickening of the mitral valve leaflet(s). No evidence of mitral valve regurgitation. No evidence of mitral valve stenosis. MV peak gradient, 7.5 mmHg. The mean mitral valve gradient is 4.0 mmHg.  Tricuspid Valve: The tricuspid valve is normal in structure. Tricuspid valve regurgitation is trivial. No evidence of tricuspid stenosis.  Aortic Valve: The aortic valve is tricuspid. There is moderate calcification of the aortic valve. Aortic valve regurgitation is not visualized. Aortic valve sclerosis/calcification is present, without any evidence of aortic stenosis. Aortic valve mean gradient measures 4.0 mmHg. Aortic valve peak gradient measures 8.8 mmHg. Aortic valve area, by VTI measures 3.11 cm.  Pulmonic Valve: The pulmonic valve was normal in structure. Pulmonic valve regurgitation is not visualized. No evidence of pulmonic stenosis.  Aorta: Aortic dilatation noted. There is mild dilatation of the aortic root, measuring 40 mm. There is mild dilatation of the ascending aorta, measuring 42 mm.  Venous: The inferior vena cava is normal in size with greater than 50% respiratory variability, suggesting right atrial pressure of 3 mmHg.  IAS/Shunts: No atrial level shunt detected by color flow Doppler.   LEFT VENTRICLE PLAX 2D LVIDd:         5.50 cm LVIDs:         3.70 cm LV  PW:         0.80 cm LV IVS:  1.00 cm LVOT diam:     2.50 cm LV SV:         82 LV SV Index:   40 LVOT Area:     4.91 cm  LV Volumes (MOD) LV vol d, MOD A2C: 106.0 ml LV vol d, MOD A4C: 148.5 ml LV vol s, MOD A2C: 75.1 ml LV vol s, MOD A4C: 75.4 ml LV SV MOD A2C:     30.9 ml LV SV MOD A4C:     148.5 ml LV SV MOD BP:      50.6 ml  RIGHT VENTRICLE          IVC RV Basal diam:  3.80 cm  IVC diam: 1.90 cm TAPSE (M-mode): 1.6 cm  LEFT ATRIUM           Index        RIGHT ATRIUM           Index LA diam:      3.90 cm 1.90 cm/m   RA Area:     14.40 cm LA Vol (A2C): 66.5 ml 32.37 ml/m  RA Volume:   31.40 ml  15.28 ml/m LA Vol (A4C): 58.8 ml 28.62 ml/m AORTIC VALVE                    PULMONIC VALVE AV Area (Vmax):    2.73 cm     PV Vmax:       0.73 m/s AV Area (Vmean):   2.78 cm     PV Peak grad:  2.1 mmHg AV Area (VTI):     3.11 cm AV Vmax:           148.00 cm/s AV Vmean:          88.600 cm/s AV VTI:            0.264 m AV Peak Grad:      8.8 mmHg AV Mean Grad:      4.0 mmHg LVOT Vmax:         82.30 cm/s LVOT Vmean:        50.100 cm/s LVOT VTI:          0.167 m LVOT/AV VTI ratio: 0.63  AORTA Ao Root diam: 4.00 cm Ao Asc diam:  4.20 cm  MITRAL VALVE MV Area (PHT): 7.37 cm     SHUNTS MV Area VTI:   3.18 cm     Systemic VTI:  0.17 m MV Peak grad:  7.5 mmHg     Systemic Diam: 2.50 cm MV Mean grad:  4.0 mmHg MV Vmax:       1.37 m/s MV Vmean:      86.7 cm/s MV Decel Time: 103 msec MV E velocity: 115.00 cm/s MV A velocity: 133.00 cm/s MV E/A ratio:  0.86  Toribio Fuel MD Electronically signed by Toribio Fuel MD Signature Date/Time: 11/19/2023/8:37:51 PM    Final          ______________________________________________________________________________________________          Recent Labs: 11/18/2023: ALT 16; B Natriuretic Peptide 125.1; Magnesium 2.2 11/20/2023: BUN 17; Creatinine, Ser 1.13; Hemoglobin 13.4; Platelets 126; Potassium 3.8;  Sodium 135  Recent Lipid Panel    Component Value Date/Time   CHOL 168 11/18/2023 2332   TRIG 104 11/18/2023 2332   HDL 40 (L) 11/18/2023 2332   CHOLHDL 4.2 11/18/2023 2332   VLDL 21 11/18/2023 2332   LDLCALC 107 (H) 11/18/2023 2332    Physical Exam:    VS:  BP 124/60   Pulse 62   Ht 6' 2 (1.88 m)   Wt 179 lb 6 oz (81.4 kg)   SpO2 100%   BMI 23.03 kg/m     Wt Readings from Last 3 Encounters:  02/01/24 179 lb 6 oz (81.4 kg)  11/19/23 177 lb 11.1 oz (80.6 kg)  10/12/23 183 lb 3.2 oz (83.1 kg)     GEN:  Well nourished, well developed in no acute distress HEENT: Normal NECK: No JVD; No carotid bruits LYMPHATICS: No lymphadenopathy CARDIAC: RRR, no murmurs, rubs, gallops RESPIRATORY:  Clear to auscultation without rales, wheezing or rhonchi  ABDOMEN: Soft, non-tender, non-distended MUSCULOSKELETAL:  No edema; No deformity  SKIN: Warm and dry NEUROLOGIC:  Alert and oriented x 3 PSYCHIATRIC:  Normal affect    Signed, Jacob Leiter, MD  02/01/2024 2:41 PM    [facility]      [1]  Current Meds  Medication Sig   allopurinol  (ZYLOPRIM ) 100 MG tablet Take 100 mg by mouth at bedtime.   allopurinol  (ZYLOPRIM ) 300 MG tablet Take 300 mg by mouth in the morning.   aspirin  81 MG chewable tablet Chew 81 mg by mouth daily.   bimatoprost (LUMIGAN) 0.03 % ophthalmic solution Place 1 drop into both eyes at bedtime.   carvedilol  (COREG ) 25 MG tablet Take 0.5 tablets (12.5 mg total) by mouth 2 (two) times daily.   clopidogrel  (PLAVIX ) 75 MG tablet Take 1 tablet (75 mg total) by mouth daily with breakfast.   colchicine 0.6 MG tablet Take 0.6 mg by mouth 2 (two) times daily as needed (Gout).   Cyanocobalamin (VITAMIN B 12 PO) Take 1 tablet by mouth daily as needed (Supplement).   ezetimibe  (ZETIA ) 10 MG tablet Take 1 tablet (10 mg total) by mouth daily.   fluticasone (FLONASE) 50 MCG/ACT nasal spray Place 1 spray into both nostrils daily as needed for allergies  or rhinitis.   furosemide  (LASIX ) 40 MG tablet TAKE ONE TABLET BY MOUTH ONCE DAILY   levothyroxine  (SYNTHROID , LEVOTHROID) 100 MCG tablet Take 100 mcg by mouth daily.   nitroGLYCERIN (NITROSTAT) 0.4 MG SL tablet Place 0.4 mg under the tongue every 5 (five) minutes as needed for chest pain.   polyethylene glycol (MIRALAX  / GLYCOLAX ) 17 g packet Take 17 g by mouth daily as needed for moderate constipation.   potassium chloride  SA (KLOR-CON  M) 20 MEQ tablet TAKE ONE TABLET BY MOUTH ONCE DAILY   VITAMIN D  PO Take 1 tablet by mouth at bedtime.   "

## 2024-02-01 ENCOUNTER — Encounter: Payer: Self-pay | Admitting: Cardiology

## 2024-02-01 ENCOUNTER — Ambulatory Visit: Attending: Cardiology | Admitting: Cardiology

## 2024-02-01 ENCOUNTER — Telehealth: Payer: Self-pay

## 2024-02-01 VITALS — BP 124/60 | HR 62 | Ht 74.0 in | Wt 179.4 lb

## 2024-02-01 DIAGNOSIS — Z789 Other specified health status: Secondary | ICD-10-CM

## 2024-02-01 DIAGNOSIS — I5032 Chronic diastolic (congestive) heart failure: Secondary | ICD-10-CM | POA: Diagnosis not present

## 2024-02-01 DIAGNOSIS — I25119 Atherosclerotic heart disease of native coronary artery with unspecified angina pectoris: Secondary | ICD-10-CM

## 2024-02-01 DIAGNOSIS — I10 Essential (primary) hypertension: Secondary | ICD-10-CM | POA: Diagnosis not present

## 2024-02-01 DIAGNOSIS — E782 Mixed hyperlipidemia: Secondary | ICD-10-CM | POA: Diagnosis not present

## 2024-02-01 DIAGNOSIS — I7121 Aneurysm of the ascending aorta, without rupture: Secondary | ICD-10-CM | POA: Diagnosis not present

## 2024-02-01 MED ORDER — CLOPIDOGREL BISULFATE 75 MG PO TABS: 75.0000 mg | ORAL_TABLET | Freq: Every day | ORAL | 3 refills | Status: AC

## 2024-02-01 MED ORDER — PRAVASTATIN SODIUM 20 MG PO TABS: 20.0000 mg | ORAL_TABLET | ORAL | 3 refills | Status: AC

## 2024-02-01 NOTE — Telephone Encounter (Signed)
 Patient left the office before his after visit summary could be reviewed with him. Attempted to call the patient to review the AVS with him. He did not answer the phone and a message was left for the patient to call back.

## 2024-02-01 NOTE — Patient Instructions (Signed)
 Medication Instructions:  Your physician has recommended you make the following change in your medication:   START: Pravastatin  20 mg two times a week.  *If you need a refill on your cardiac medications before your next appointment, please call your pharmacy*  Lab Work: Your physician recommends that you return for lab work in:   Labs in 2 months: Lipids and Apo B  If you have labs (blood work) drawn today and your tests are completely normal, you will receive your results only by: MyChart Message (if you have MyChart) OR A paper copy in the mail If you have any lab test that is abnormal or we need to change your treatment, we will call you to review the results.  Testing/Procedures: None  Follow-Up: At [facility], you and your health needs are our priority.  As part of our continuing mission to provide you with exceptional heart care, our providers are all part of one team.  This team includes your primary Cardiologist (physician) and Advanced Practice Providers or APPs (Physician Assistants and Nurse Practitioners) who all work together to provide you with the care you need, when you need it.  Your next appointment:   9 month(s)  Provider:   Redell Leiter, MD    We recommend signing up for the patient portal called MyChart.  Sign up information is provided on this After Visit Summary.  MyChart is used to connect with patients for Virtual Visits (Telemedicine).  Patients are able to view lab/test results, encounter notes, upcoming appointments, etc.  Non-urgent messages can be sent to your provider as well.   To learn more about what you can do with MyChart, go to [email or web address].   Other Instructions None

## 2024-02-02 NOTE — Telephone Encounter (Signed)
 Pt returned call - please advise

## 2024-02-02 NOTE — Telephone Encounter (Signed)
 Returned patient's call at this time. Patient states he had stepped out of the room to go to the restroom and he stopped by the front desk and was told he was good to go, so he left and did not realize he needed his paperwork. Patient was provided with his discharge information via telephone. Patient states will return in March for repeat blood work.  Alan, RN

## 2024-02-04 ENCOUNTER — Ambulatory Visit: Payer: Self-pay | Admitting: Cardiology

## 2024-02-04 DIAGNOSIS — E785 Hyperlipidemia, unspecified: Secondary | ICD-10-CM

## 2024-02-04 LAB — LIPID PANEL
Chol/HDL Ratio: 4.3 ratio (ref 0.0–5.0)
Cholesterol, Total: 178 mg/dL (ref 100–199)
HDL: 41 mg/dL
LDL Chol Calc (NIH): 117 mg/dL — ABNORMAL HIGH (ref 0–99)
Triglycerides: 111 mg/dL (ref 0–149)
VLDL Cholesterol Cal: 20 mg/dL (ref 5–40)

## 2024-02-04 LAB — APOLIPOPROTEIN B: Apolipoprotein B: 93 mg/dL — ABNORMAL HIGH

## 2024-02-04 NOTE — Telephone Encounter (Signed)
 Results reviewed with Arland per DPR as per Dr. Leandrew note. Arland verbalized understanding and had no additional questions. Routed to PCP

## 2024-02-04 NOTE — Telephone Encounter (Signed)
 Daughter Lessie) wants a call back regarding patient having repeat lab work.

## 2024-02-28 ENCOUNTER — Other Ambulatory Visit: Payer: Self-pay | Admitting: Cardiology
